# Patient Record
Sex: Female | Born: 1950 | Race: White | Hispanic: No | Smoking: Never smoker
Health system: Southern US, Community
[De-identification: ages and names within clinical notes are randomized; demographics above are authoritative.]

## PROBLEM LIST (undated history)

## (undated) DIAGNOSIS — G4730 Sleep apnea, unspecified: Secondary | ICD-10-CM

## (undated) DIAGNOSIS — Z974 Presence of external hearing-aid: Secondary | ICD-10-CM

## (undated) DIAGNOSIS — J302 Other seasonal allergic rhinitis: Secondary | ICD-10-CM

## (undated) DIAGNOSIS — D649 Anemia, unspecified: Secondary | ICD-10-CM

## (undated) DIAGNOSIS — I10 Essential (primary) hypertension: Secondary | ICD-10-CM

## (undated) DIAGNOSIS — E039 Hypothyroidism, unspecified: Secondary | ICD-10-CM

## (undated) DIAGNOSIS — R7303 Prediabetes: Secondary | ICD-10-CM

## (undated) DIAGNOSIS — M810 Age-related osteoporosis without current pathological fracture: Secondary | ICD-10-CM

## (undated) DIAGNOSIS — Z8489 Family history of other specified conditions: Secondary | ICD-10-CM

## (undated) DIAGNOSIS — Z8719 Personal history of other diseases of the digestive system: Secondary | ICD-10-CM

## (undated) DIAGNOSIS — M719 Bursopathy, unspecified: Secondary | ICD-10-CM

## (undated) DIAGNOSIS — K219 Gastro-esophageal reflux disease without esophagitis: Secondary | ICD-10-CM

## (undated) DIAGNOSIS — R0981 Nasal congestion: Secondary | ICD-10-CM

## (undated) DIAGNOSIS — Q348 Other specified congenital malformations of respiratory system: Secondary | ICD-10-CM

## (undated) DIAGNOSIS — Z972 Presence of dental prosthetic device (complete) (partial): Secondary | ICD-10-CM

## (undated) DIAGNOSIS — M1990 Unspecified osteoarthritis, unspecified site: Secondary | ICD-10-CM

## (undated) DIAGNOSIS — C801 Malignant (primary) neoplasm, unspecified: Secondary | ICD-10-CM

## (undated) HISTORY — PX: COLONOSCOPY: SHX174

## (undated) HISTORY — DX: Bursopathy, unspecified: M71.9

## (undated) HISTORY — DX: Other specified congenital malformations of respiratory system: Q34.8

## (undated) HISTORY — DX: Hypothyroidism, unspecified: E03.9

## (undated) HISTORY — DX: Other seasonal allergic rhinitis: J30.2

## (undated) HISTORY — PX: ESOPHAGOGASTRODUODENOSCOPY: SHX1529

## (undated) HISTORY — DX: Prediabetes: R73.03

---

## 1988-07-26 HISTORY — PX: KNEE ARTHROSCOPY: SUR90

## 1998-09-30 ENCOUNTER — Other Ambulatory Visit: Admission: RE | Admit: 1998-09-30 | Discharge: 1998-09-30 | Payer: Self-pay | Admitting: Obstetrics & Gynecology

## 1999-05-06 ENCOUNTER — Other Ambulatory Visit: Admission: RE | Admit: 1999-05-06 | Discharge: 1999-05-06 | Payer: Self-pay | Admitting: Radiology

## 1999-06-08 ENCOUNTER — Other Ambulatory Visit: Admission: RE | Admit: 1999-06-08 | Discharge: 1999-06-08 | Payer: Self-pay | Admitting: Obstetrics & Gynecology

## 1999-07-27 HISTORY — PX: KNEE ARTHROSCOPY W/ ACL RECONSTRUCTION: SHX1858

## 2000-07-13 ENCOUNTER — Other Ambulatory Visit: Admission: RE | Admit: 2000-07-13 | Discharge: 2000-07-13 | Payer: Self-pay | Admitting: Obstetrics & Gynecology

## 2000-07-15 ENCOUNTER — Ambulatory Visit (HOSPITAL_BASED_OUTPATIENT_CLINIC_OR_DEPARTMENT_OTHER): Admission: RE | Admit: 2000-07-15 | Discharge: 2000-07-15 | Payer: Self-pay | Admitting: Orthopedic Surgery

## 2000-07-26 HISTORY — PX: KNEE ARTHROSCOPY: SHX127

## 2000-09-08 ENCOUNTER — Ambulatory Visit (HOSPITAL_BASED_OUTPATIENT_CLINIC_OR_DEPARTMENT_OTHER): Admission: RE | Admit: 2000-09-08 | Discharge: 2000-09-09 | Payer: Self-pay | Admitting: Orthopedic Surgery

## 2001-08-02 ENCOUNTER — Other Ambulatory Visit: Admission: RE | Admit: 2001-08-02 | Discharge: 2001-08-02 | Payer: Self-pay | Admitting: Obstetrics & Gynecology

## 2002-04-27 ENCOUNTER — Encounter: Payer: Self-pay | Admitting: Family Medicine

## 2002-04-27 ENCOUNTER — Encounter: Admission: RE | Admit: 2002-04-27 | Discharge: 2002-04-27 | Payer: Self-pay | Admitting: Family Medicine

## 2002-07-26 DIAGNOSIS — C801 Malignant (primary) neoplasm, unspecified: Secondary | ICD-10-CM

## 2002-07-26 HISTORY — DX: Malignant (primary) neoplasm, unspecified: C80.1

## 2002-07-26 HISTORY — PX: SHOULDER SURGERY: SHX246

## 2002-07-26 HISTORY — PX: BONE RESECTION: SHX897

## 2002-08-07 ENCOUNTER — Other Ambulatory Visit: Admission: RE | Admit: 2002-08-07 | Discharge: 2002-08-07 | Payer: Self-pay | Admitting: Obstetrics & Gynecology

## 2003-08-12 ENCOUNTER — Other Ambulatory Visit: Admission: RE | Admit: 2003-08-12 | Discharge: 2003-08-12 | Payer: Self-pay | Admitting: Obstetrics & Gynecology

## 2004-09-01 ENCOUNTER — Other Ambulatory Visit: Admission: RE | Admit: 2004-09-01 | Discharge: 2004-09-01 | Payer: Self-pay | Admitting: Obstetrics & Gynecology

## 2006-07-26 HISTORY — PX: ANKLE ARTHROSCOPY: SUR85

## 2010-04-02 ENCOUNTER — Encounter: Admission: RE | Admit: 2010-04-02 | Discharge: 2010-04-02 | Payer: Self-pay | Admitting: Obstetrics & Gynecology

## 2010-12-11 NOTE — Op Note (Signed)
[place]  Patient:    Stacey Coleman, Stacey Coleman                       MRN: [id number] Proc. Date: 09/08/00 Adm. Date:  [id number] Attending:  Colbert Ewing                           Operative Report  PREOPERATIVE DIAGNOSIS:  Right knee anterior cruciate ligament tear with anterolateral rotary instability.  Avascular necrosis of medial femoral condyle, status post debridement and drilling.  POSTOPERATIVE DIAGNOSIS:  Right knee anterior cruciate ligament tear with anterolateral rotary instability.  Avascular necrosis of medial femoral condyle, status post debridement and drilling.  Probable cartilage ingrowth over area of drilling, medial femoral condyle.  OPERATION PERFORMED:  Right knee examination under anesthesia, arthroscopy, assessment of medial femoral condyle.  Arthroscopic endoscopic anterior cruciate ligament reconstruction with patellar tendon allograft, bone-tendon-bone with bioabsorbable screw fixation.  Notchplasty.  SURGEON:  Loreta Ave, M.D.  ASSISTANT:  Arlys John D. Petrarca, P.A.-C.  ANESTHESIA:  General.  ESTIMATED BLOOD LOSS:  Minimal.  TOURNIQUET TIME:  One hour and 10 minutes.  SPECIMENS:  None.  CULTURES:  None.  COMPLICATIONS:  None.  DRESSING:  Soft compressive with knee immobilizer.  DESCRIPTION OF PROCEDURE:  The patient was brought to the operating room and placed on the operating table in supine position.  After adequate anesthesia had been obtained, the right knee examined.  Full motion.  Positive Lachman, positive drawer, positive pivot-shift.  Collaterals and posterior cruciate ligament stable.  Good patellofemoral tracking without crepitus, tethering or instability.  Tourniquet applied, prepped and draped in the usual sterile fashion. Exsanguinated with elevation and Esmarch.  Tourniquet inflated to 300 mmHg.  Three portals created.  One superolateral, one each medial and lateral parapatellar.   Inflow catheter introduced, knee distended. Arthroscope introduced.  Knee inspected.  Area of microfracture in the medial femoral condyle had filled in nicely with fibrocartilage.  No new chondral fragmentation.  Medial meniscus, lateral meniscus without change after previous meniscectomy.  ACL completely torn.  The stump of the ACL healed over the PCL.  Good patellofemoral tracking with only grade 2 chondromalacia. After all matters were assessed, a graft was prepared for 10 mm tunnels with bone pegs at either end, central portion of the graft tube with Vicryl and either end tagged with #5 Ethibond.  With the arthroscope in place, the ACL debrided.  Notchplasty with shaver and high speed bur.  Tibial guide inserted through the medial portal on the ACL footprint on the tibia.  The other end through a small vertical incision medial to the tibial tubercle.  K-wire driven and placement felt to be good.  Overdrilled with a 10 mm reamer. Debris cleared with a shaver.  Femoral guide inserted across the tibial tunnel notch and on the back cortex of the femur.  K-wire driven and overdrilled with the reamer for appropriate depth for the graft and the pegs.  Both tunnels assessed and found to be in good position.  Debris cleared from all recesses. Two pin passer inserted across both tunnels and out through a stab wound anterolateral thigh.  Nitinol wire brought into the medial portal and out through the femoral tunnel.  Graft attached with a two-pin passer, pulled in across the knee, seating the pegs well in the tibia and in the femur.  Fixed in the femur with a 9 x 25 bioabsorbable  screw which was placed over a nitinol wire parallel to the ____________ femur.  Excellent capturing and fixation. Nitinol wires and sutures removed.  Knee placed in 90 degrees of flexion, posterior drawer applied.  Graft pulled tightly and fixed into the tibial tunnel over a nitinol wire with another 9 x 25 bioabsorbable  screw.  At completion, excellent stability.  Full motion.  No impingement of the graft through full motion.  Negative Lachman, negative drawer.  Wounds were all irrigated.  All nitinol wire and sutures had been removed.  Wounds closed with subcutaneous and subcuticular Vicryl, injected with Marcaine.  Portals also closed with nylon.  Margins of the wound and knee injected with Marcaine. Sterile compressive dressing applied.  Tourniquet deflated and removed.  Knee immobilizer applied.  Anesthesia reversed.  Brought to recovery room. Tolerated surgery well.  No complications. DD:  09/08/00 TD:  09/08/00 Job: [id number]

## 2010-12-11 NOTE — Op Note (Signed)
[place]  Patient:    Stacey Coleman, Stacey Coleman                       MRN: [id number] Proc. Date: 07/15/00 Adm. Date:  [id number] Disc. Date: [id number] Attending:  Colbert Ewing                           Operative Report  PREOPERATIVE DIAGNOSIS:  Right knee spontaneous osteonecrosis, medial femoral condyle.  Torn anterior cruciate ligament.  POSTOPERATIVE DIAGNOSIS:  Right knee spontaneous osteonecrosis, medial femoral condyle.  Torn anterior cruciate ligament.  OPERATIVE PROCEDURE:  Right knee examination under anesthesia to assess anterior cruciate ligament instability.  Arthroscopy with debridement of area of osteonecrosis, medial femoral condyle, and treatment with multiple drilling/microfracturing.  SURGEON:  Loreta Ave, M.D.  ASSISTANT:  Arlys John D. Petrarca, P.A.-C.  ANESTHESIA:  General.  ESTIMATED BLOOD LOSS:  Minimal.  Tourniquet not employed.  SPECIMENS:  None.  CULTURES:  None.  COMPLICATIONS:  None.  DRESSINGS:  Soft compressive.  DESCRIPTION OF PROCEDURE:  The patient was brought to the operating room and after adequate anesthesia had been obtained, the right knee was examined. Positive Lachmans, positive drawer, and positive pivot shift.  Collaterals stable.  PCL stable.  Good patellar femoral stability with slightly lateral tracking but no tethering.  Tourniquet and leg holder applied.  Leg prepped and draped in the usual sterile fashion.  Three portals created:  one supralateral, one each medial and lateral parapatellar.  ______ introduced. Knee distended.  Arthroscope was introduced.  Knee inspected.  Patellar femoral joint and lateral tibial plateau had some Grade 2 changes.  Lateral tracking patellar femoral short without tethering.  Lateral meniscus intact. Lateral femoral condyle normal.  Marked stretching attenuation of ACL with a functional complete tear with instability.  PCL intact.  Reactive  synovitis anteriorly, debrided.  Medial component revealed a 1.5-cm lesion of full-thickness osteonecrosis with marked instability of osteochondral fragments within the bed.  The bed completely removed, and the cartilage debrided back to a stable surface at the margins.  Some Grade 2, mild Grade 3 chondromalacia on the back of the medial femoral condyle, also debrided smoothly.  Once the bed had been completely cleared down, it was treated with multiple microfracturing with the microfracturing set to produce bleeding and filling in of the lesion with fibrocartilage.  All debrided clear.  The entire knee examined.  No other significant findings appreciated.  Instruments were removed.  Portals in the knee were injected with Marcaine.  Portals were closed with #0 nylon.  Sterile compressive dressing was applied.  Anesthesia was reversed and brought to recovery room.  Tolerated the surgery well.  No complications. DD:  07/15/00 TD:  07/17/00 Job: [id number]

## 2011-03-10 ENCOUNTER — Other Ambulatory Visit: Payer: Self-pay | Admitting: Obstetrics & Gynecology

## 2011-03-10 DIAGNOSIS — Z1231 Encounter for screening mammogram for malignant neoplasm of breast: Secondary | ICD-10-CM

## 2011-04-05 ENCOUNTER — Ambulatory Visit
Admission: RE | Admit: 2011-04-05 | Discharge: 2011-04-05 | Disposition: A | Discharge status: Home / Self Care | Payer: BC Managed Care – PPO | Source: Ambulatory Visit | Attending: Obstetrics & Gynecology | Admitting: Obstetrics & Gynecology

## 2011-04-05 DIAGNOSIS — Z1231 Encounter for screening mammogram for malignant neoplasm of breast: Secondary | ICD-10-CM

## 2011-07-27 DIAGNOSIS — D649 Anemia, unspecified: Secondary | ICD-10-CM

## 2011-07-27 HISTORY — DX: Anemia, unspecified: D64.9

## 2012-02-29 ENCOUNTER — Other Ambulatory Visit: Payer: Self-pay | Admitting: Obstetrics & Gynecology

## 2012-02-29 DIAGNOSIS — Z1231 Encounter for screening mammogram for malignant neoplasm of breast: Secondary | ICD-10-CM

## 2012-04-06 ENCOUNTER — Ambulatory Visit
Admission: RE | Admit: 2012-04-06 | Discharge: 2012-04-06 | Disposition: A | Discharge status: Home / Self Care | Payer: BC Managed Care – PPO | Source: Ambulatory Visit | Attending: Obstetrics & Gynecology | Admitting: Obstetrics & Gynecology

## 2012-04-06 ENCOUNTER — Ambulatory Visit: Payer: BC Managed Care – PPO

## 2012-04-06 DIAGNOSIS — Z1231 Encounter for screening mammogram for malignant neoplasm of breast: Secondary | ICD-10-CM

## 2013-02-26 ENCOUNTER — Other Ambulatory Visit: Payer: Self-pay

## 2013-03-01 ENCOUNTER — Other Ambulatory Visit: Payer: Self-pay | Admitting: *Deleted

## 2013-12-04 ENCOUNTER — Other Ambulatory Visit: Payer: Self-pay | Admitting: Orthopedic Surgery

## 2013-12-04 DIAGNOSIS — M25562 Pain in left knee: Secondary | ICD-10-CM

## 2013-12-05 ENCOUNTER — Other Ambulatory Visit: Payer: Self-pay | Admitting: Physician Assistant

## 2013-12-07 ENCOUNTER — Encounter (HOSPITAL_BASED_OUTPATIENT_CLINIC_OR_DEPARTMENT_OTHER): Payer: Self-pay | Admitting: *Deleted

## 2013-12-07 NOTE — Progress Notes (Signed)
Will come in for bmet-ekg-had had knee surgeries here in past-to bring crutches

## 2013-12-08 ENCOUNTER — Ambulatory Visit
Admission: RE | Admit: 2013-12-08 | Discharge: 2013-12-08 | Disposition: A | Discharge status: Home / Self Care | Payer: BC Managed Care – PPO | Source: Ambulatory Visit | Attending: Orthopedic Surgery | Admitting: Orthopedic Surgery

## 2013-12-08 DIAGNOSIS — M25562 Pain in left knee: Secondary | ICD-10-CM

## 2013-12-10 ENCOUNTER — Encounter (HOSPITAL_BASED_OUTPATIENT_CLINIC_OR_DEPARTMENT_OTHER)
Admission: RE | Admit: 2013-12-10 | Discharge: 2013-12-10 | Disposition: A | Discharge status: Home / Self Care | Payer: BC Managed Care – PPO | Source: Ambulatory Visit | Attending: Orthopedic Surgery | Admitting: Orthopedic Surgery

## 2013-12-10 ENCOUNTER — Other Ambulatory Visit: Payer: Self-pay

## 2013-12-10 LAB — BASIC METABOLIC PANEL
BUN: 11 mg/dL (ref 6–23)
CO2: 26 mEq/L (ref 19–32)
Calcium: 9.6 mg/dL (ref 8.4–10.5)
Chloride: 105 mEq/L (ref 96–112)
Creatinine, Ser: 0.76 mg/dL (ref 0.50–1.10)
GFR calc Af Amer: >90 mL/min (ref >90)
GFR calc non Af Amer: 87 mL/min — ABNORMAL LOW (ref >90)
Glucose, Bld: 114 mg/dL — ABNORMAL HIGH (ref 70–99)
Potassium: 3.9 mEq/L (ref 3.7–5.3)
Sodium: 141 mEq/L (ref 137–147)

## 2013-12-12 NOTE — H&P (Signed)
[facility] [address] [place], [place] [address] [phone] A Division of [facility]  Ninetta Lights, M.D.   Robert A. Noemi Chapel, M.D.   Faythe Casa, M.D.   Johnny Bridge, M.D.   Almedia Balls, M.D Ernesta Amble. Percell Miller, M.D.  Joseph Pierini, M.D.  Lanier Prude, M.D.    Verner Chol, M.D. Mary L. Fenton Malling, PA-C  Kirstin A. Shepperson, PA-C  Josh [place], PA-C [place], [place]   RE: Stacey Coleman, Stacey Coleman                                [id number]      DOB: [birth year] PROGRESS NOTE: 12-04-13 [name] comes in for a new acute injury to her left knee.  She had climbed up on her bed, was reaching to adjust a drape.  A forced flexion twisting injury to her knee.  She had acute pain on her medial side.  The knee locked up and she could not straighten this out.  With time and maneuvering her leg she was eventually able to get this unlocked so that she could go into extension.  She then subsequently had a mild twist and the knee locked up again.  That took a number of hours before she was able to maneuver that and then straighten her knee out again.  She currently can bear weight.  She can come to full extension, but she is very hesitant to do anything.  Significant pain on the medial side.  No previous issues with this knee.   Remaining history is reviewed, updated and included in the chart.  Of note, she has extreme end stage changes in her right knee.  She is going to come down to knee replacement there sooner rather than later.  As she is still tolerating things on that side we have been waiting for it to be intolerable before proceeding.     EXAMINATION: General exam is outlined and included in the chart.  Specifically, she has an antalgic gait, both sides at this point in time.  Her right knee I can still get to full extension and better than 100 degrees of flexion.  Marked grating and crepitus throughout.  Stable  ligaments.  On the left she is exquisitely tender medial joint line.  Positive McMurray's.  I did not stress this forcefully because of the two episodes of locking.  Ligaments are stable.  2+ effusion.  Neurovascularly intact distally.    X-RAYS: Four view standing x-ray shows the extreme tricompartmental changes on the right.  On the left there is really a paucity of degenerative changes.    DISPOSITION:  In light of this dramatic episode of locking x 2, I think we need to assess her for a displaced bucket handle tear medial meniscus.  Therapy and injection at this point in time is futile based on these episodes of locking that lasted for hours.  MRI complete.  If in fact she has a bucket handle tear it is going to come down to arthroscopic assessment and debridement.  We have discussed that procedure, recovery and rehab, risks, benefits and complications in detail.  We are going to make a final decision about proceeding after I see her scan.  More than 25 minutes spent face-to-face covering all of this with her.  Again, we will be talking on the phone after her scan.  Ninetta Lights, M.D.   Electronically verified by Ninetta Lights, M.D. DFM:jjh D 12-04-13 T 12-05-13

## 2013-12-13 ENCOUNTER — Encounter (HOSPITAL_BASED_OUTPATIENT_CLINIC_OR_DEPARTMENT_OTHER)
Admission: RE | Disposition: A | Discharge status: Home / Self Care | Payer: Self-pay | Source: Ambulatory Visit | Attending: Orthopedic Surgery

## 2013-12-13 ENCOUNTER — Ambulatory Visit (HOSPITAL_BASED_OUTPATIENT_CLINIC_OR_DEPARTMENT_OTHER)
Admission: RE | Admit: 2013-12-13 | Discharge: 2013-12-13 | Disposition: A | Discharge status: Home / Self Care | Payer: BC Managed Care – PPO | Source: Ambulatory Visit | Attending: Orthopedic Surgery | Admitting: Orthopedic Surgery

## 2013-12-13 ENCOUNTER — Encounter (HOSPITAL_BASED_OUTPATIENT_CLINIC_OR_DEPARTMENT_OTHER): Payer: BC Managed Care – PPO | Admitting: Anesthesiology

## 2013-12-13 ENCOUNTER — Encounter (HOSPITAL_BASED_OUTPATIENT_CLINIC_OR_DEPARTMENT_OTHER): Payer: Self-pay | Admitting: Anesthesiology

## 2013-12-13 ENCOUNTER — Ambulatory Visit (HOSPITAL_BASED_OUTPATIENT_CLINIC_OR_DEPARTMENT_OTHER): Payer: BC Managed Care – PPO | Admitting: Anesthesiology

## 2013-12-13 DIAGNOSIS — K219 Gastro-esophageal reflux disease without esophagitis: Secondary | ICD-10-CM | POA: Insufficient documentation

## 2013-12-13 DIAGNOSIS — IMO0002 Reserved for concepts with insufficient information to code with codable children: Secondary | ICD-10-CM | POA: Insufficient documentation

## 2013-12-13 DIAGNOSIS — E039 Hypothyroidism, unspecified: Secondary | ICD-10-CM | POA: Insufficient documentation

## 2013-12-13 DIAGNOSIS — I10 Essential (primary) hypertension: Secondary | ICD-10-CM | POA: Insufficient documentation

## 2013-12-13 DIAGNOSIS — X500XXA Overexertion from strenuous movement or load, initial encounter: Secondary | ICD-10-CM | POA: Insufficient documentation

## 2013-12-13 HISTORY — DX: Hypothyroidism, unspecified: E03.9

## 2013-12-13 HISTORY — DX: Essential (primary) hypertension: I10

## 2013-12-13 HISTORY — PX: KNEE ARTHROSCOPY WITH MEDIAL MENISECTOMY: SHX5651

## 2013-12-13 HISTORY — DX: Age-related osteoporosis without current pathological fracture: M81.0

## 2013-12-13 HISTORY — DX: Presence of dental prosthetic device (complete) (partial): Z97.2

## 2013-12-13 HISTORY — DX: Unspecified osteoarthritis, unspecified site: M19.90

## 2013-12-13 HISTORY — DX: Presence of external hearing-aid: Z97.4

## 2013-12-13 HISTORY — DX: Gastro-esophageal reflux disease without esophagitis: K21.9

## 2013-12-13 LAB — POCT HEMOGLOBIN-HEMACUE: Hemoglobin: 13.8 g/dL (ref 12.0–15.0)

## 2013-12-13 SURGERY — ARTHROSCOPY, KNEE, WITH MEDIAL MENISCECTOMY
Anesthesia: General | Site: Knee | Laterality: Left

## 2013-12-13 MED ORDER — HYDROMORPHONE HCL PF 1 MG/ML IJ SOLN
0.2500 mg | INTRAMUSCULAR | Status: DC | PRN
  Administered 2013-12-13 (×2): 0.5 mg via INTRAVENOUS

## 2013-12-13 MED ORDER — OXYCODONE-ACETAMINOPHEN 5-325 MG PO TABS: 1.0000 | ORAL_TABLET | ORAL | Status: DC | PRN

## 2013-12-13 MED ORDER — LACTATED RINGERS IV SOLN
INTRAVENOUS | Status: DC
  Administered 2013-12-13 (×2): via INTRAVENOUS

## 2013-12-13 MED ORDER — BUPIVACAINE HCL (PF) 0.5 % IJ SOLN
INTRAMUSCULAR | Status: AC
  Filled 2013-12-13: qty 30

## 2013-12-13 MED ORDER — HYDROMORPHONE HCL PF 1 MG/ML IJ SOLN
INTRAMUSCULAR | Status: AC
  Filled 2013-12-13: qty 1

## 2013-12-13 MED ORDER — MIDAZOLAM HCL 2 MG/2ML IJ SOLN: 1.0000 mg | INTRAMUSCULAR | Status: DC | PRN

## 2013-12-13 MED ORDER — OXYCODONE HCL 5 MG/5ML PO SOLN: 5.0000 mg | Freq: Once | ORAL | Status: AC | PRN

## 2013-12-13 MED ORDER — BUPIVACAINE HCL (PF) 0.25 % IJ SOLN
INTRAMUSCULAR | Status: AC
  Filled 2013-12-13: qty 30

## 2013-12-13 MED ORDER — CEFAZOLIN SODIUM-DEXTROSE 2-3 GM-% IV SOLR
2.0000 g | INTRAVENOUS | Status: AC
  Administered 2013-12-13: 2 g via INTRAVENOUS

## 2013-12-13 MED ORDER — DEXAMETHASONE SODIUM PHOSPHATE 4 MG/ML IJ SOLN
INTRAMUSCULAR | Status: DC | PRN
  Administered 2013-12-13: 10 mg via INTRAVENOUS

## 2013-12-13 MED ORDER — METHYLPREDNISOLONE ACETATE 80 MG/ML IJ SUSP
INTRAMUSCULAR | Status: AC
  Filled 2013-12-13: qty 1

## 2013-12-13 MED ORDER — FENTANYL CITRATE 0.05 MG/ML IJ SOLN: 50.0000 ug | INTRAMUSCULAR | Status: DC | PRN

## 2013-12-13 MED ORDER — PROPOFOL 10 MG/ML IV BOLUS
INTRAVENOUS | Status: DC | PRN
  Administered 2013-12-13: 50 mg via INTRAVENOUS
  Administered 2013-12-13: 150 mg via INTRAVENOUS

## 2013-12-13 MED ORDER — LACTATED RINGERS IV SOLN: INTRAVENOUS | Status: DC

## 2013-12-13 MED ORDER — BUPIVACAINE HCL (PF) 0.5 % IJ SOLN
INTRAMUSCULAR | Status: DC | PRN
  Administered 2013-12-13: 20 mL via INTRA_ARTICULAR

## 2013-12-13 MED ORDER — CEFAZOLIN SODIUM-DEXTROSE 2-3 GM-% IV SOLR
INTRAVENOUS | Status: AC
  Filled 2013-12-13: qty 50

## 2013-12-13 MED ORDER — MIDAZOLAM HCL 5 MG/5ML IJ SOLN
INTRAMUSCULAR | Status: DC | PRN
  Administered 2013-12-13: 2 mg via INTRAVENOUS

## 2013-12-13 MED ORDER — ONDANSETRON HCL 4 MG/2ML IJ SOLN
INTRAMUSCULAR | Status: DC | PRN
  Administered 2013-12-13: 4 mg via INTRAVENOUS

## 2013-12-13 MED ORDER — BUPIVACAINE HCL 0.25 % IJ SOLN
INTRAMUSCULAR | Status: DC | PRN
  Administered 2013-12-13: 10:00:00 via INTRA_ARTICULAR

## 2013-12-13 MED ORDER — FENTANYL CITRATE 0.05 MG/ML IJ SOLN
INTRAMUSCULAR | Status: AC
  Filled 2013-12-13: qty 4

## 2013-12-13 MED ORDER — CHLORHEXIDINE GLUCONATE 4 % EX LIQD: 60.0000 mL | Freq: Once | CUTANEOUS | Status: DC

## 2013-12-13 MED ORDER — OXYCODONE HCL 5 MG PO TABS
5.0000 mg | ORAL_TABLET | Freq: Once | ORAL | Status: AC | PRN
  Administered 2013-12-13: 5 mg via ORAL

## 2013-12-13 MED ORDER — SODIUM CHLORIDE 0.9 % IR SOLN
Status: DC | PRN
  Administered 2013-12-13: 3000 mL

## 2013-12-13 MED ORDER — OXYCODONE HCL 5 MG PO TABS
ORAL_TABLET | ORAL | Status: AC
  Filled 2013-12-13: qty 1

## 2013-12-13 MED ORDER — ONDANSETRON HCL 4 MG PO TABS: 4.0000 mg | ORAL_TABLET | Freq: Three times a day (TID) | ORAL | Status: DC | PRN

## 2013-12-13 MED ORDER — MIDAZOLAM HCL 2 MG/2ML IJ SOLN
INTRAMUSCULAR | Status: AC
  Filled 2013-12-13: qty 2

## 2013-12-13 MED ORDER — FENTANYL CITRATE 0.05 MG/ML IJ SOLN
INTRAMUSCULAR | Status: DC | PRN
  Administered 2013-12-13: 50 ug via INTRAVENOUS
  Administered 2013-12-13: 100 ug via INTRAVENOUS

## 2013-12-13 MED ORDER — LIDOCAINE HCL (CARDIAC) 20 MG/ML IV SOLN
INTRAVENOUS | Status: DC | PRN
  Administered 2013-12-13: 50 mg via INTRAVENOUS

## 2013-12-13 SURGICAL SUPPLY — 42 items
BANDAGE ELASTIC 6 VELCRO ST LF (GAUZE/BANDAGES/DRESSINGS) ×2 IMPLANT
BLADE CUDA 5.5 (BLADE) IMPLANT
BLADE CUDA GRT WHITE 3.5 (BLADE) IMPLANT
BLADE CUTTER GATOR 3.5 (BLADE) ×2 IMPLANT
BLADE CUTTER MENIS 5.5 (BLADE) IMPLANT
BLADE GREAT WHITE 4.2 (BLADE) ×2 IMPLANT
BUR OVAL 4.0 (BURR) IMPLANT
CANISTER SUCT 3000ML (MISCELLANEOUS) IMPLANT
CUTTER MENISCUS  4.2MM (BLADE)
CUTTER MENISCUS 4.2MM (BLADE) IMPLANT
DRAPE ARTHROSCOPY W/POUCH 90 (DRAPES) ×2 IMPLANT
DURAPREP 26ML APPLICATOR (WOUND CARE) ×2 IMPLANT
ELECT MENISCUS 165MM 90D (ELECTRODE) ×1 IMPLANT
ELECT REM PT RETURN 9FT ADLT (ELECTROSURGICAL) ×2
ELECTRODE REM PT RTRN 9FT ADLT (ELECTROSURGICAL) IMPLANT
GAUZE SPONGE 4X4 12PLY STRL (GAUZE/BANDAGES/DRESSINGS) ×4 IMPLANT
GAUZE XEROFORM 1X8 LF (GAUZE/BANDAGES/DRESSINGS) ×2 IMPLANT
GLOVE BIO SURGEON STRL SZ 6.5 (GLOVE) ×2 IMPLANT
GLOVE BIOGEL PI IND STRL 6.5 (GLOVE) IMPLANT
GLOVE BIOGEL PI IND STRL 7.0 (GLOVE) ×1 IMPLANT
GLOVE BIOGEL PI INDICATOR 6.5 (GLOVE) ×1
GLOVE BIOGEL PI INDICATOR 7.0 (GLOVE) ×2
GLOVE ECLIPSE 6.5 STRL STRAW (GLOVE) ×2 IMPLANT
GLOVE EXAM NITRILE LRG STRL (GLOVE) ×1 IMPLANT
GLOVE ORTHO TXT STRL SZ7.5 (GLOVE) ×2 IMPLANT
GOWN STRL REUS W/ TWL LRG LVL3 (GOWN DISPOSABLE) ×2 IMPLANT
GOWN STRL REUS W/ TWL XL LVL3 (GOWN DISPOSABLE) ×1 IMPLANT
GOWN STRL REUS W/TWL LRG LVL3 (GOWN DISPOSABLE) ×8
GOWN STRL REUS W/TWL XL LVL3 (GOWN DISPOSABLE)
HOLDER KNEE FOAM BLUE (MISCELLANEOUS) ×2 IMPLANT
IV NS IRRIG 3000ML ARTHROMATIC (IV SOLUTION) ×3 IMPLANT
KNEE WRAP E Z 3 GEL PACK (MISCELLANEOUS) ×2 IMPLANT
MANIFOLD NEPTUNE II (INSTRUMENTS) ×2 IMPLANT
PACK ARTHROSCOPY DSU (CUSTOM PROCEDURE TRAY) ×2 IMPLANT
PACK BASIN DAY SURGERY FS (CUSTOM PROCEDURE TRAY) ×2 IMPLANT
PENCIL BUTTON HOLSTER BLD 10FT (ELECTRODE) ×1 IMPLANT
SET ARTHROSCOPY TUBING (MISCELLANEOUS) ×2
SET ARTHROSCOPY TUBING LN (MISCELLANEOUS) ×1 IMPLANT
SUT ETHILON 3 0 PS 1 (SUTURE) ×2 IMPLANT
SUT VIC AB 3-0 FS2 27 (SUTURE) IMPLANT
TOWEL OR 17X24 6PK STRL BLUE (TOWEL DISPOSABLE) ×2 IMPLANT
WATER STERILE IRR 1000ML POUR (IV SOLUTION) ×2 IMPLANT

## 2013-12-13 NOTE — Anesthesia Procedure Notes (Signed)
Procedure Name: LMA Insertion Date/Time: 12/13/2013 9:49 AM Performed by: Lieutenant Diego Pre-anesthesia Checklist: Patient identified, Emergency Drugs available, Suction available and Patient being monitored Patient Re-evaluated:Patient Re-evaluated prior to inductionOxygen Delivery Method: Circle System Utilized Preoxygenation: Pre-oxygenation with 100% oxygen Intubation Type: IV induction Ventilation: Mask ventilation without difficulty LMA: LMA inserted LMA Size: 4.0 Number of attempts: 1 Airway Equipment and Method: bite block Placement Confirmation: positive ETCO2 and breath sounds checked- equal and bilateral Tube secured with: Tape Dental Injury: Teeth and Oropharynx as per pre-operative assessment

## 2013-12-13 NOTE — Interval H&P Note (Signed)
History and Physical Interval Note:  12/13/2013 7:32 AM  Stacey Coleman  has presented today for surgery, with the diagnosis of left knee: Tear meniscus medial knee  The various methods of treatment have been discussed with the patient and family. After consideration of risks, benefits and other options for treatment, the patient has consented to  Procedure(s): LEFT KNEE ARTHROSCOPY WITH MEDIAL MENISECTOMY (Left) as a surgical intervention .  The patient's history has been reviewed, patient examined, no change in status, stable for surgery.  I have reviewed the patient's chart and labs.  Questions were answered to the patient's satisfaction.     Ninetta Lights

## 2013-12-13 NOTE — Anesthesia Preprocedure Evaluation (Addendum)
Anesthesia Evaluation  Patient identified by MRN, date of birth, ID band Patient awake    Reviewed: Allergy & Precautions, H&P , NPO status , Patient's Chart, lab work & pertinent test results  Airway Mallampati: II TM Distance: >3 FB Neck ROM: Full    Dental no notable dental hx. (+) Teeth Intact, Dental Advisory Given   Pulmonary neg pulmonary ROS,  breath sounds clear to auscultation  Pulmonary exam normal       Cardiovascular hypertension, On Medications negative cardio ROS  Rhythm:Regular Rate:Normal     Neuro/Psych negative neurological ROS  negative psych ROS   GI/Hepatic negative GI ROS, Neg liver ROS, GERD-  Medicated and Controlled,  Endo/Other  negative endocrine ROSHypothyroidism   Renal/GU negative Renal ROS  negative genitourinary   Musculoskeletal   Abdominal   Peds  Hematology negative hematology ROS (+)   Anesthesia Other Findings   Reproductive/Obstetrics negative OB ROS                         Anesthesia Physical Anesthesia Plan  ASA: II  Anesthesia Plan: General   Post-op Pain Management:    Induction: Intravenous  Airway Management Planned: LMA  Additional Equipment:   Intra-op Plan:   Post-operative Plan: Extubation in OR  Informed Consent: I have reviewed the patients History and Physical, chart, labs and discussed the procedure including the risks, benefits and alternatives for the proposed anesthesia with the patient or authorized representative who has indicated his/her understanding and acceptance.   Dental advisory given  Plan Discussed with: CRNA  Anesthesia Plan Comments:         Anesthesia Quick Evaluation

## 2013-12-13 NOTE — Anesthesia Postprocedure Evaluation (Signed)
Anesthesia Post Note  Patient: Stacey Coleman  Procedure(s) Performed: Procedure(s) (LRB): LEFT KNEE ARTHROSCOPY WITH MEDIAL MENISECTOMY, PLICA EXCISION, CHONDROPLASTY (Left)  Anesthesia type: general  Patient location: PACU  Post pain: Pain level controlled  Post assessment: Patient's Cardiovascular Status Stable  Last Vitals:  Filed Vitals:   12/13/13 1115  BP: 126/68  Pulse: 75  Temp:   Resp: 9    Post vital signs: Reviewed and stable  Level of consciousness: sedated  Complications: No apparent anesthesia complications

## 2013-12-13 NOTE — Discharge Instructions (Signed)
Arthroscopic Procedure, Knee, Care After Refer to this sheet in the next few weeks. These discharge instructions provide you with general information on caring for yourself after you leave the hospital. Your health care provider may also give you specific instructions. Your treatment has been planned according to the most current medical practices available, but unavoidable complications sometimes occur. If you have any problems or questions after discharge, please call your health care provider. HOME CARE INSTRUCTIONS   Weight bearing as tolerated.  May remove bandages and replace with band-aids in 3 days.  May shower on Sunday but do not soak incisions.  May apply ice for up to 20 minutes at a time for pain and swelling.  Follow up appointment in one week.    It is normal to be sore for a couple days after surgery. See your health care provider if this seems to be getting worse rather than better.  Only take over-the-counter or prescription medicines for pain, discomfort, or fever as directed by your health care provider.  Take showers rather than baths, or as directed by your health care provider.  Change bandages (dressings) if necessary or as directed.  You may resume normal diet and activities as directed or allowed.  Avoid lifting and driving until you are directed otherwise.  Make an appointment to see your health care provider for stitches (suture) or staple removal as directed.  You may put ice on the area.  Put the ice in a plastic bag. Place a towel between your skin and the bag.  Leave the ice on for 15 20 minutes, three to four times per day for the first 2 days.  Elevate the knee above the level of your heart to reduce swelling, and avoid dangling the leg.  Do 10-15 ankle pumps (pointing your toes toward you and then away from you) two to three times daily.  If you are given compression stockings to wear after surgery, use them for as long as your surgeon tells you  (around 10-14 days).  Avoid smoking and exposure to second-hand smoke. SEEK MEDICAL CARE IF:   You have increased bleeding from your wounds.  You see redness or swelling or you have increasing pain in your wounds.  You have pus coming from your wound.  You have a fever or persistent symptoms for more than 2 3 days.  You notice a bad smell coming from the wound or dressing.  You have severe pain with any motion of your knee. SEEK IMMEDIATE MEDICAL CARE IF:   You develop a rash.  You have difficulty breathing.  You develop any reaction or side effects to medicines taken.  You develop pain in the calves or back of the knee.  You develop chest pain, shortness of breath, or difficulty breathing.  You develop numbness or tingling in the leg or foot. MAKE SURE YOU:   Understand these instructions.  Will watch your condition.  Will get help right away if you are not doing well or you get worse. Document Released: 01/29/2005 Document Revised: 03/14/2013 Document Reviewed: 12/07/2012 [facility] Patient Information 2014 [place], [place].     Post Anesthesia Home Care Instructions  Activity: Get plenty of rest for the remainder of the day. A responsible adult should stay with you for 24 hours following the procedure.  For the next 24 hours, DO NOT: -Drive a car -Paediatric nurse -Drink alcoholic beverages -Take any medication unless instructed by your physician -Make any legal decisions or sign important papers.  Meals:  Start with liquid foods such as gelatin or soup. Progress to regular foods as tolerated. Avoid greasy, spicy, heavy foods. If nausea and/or vomiting occur, drink only clear liquids until the nausea and/or vomiting subsides. Call your physician if vomiting continues.  Special Instructions/Symptoms: Your throat may feel dry or sore from the anesthesia or the breathing tube placed in your throat during surgery. If this causes discomfort, gargle with warm salt  water. The discomfort should disappear within 24 hours.   Call your surgeon if you experience:   1.  Fever over 101.0. 2.  Inability to urinate. 3.  Nausea and/or vomiting. 4.  Extreme swelling or bruising at the surgical site. 5.  Continued bleeding from the incision. 6.  Increased pain, redness or drainage from the incision. 7.  Problems related to your pain medication.

## 2013-12-13 NOTE — Transfer of Care (Signed)
Immediate Anesthesia Transfer of Care Note  Patient: Stacey Coleman  Procedure(s) Performed: Procedure(s): LEFT KNEE ARTHROSCOPY WITH MEDIAL MENISECTOMY, PLICA EXCISION, CHONDROPLASTY (Left)  Patient Location: PACU  Anesthesia Type:General  Level of Consciousness: sedated  Airway & Oxygen Therapy: Patient Spontanous Breathing and Patient connected to face mask oxygen  Post-op Assessment: Report given to PACU RN and Post -op Vital signs reviewed and stable  Post vital signs: Reviewed and stable  Complications: No apparent anesthesia complications

## 2013-12-14 ENCOUNTER — Encounter (HOSPITAL_BASED_OUTPATIENT_CLINIC_OR_DEPARTMENT_OTHER): Payer: Self-pay | Admitting: Orthopedic Surgery

## 2013-12-14 NOTE — Op Note (Signed)
NAMEVANITY, Stacey Coleman                 ACCOUNT NO.:  [id number]  MEDICAL RECORD NO.:  [id number]  LOCATION:                                 FACILITY:  PHYSICIAN:  Ninetta Lights, M.D. DATE OF BIRTH:  [birth year]  DATE OF PROCEDURE:  12/13/2013 DATE OF DISCHARGE:  12/13/2013                              OPERATIVE REPORT   PREOPERATIVE DIAGNOSIS:  Left knee medial meniscus tear.  POSTOPERATIVE DIAGNOSIS:  Left knee complex tearing posterior third medial meniscus.  Large torn medial fibrotic plica with a bucket-handle tear.  Superolateral plica.  Grade 3 and 4 chondral changes patellofemoral joint lateral trochlea with grade 2 and 3 changes medial femoral condyle.  PROCEDURE:  Left knee exam under anesthesia, arthroscopy.  Debridement of medial meniscus.  Resection of the torn medial plica and the superolateral plica.  Chondroplasty patellofemoral joint, medial femoral condyle.  SURGEON:  Ninetta Lights, M.D.  ASSISTANT:  Doran Stabler, PA  ANESTHESIA:  General.  BLOOD LOSS:  Minimal.  SPECIMENS:  None.  CULTURES:  None.  COMPLICATIONS:  None.  DRESSINGS:  Soft compressive.  PROCEDURE:  The patient was brought to the operating room, placed on the operating table in a supine position.  After adequate anesthesia had been obtained, leg holder applied.  Leg prepped and draped in usual sterile fashion.  Two portals, one each medial and lateral parapatellar. Arthroscope was introduced.  Knee distended and inspected.  In full extension, patellofemoral joint did not look bad.  Large fibrotic medial plica with a bucket-handle tear, this interposed over the patellofemoral joint accounting for locking.  This was completely resected.  Hemostasis with cautery.  With further flexion, there was a lot more change in the trochlea grade 4 over most of the lateral trochlea.  Chondroplasty of the margins.  Some lateral tracking.  A band of superolateral plica was then resected improving  the tethering that she had, which was mild. Thorough chondroplasty.  Lateral meniscus, lateral compartment, cruciate ligaments looked good.  Medially, there was intrameniscal tearing posterior third taken out to a stable rim, tapered in smoothly.  Some focal grade 2 to 3 changes on the condyle.  Relatively small area debrided.  Entire knee examined. No other findings were appreciated.  Instruments were removed.  Portals were closed with nylon.  Knee injected with Depo-Medrol and Marcaine. Anesthesia reversed.  Brought to the recovery room.  Tolerated the surgery well.  No complications.     Ninetta Lights, M.D.     DFM/MEDQ  D:  12/13/2013  T:  12/14/2013  Job:  [id number]

## 2014-08-09 ENCOUNTER — Other Ambulatory Visit: Payer: Self-pay | Admitting: Physician Assistant

## 2014-08-09 NOTE — H&P (Signed)
TOTAL KNEE ADMISSION H&P  Patient is being admitted for right total knee arthroplasty.  Subjective:  Chief Complaint:right knee pain.  HPI: Stacey Coleman, 64 y.o. female, has a history of pain and functional disability in the right knee due to arthritis and has failed non-surgical conservative treatments for greater than 12 weeks to includeNSAID's and/or analgesics and corticosteriod injections.  Onset of symptoms was gradual, starting >10 years ago with gradually worsening course since that time. The patient noted prior procedures on the knee to include  arthroscopy, menisectomy and ACL reconstruction on the right knee(s).  Patient currently rates pain in the right knee(s) at 5 out of 10 with activity. Patient has night pain and worsening of pain with activity and weight bearing.  Patient has evidence of subchondral sclerosis and joint space narrowing by imaging studies. There is no active infection.  There are no active problems to display for this patient.  Past Medical History  Diagnosis Date  . Hypertension   . Arthritis   . GERD (gastroesophageal reflux disease)   . Osteoporosis   . Wears partial dentures   . Wears hearing aid     both ears  . Hypothyroidism     Past Surgical History  Procedure Laterality Date  . Knee arthroscopy w/ acl reconstruction  2001    right  . Knee arthroscopy  1990    rt  . Knee arthroscopy  2002    rt  . Bone resection  2004    left humerous  . Ankle arthroscopy  2008    spurs-left  . Colonoscopy    . Knee arthroscopy with medial menisectomy Left 12/13/2013    Procedure: LEFT KNEE ARTHROSCOPY WITH MEDIAL MENISECTOMY, PLICA EXCISION, CHONDROPLASTY;  Surgeon: Ninetta Lights, MD;  Location: [facility];  Service: Orthopedics;  Laterality: Left;     (Not in a hospital admission) No Known Allergies  History  Substance Use Topics  . Smoking status: Never Smoker   . Smokeless tobacco: Not on file  . Alcohol Use: Yes     Comment:  occ    No family history on file.   Review of Systems  Constitutional: Negative.   HENT: Negative.   Eyes: Negative.   Respiratory: Negative.   Cardiovascular: Negative.   Gastrointestinal: Negative.   Genitourinary: Negative.   Musculoskeletal: Positive for joint pain.  Skin: Negative.   Neurological: Negative.   Endo/Heme/Allergies: Negative.   Psychiatric/Behavioral: Negative.     Objective:  Physical Exam  Constitutional: She appears well-developed and well-nourished.  HENT:  Head: Normocephalic and atraumatic.  Eyes: EOM are normal. Pupils are equal, round, and reactive to light.  Neck: Normal range of motion. Neck supple.  Cardiovascular: Normal rate, regular rhythm and normal heart sounds.  Exam reveals no friction rub.   No murmur heard. Respiratory: Effort normal and breath sounds normal. No respiratory distress. She has no wheezes. She has no rales.  GI: Soft. Bowel sounds are normal. She exhibits no distension.  Musculoskeletal:  Exam of her right knee reveals antalgic gait with a Trendelenburg component. Varus thrust. She has range of motion 0-100 degrees. Tenderness to palpation medial and lateral joint line. Moderate patellofemoral crepitus. Negative straight leg raise. She is neurovascularly intact distally.  Neurological: She is alert. She has normal reflexes.  Skin: Skin is warm and dry.  Psychiatric: She has a normal mood and affect. Her behavior is normal. Judgment and thought content normal.    Vital signs in last 24 hours: @  VSRANGES@  Labs:   Estimated body mass index is 30.67 kg/(m^2) as calculated from the following:   Height as of 12/13/13: 5\' 1"  (1.549 m).   Weight as of 12/13/13: 73.596 kg (162 lb 4 oz).   Imaging Review Plain radiographs demonstrate severe degenerative joint disease of the right knee(s). The overall alignment ismild varus. The bone quality appears to be fair for age and reported activity level.  Assessment/Plan:  End  stage arthritis, right knee   The patient history, physical examination, clinical judgment of the provider and imaging studies are consistent with end stage degenerative joint disease of the right knee(s) and total knee arthroplasty is deemed medically necessary. The treatment options including medical management, injection therapy arthroscopy and arthroplasty were discussed at length. The risks and benefits of total knee arthroplasty were presented and reviewed. The risks due to aseptic loosening, infection, stiffness, patella tracking problems, thromboembolic complications and other imponderables were discussed. The patient acknowledged the explanation, agreed to proceed with the plan and consent was signed. Patient is being admitted for inpatient treatment for surgery, pain control, PT, OT, prophylactic antibiotics, VTE prophylaxis, progressive ambulation and ADL's and discharge planning. The patient is planning to be discharged to SNF

## 2014-08-15 DIAGNOSIS — K219 Gastro-esophageal reflux disease without esophagitis: Secondary | ICD-10-CM | POA: Insufficient documentation

## 2014-08-15 DIAGNOSIS — R7303 Prediabetes: Secondary | ICD-10-CM | POA: Insufficient documentation

## 2014-08-15 DIAGNOSIS — J309 Allergic rhinitis, unspecified: Secondary | ICD-10-CM | POA: Insufficient documentation

## 2014-08-15 DIAGNOSIS — I10 Essential (primary) hypertension: Secondary | ICD-10-CM

## 2014-08-15 DIAGNOSIS — E039 Hypothyroidism, unspecified: Secondary | ICD-10-CM | POA: Insufficient documentation

## 2014-08-15 DIAGNOSIS — J302 Other seasonal allergic rhinitis: Secondary | ICD-10-CM | POA: Insufficient documentation

## 2014-08-15 DIAGNOSIS — E785 Hyperlipidemia, unspecified: Secondary | ICD-10-CM | POA: Insufficient documentation

## 2014-08-15 NOTE — Pre-Procedure Instructions (Signed)
TANARA TURVEY  08/15/2014   Your procedure is scheduled on:  Wednesday,  February 3rd  Report to [facility] Admitting at 630 AM.  Call this number if you have problems the morning of surgery: [phone]   Remember:   Do not eat food or drink liquids after midnight.   Take these medicines the morning of surgery with A SIP OF WATER: norvasc, synthroid, percocet if needed  Stop taking aspirin, OTC vitamins/herbal medications, NSAIDS (ibuprofen, advil, motrin) 7 days prior to surgery.    Do not wear jewelry, make-up or nail polish.  Do not wear lotions, powders, or perfume, deodorant.  Do not shave 48 hours prior to surgery. Men may shave face and neck.  Do not bring valuables to the hospital.  [facility] is not responsible   for any belongings or valuables.               Contacts, dentures or bridgework may not be worn into surgery.  Leave suitcase in the car. After surgery it may be brought to your room.  For patients admitted to the hospital, discharge time is determined by your  treatment team.           Please read over the following fact sheets that you were given: Pain Booklet, Coughing and Deep Breathing, MRSA Information and Surgical Site Infection Prevention  [place] - Preparing for Surgery  Before surgery, you can play an important role.  Because skin is not sterile, your skin needs to be as free of germs as possible.  You can reduce the number of germs on you skin by washing with CHG (chlorahexidine gluconate) soap before surgery.  CHG is an antiseptic cleaner which kills germs and bonds with the skin to continue killing germs even after washing.  Please DO NOT use if you have an allergy to CHG or antibacterial soaps.  If your skin becomes reddened/irritated stop using the CHG and inform your nurse when you arrive at Short Stay.  Do not shave (including legs and underarms) for at least 48 hours prior to the first CHG shower.  You may shave your  face.  Please follow these instructions carefully:   1.  Shower with CHG Soap the night before surgery and the morning of Surgery.  2.  If you choose to wash your hair, wash your hair first as usual with your normal shampoo.  3.  After you shampoo, rinse your hair and body thoroughly to remove the shampoo.  4.  Use CHG as you would any other liquid soap.  You can apply CHG directly to the skin and wash gently with scrungie or a clean washcloth.  5.  Apply the CHG Soap to your body ONLY FROM THE NECK DOWN.  Do not use on open wounds or open sores.  Avoid contact with your eyes, ears, mouth and genitals (private parts).  Wash genitals (private parts) with your normal soap.  6.  Wash thoroughly, paying special attention to the area where your surgery will be performed.  7.  Thoroughly rinse your body with warm water from the neck down.  8.  DO NOT shower/wash with your normal soap after using and rinsing off the CHG Soap.  9.  Pat yourself dry with a clean towel.            10.  Wear clean pajamas.            11.  Place clean sheets on your bed the  night of your first shower and do not sleep with pets.  Day of Surgery  Do not apply any lotions/deoderants the morning of surgery.  Please wear clean clothes to the hospital/surgery center.

## 2014-08-15 NOTE — Progress Notes (Signed)
Anesthesia Chart Review:  Patient is a 64 year old female scheduled for right TKA on 08/28/14 by Dr. Kathryne Hitch.  History includes non-smoker, HTN, GERD, hypothyroidism, laryngotracheal refluxt, pre-diabetes, hearing aid, arthritis, osteoporosis, partial dentures, left knee arthroscopy for resection of torn medial and superolateral plica 3/36/12. PCP is listed as Dr. Shirline Frees.  Meds include: amlodipine, benazepril-HCTZ, Cinnamon, levothyroxine, Nasonex, Fish Oil, Prilosec, Zofran, oxycodone, Evista, Red Yeast Rice.  EKG 12/10/13: NSR.  Patient's PAT visit is pending.  She contacted me regarding taking Isagenix protein shakes (discussed with Dr. Orene Desanctis and okay for her to take up until she in NPO).  She is holding the cleansing, energizing, and excellerator supplements.  She also takes green tea and garcinia cambogia capsules which she was instructed to hold for one week prior to surgery.  Further review following her PAT visit.  George Hugh [facility] Short Stay Center/Anesthesiology Phone [phone] 08/15/2014 4:16 PM

## 2014-08-16 ENCOUNTER — Inpatient Hospital Stay (HOSPITAL_COMMUNITY)
Admission: RE | Admit: 2014-08-16 | Discharge: 2014-08-16 | Disposition: A | Discharge status: Home / Self Care | Payer: BC Managed Care – PPO | Source: Ambulatory Visit

## 2014-08-19 ENCOUNTER — Encounter (HOSPITAL_COMMUNITY): Payer: Self-pay | Admitting: Pharmacy Technician

## 2014-08-20 ENCOUNTER — Encounter (HOSPITAL_COMMUNITY): Payer: Self-pay

## 2014-08-20 ENCOUNTER — Encounter (HOSPITAL_COMMUNITY)
Admission: RE | Admit: 2014-08-20 | Discharge: 2014-08-20 | Disposition: A | Discharge status: Home / Self Care | Payer: Medicare Other | Source: Ambulatory Visit | Attending: Orthopedic Surgery | Admitting: Orthopedic Surgery

## 2014-08-20 DIAGNOSIS — Z01812 Encounter for preprocedural laboratory examination: Secondary | ICD-10-CM | POA: Diagnosis present

## 2014-08-20 DIAGNOSIS — M1711 Unilateral primary osteoarthritis, right knee: Secondary | ICD-10-CM | POA: Insufficient documentation

## 2014-08-20 HISTORY — DX: Family history of other specified conditions: Z84.89

## 2014-08-20 HISTORY — DX: Malignant (primary) neoplasm, unspecified: C80.1

## 2014-08-20 HISTORY — DX: Sleep apnea, unspecified: G47.30

## 2014-08-20 HISTORY — DX: Nasal congestion: R09.81

## 2014-08-20 HISTORY — DX: Anemia, unspecified: D64.9

## 2014-08-20 LAB — URINALYSIS, ROUTINE W REFLEX MICROSCOPIC
Bilirubin Urine: NEGATIVE
Glucose, UA: NEGATIVE mg/dL
Hgb urine dipstick: NEGATIVE
Ketones, ur: NEGATIVE mg/dL
Nitrite: NEGATIVE
Protein, ur: NEGATIVE mg/dL
Specific Gravity, Urine: 1.008 (ref 1.005–1.030)
Urobilinogen, UA: 0.2 mg/dL (ref 0.0–1.0)
pH: 5.5 (ref 5.0–8.0)

## 2014-08-20 LAB — CBC WITH DIFFERENTIAL/PLATELET
Basophils Absolute: 0 10*3/uL (ref 0.0–0.1)
Basophils Relative: 0 % (ref 0–1)
Eosinophils Absolute: 0.2 10*3/uL (ref 0.0–0.7)
Eosinophils Relative: 2 % (ref 0–5)
HCT: 39.5 % (ref 36.0–46.0)
Hemoglobin: 13.3 g/dL (ref 12.0–15.0)
Lymphocytes Relative: 24 % (ref 12–46)
Lymphs Abs: 2 10*3/uL (ref 0.7–4.0)
MCH: 29.9 pg (ref 26.0–34.0)
MCHC: 33.7 g/dL (ref 30.0–36.0)
MCV: 88.8 fL (ref 78.0–100.0)
Monocytes Absolute: 0.8 10*3/uL (ref 0.1–1.0)
Monocytes Relative: 10 % (ref 3–12)
Neutro Abs: 5.1 10*3/uL (ref 1.7–7.7)
Neutrophils Relative %: 64 % (ref 43–77)
Platelets: 290 10*3/uL (ref 150–400)
RBC: 4.45 MIL/uL (ref 3.87–5.11)
RDW: 13.9 % (ref 11.5–15.5)
WBC: 8 10*3/uL (ref 4.0–10.5)

## 2014-08-20 LAB — COMPREHENSIVE METABOLIC PANEL
ALT: 16 U/L (ref 0–35)
AST: 21 U/L (ref 0–37)
Albumin: 3.7 g/dL (ref 3.5–5.2)
Alkaline Phosphatase: 69 U/L (ref 39–117)
Anion gap: 10 (ref 5–15)
BUN: 17 mg/dL (ref 6–23)
CO2: 24 mmol/L (ref 19–32)
Calcium: 9.7 mg/dL (ref 8.4–10.5)
Chloride: 105 mmol/L (ref 96–112)
Creatinine, Ser: 0.77 mg/dL (ref 0.50–1.10)
GFR calc Af Amer: >90 mL/min (ref >90)
GFR calc non Af Amer: 86 mL/min — ABNORMAL LOW (ref >90)
Glucose, Bld: 99 mg/dL (ref 70–99)
Potassium: 3.6 mmol/L (ref 3.5–5.1)
Sodium: 139 mmol/L (ref 135–145)
Total Bilirubin: 0.4 mg/dL (ref 0.3–1.2)
Total Protein: 7.3 g/dL (ref 6.0–8.3)

## 2014-08-20 LAB — URINE MICROSCOPIC-ADD ON

## 2014-08-20 LAB — PROTIME-INR
INR: 1.06 (ref 0.00–1.49)
Prothrombin Time: 13.9 seconds (ref 11.6–15.2)

## 2014-08-20 LAB — SURGICAL PCR SCREEN
MRSA, PCR: NEGATIVE
Staphylococcus aureus: NEGATIVE

## 2014-08-20 LAB — ABO/RH: ABO/RH(D): O POS

## 2014-08-20 LAB — TYPE AND SCREEN
ABO/RH(D): O POS
Antibody Screen: NEGATIVE

## 2014-08-20 LAB — APTT: aPTT: 30 seconds (ref 24–37)

## 2014-08-20 MED ORDER — CHLORHEXIDINE GLUCONATE 4 % EX LIQD: 60.0000 mL | Freq: Once | CUTANEOUS | Status: DC

## 2014-08-21 LAB — URINE CULTURE: Colony Count: >=100000

## 2014-08-27 MED ORDER — CEFAZOLIN SODIUM-DEXTROSE 2-3 GM-% IV SOLR
2.0000 g | INTRAVENOUS | Status: AC
  Administered 2014-08-28: 2 g via INTRAVENOUS
  Filled 2014-08-27: qty 50

## 2014-08-27 MED ORDER — LACTATED RINGERS IV SOLN: INTRAVENOUS | Status: DC

## 2014-08-27 NOTE — Anesthesia Preprocedure Evaluation (Addendum)
Anesthesia Evaluation  Patient identified by MRN, date of birth, ID band Patient awake    Reviewed: Allergy & Precautions, NPO status , Patient's Chart, lab work & pertinent test results, reviewed documented beta blocker date and time   Airway Mallampati: II  TM Distance: >3 FB Neck ROM: Full    Dental  (+) Teeth Intact, Partial Upper, Partial Lower   Pulmonary sleep apnea ,          Cardiovascular hypertension, Pt. on medications  EKG WNL 11/2013   Neuro/Psych    GI/Hepatic GERD-  Medicated,  Endo/Other  Hypothyroidism (replacdment)   Renal/GU      Musculoskeletal  (+) Arthritis -, Osteoarthritis,    Abdominal (+)  Abdomen: soft.    Peds  Hematology  (+) anemia , 13/39   Anesthesia Other Findings   Reproductive/Obstetrics                           Anesthesia Physical Anesthesia Plan  ASA: II  Anesthesia Plan: General   Post-op Pain Management:    Induction: Intravenous  Airway Management Planned: Oral ETT  Additional Equipment:   Intra-op Plan:   Post-operative Plan: Extubation in OR  Informed Consent: I have reviewed the patients History and Physical, chart, labs and discussed the procedure including the risks, benefits and alternatives for the proposed anesthesia with the patient or authorized representative who has indicated his/her understanding and acceptance.   Dental advisory given  Plan Discussed with: CRNA and Anesthesiologist  Anesthesia Plan Comments: (Multimodal pain RX)       Anesthesia Quick Evaluation

## 2014-08-28 ENCOUNTER — Encounter (HOSPITAL_COMMUNITY)
Admission: RE | Disposition: A | Discharge status: Skilled Nursing Facility | Payer: Self-pay | Source: Ambulatory Visit | Attending: Orthopedic Surgery

## 2014-08-28 ENCOUNTER — Inpatient Hospital Stay (HOSPITAL_COMMUNITY): Payer: Medicare Other | Admitting: Vascular Surgery

## 2014-08-28 ENCOUNTER — Encounter (HOSPITAL_COMMUNITY): Payer: Self-pay | Admitting: *Deleted

## 2014-08-28 ENCOUNTER — Inpatient Hospital Stay (HOSPITAL_COMMUNITY): Payer: Medicare Other

## 2014-08-28 ENCOUNTER — Inpatient Hospital Stay (HOSPITAL_COMMUNITY)
Admission: RE | Admit: 2014-08-28 | Discharge: 2014-08-31 | DRG: 470 | Disposition: A | Discharge status: Skilled Nursing Facility | Payer: Medicare Other | Source: Ambulatory Visit | Attending: Orthopedic Surgery | Admitting: Orthopedic Surgery

## 2014-08-28 DIAGNOSIS — Z7901 Long term (current) use of anticoagulants: Secondary | ICD-10-CM

## 2014-08-28 DIAGNOSIS — M25561 Pain in right knee: Secondary | ICD-10-CM | POA: Diagnosis present

## 2014-08-28 DIAGNOSIS — M8580 Other specified disorders of bone density and structure, unspecified site: Secondary | ICD-10-CM | POA: Diagnosis present

## 2014-08-28 DIAGNOSIS — G4730 Sleep apnea, unspecified: Secondary | ICD-10-CM | POA: Diagnosis present

## 2014-08-28 DIAGNOSIS — I10 Essential (primary) hypertension: Secondary | ICD-10-CM | POA: Diagnosis present

## 2014-08-28 DIAGNOSIS — K219 Gastro-esophageal reflux disease without esophagitis: Secondary | ICD-10-CM | POA: Diagnosis present

## 2014-08-28 DIAGNOSIS — M1711 Unilateral primary osteoarthritis, right knee: Principal | ICD-10-CM | POA: Diagnosis present

## 2014-08-28 DIAGNOSIS — D62 Acute posthemorrhagic anemia: Secondary | ICD-10-CM | POA: Diagnosis not present

## 2014-08-28 DIAGNOSIS — M179 Osteoarthritis of knee, unspecified: Secondary | ICD-10-CM | POA: Diagnosis present

## 2014-08-28 DIAGNOSIS — E039 Hypothyroidism, unspecified: Secondary | ICD-10-CM | POA: Diagnosis present

## 2014-08-28 DIAGNOSIS — M1710 Unilateral primary osteoarthritis, unspecified knee: Secondary | ICD-10-CM | POA: Diagnosis present

## 2014-08-28 DIAGNOSIS — Z974 Presence of external hearing-aid: Secondary | ICD-10-CM | POA: Diagnosis not present

## 2014-08-28 DIAGNOSIS — Z79899 Other long term (current) drug therapy: Secondary | ICD-10-CM | POA: Diagnosis not present

## 2014-08-28 DIAGNOSIS — M810 Age-related osteoporosis without current pathological fracture: Secondary | ICD-10-CM | POA: Diagnosis present

## 2014-08-28 DIAGNOSIS — Z96659 Presence of unspecified artificial knee joint: Secondary | ICD-10-CM

## 2014-08-28 HISTORY — PX: TOTAL KNEE ARTHROPLASTY: SHX125

## 2014-08-28 LAB — CREATININE, SERUM
Creatinine, Ser: 0.75 mg/dL (ref 0.50–1.10)
GFR calc Af Amer: >90 mL/min (ref >90)
GFR calc non Af Amer: 87 mL/min — ABNORMAL LOW (ref >90)

## 2014-08-28 LAB — CBC
HCT: 37.9 % (ref 36.0–46.0)
Hemoglobin: 12.4 g/dL (ref 12.0–15.0)
MCH: 29 pg (ref 26.0–34.0)
MCHC: 32.7 g/dL (ref 30.0–36.0)
MCV: 88.6 fL (ref 78.0–100.0)
Platelets: 295 10*3/uL (ref 150–400)
RBC: 4.28 MIL/uL (ref 3.87–5.11)
RDW: 13.8 % (ref 11.5–15.5)
WBC: 14.5 10*3/uL — ABNORMAL HIGH (ref 4.0–10.5)

## 2014-08-28 SURGERY — ARTHROPLASTY, KNEE, TOTAL
Anesthesia: General | Site: Knee | Laterality: Right

## 2014-08-28 MED ORDER — PHENYLEPHRINE 40 MCG/ML (10ML) SYRINGE FOR IV PUSH (FOR BLOOD PRESSURE SUPPORT)
PREFILLED_SYRINGE | INTRAVENOUS | Status: AC
  Filled 2014-08-28: qty 10

## 2014-08-28 MED ORDER — FENTANYL CITRATE 0.05 MG/ML IJ SOLN
INTRAMUSCULAR | Status: AC
  Filled 2014-08-28: qty 5

## 2014-08-28 MED ORDER — ONDANSETRON HCL 4 MG/2ML IJ SOLN
INTRAMUSCULAR | Status: DC | PRN
  Administered 2014-08-28: 4 mg via INTRAVENOUS

## 2014-08-28 MED ORDER — BUPIVACAINE HCL (PF) 0.25 % IJ SOLN
INTRAMUSCULAR | Status: AC
  Filled 2014-08-28: qty 30

## 2014-08-28 MED ORDER — FENTANYL CITRATE 0.05 MG/ML IJ SOLN
INTRAMUSCULAR | Status: AC
  Administered 2014-08-28: 50 ug via INTRAVENOUS
  Filled 2014-08-28: qty 2

## 2014-08-28 MED ORDER — BENAZEPRIL-HYDROCHLOROTHIAZIDE 20-12.5 MG PO TABS: 1.0000 | ORAL_TABLET | Freq: Every day | ORAL | Status: DC

## 2014-08-28 MED ORDER — GLYCOPYRROLATE 0.2 MG/ML IJ SOLN
INTRAMUSCULAR | Status: DC | PRN
  Administered 2014-08-28: 0.4 mg via INTRAVENOUS

## 2014-08-28 MED ORDER — ACETAMINOPHEN 325 MG PO TABS
650.0000 mg | ORAL_TABLET | Freq: Four times a day (QID) | ORAL | Status: DC | PRN
  Administered 2014-08-30: 650 mg via ORAL
  Filled 2014-08-28: qty 2

## 2014-08-28 MED ORDER — MIDAZOLAM HCL 2 MG/2ML IJ SOLN
INTRAMUSCULAR | Status: AC
  Filled 2014-08-28: qty 2

## 2014-08-28 MED ORDER — MIDAZOLAM HCL 5 MG/5ML IJ SOLN
INTRAMUSCULAR | Status: DC | PRN
  Administered 2014-08-28: 2 mg via INTRAVENOUS

## 2014-08-28 MED ORDER — EPHEDRINE SULFATE 50 MG/ML IJ SOLN
INTRAMUSCULAR | Status: AC
  Filled 2014-08-28: qty 1

## 2014-08-28 MED ORDER — PHENOL 1.4 % MT LIQD: 1.0000 | OROMUCOSAL | Status: DC | PRN

## 2014-08-28 MED ORDER — DEXAMETHASONE SODIUM PHOSPHATE 10 MG/ML IJ SOLN
10.0000 mg | Freq: Once | INTRAMUSCULAR | Status: DC
  Filled 2014-08-28: qty 1

## 2014-08-28 MED ORDER — AMLODIPINE BESYLATE 5 MG PO TABS
5.0000 mg | ORAL_TABLET | Freq: Every day | ORAL | Status: DC
  Administered 2014-08-28 – 2014-08-31 (×3): 5 mg via ORAL
  Filled 2014-08-28 (×4): qty 1

## 2014-08-28 MED ORDER — METOCLOPRAMIDE HCL 10 MG PO TABS: 5.0000 mg | ORAL_TABLET | Freq: Three times a day (TID) | ORAL | Status: DC | PRN

## 2014-08-28 MED ORDER — BUPIVACAINE LIPOSOME 1.3 % IJ SUSP
20.0000 mL | INTRAMUSCULAR | Status: AC
  Administered 2014-08-28: 20 mL
  Filled 2014-08-28: qty 20

## 2014-08-28 MED ORDER — BISACODYL 5 MG PO TBEC: 5.0000 mg | DELAYED_RELEASE_TABLET | Freq: Every day | ORAL | Status: DC | PRN

## 2014-08-28 MED ORDER — CEFAZOLIN SODIUM 1-5 GM-% IV SOLN
1.0000 g | Freq: Four times a day (QID) | INTRAVENOUS | Status: AC
  Administered 2014-08-28 (×2): 1 g via INTRAVENOUS
  Filled 2014-08-28 (×2): qty 50

## 2014-08-28 MED ORDER — SALINE SPRAY 0.65 % NA SOLN
1.0000 | NASAL | Status: DC | PRN
  Filled 2014-08-28: qty 44

## 2014-08-28 MED ORDER — ONDANSETRON HCL 4 MG/2ML IJ SOLN
INTRAMUSCULAR | Status: AC
  Filled 2014-08-28: qty 2

## 2014-08-28 MED ORDER — OXYCODONE-ACETAMINOPHEN 5-325 MG PO TABS: 1.0000 | ORAL_TABLET | ORAL | Status: DC | PRN

## 2014-08-28 MED ORDER — CELECOXIB 200 MG PO CAPS
200.0000 mg | ORAL_CAPSULE | Freq: Two times a day (BID) | ORAL | Status: DC
  Administered 2014-08-28 – 2014-08-31 (×7): 200 mg via ORAL
  Filled 2014-08-28 (×7): qty 1

## 2014-08-28 MED ORDER — METOCLOPRAMIDE HCL 5 MG/ML IJ SOLN: 5.0000 mg | Freq: Three times a day (TID) | INTRAMUSCULAR | Status: DC | PRN

## 2014-08-28 MED ORDER — SUCCINYLCHOLINE CHLORIDE 20 MG/ML IJ SOLN
INTRAMUSCULAR | Status: AC
  Filled 2014-08-28: qty 1

## 2014-08-28 MED ORDER — MEPERIDINE HCL 25 MG/ML IJ SOLN: 6.2500 mg | INTRAMUSCULAR | Status: DC | PRN

## 2014-08-28 MED ORDER — ACETAMINOPHEN 650 MG RE SUPP: 650.0000 mg | Freq: Four times a day (QID) | RECTAL | Status: DC | PRN

## 2014-08-28 MED ORDER — ROCURONIUM BROMIDE 100 MG/10ML IV SOLN
INTRAVENOUS | Status: DC | PRN
  Administered 2014-08-28: 40 mg via INTRAVENOUS

## 2014-08-28 MED ORDER — ENOXAPARIN SODIUM 30 MG/0.3ML ~~LOC~~ SOLN
30.0000 mg | Freq: Two times a day (BID) | SUBCUTANEOUS | Status: DC
  Administered 2014-08-29 – 2014-08-31 (×5): 30 mg via SUBCUTANEOUS
  Filled 2014-08-28 (×7): qty 0.3

## 2014-08-28 MED ORDER — METHOCARBAMOL 1000 MG/10ML IJ SOLN
500.0000 mg | Freq: Four times a day (QID) | INTRAVENOUS | Status: DC | PRN
  Filled 2014-08-28: qty 5

## 2014-08-28 MED ORDER — HYDROMORPHONE HCL 1 MG/ML IJ SOLN
0.5000 mg | INTRAMUSCULAR | Status: DC | PRN
  Administered 2014-08-29: 1 mg via INTRAVENOUS
  Filled 2014-08-28: qty 1

## 2014-08-28 MED ORDER — LACTATED RINGERS IV SOLN
INTRAVENOUS | Status: DC | PRN
  Administered 2014-08-28 (×2): via INTRAVENOUS

## 2014-08-28 MED ORDER — METHOCARBAMOL 500 MG PO TABS: 500.0000 mg | ORAL_TABLET | Freq: Four times a day (QID) | ORAL | Status: DC

## 2014-08-28 MED ORDER — MENTHOL 3 MG MT LOZG: 1.0000 | LOZENGE | OROMUCOSAL | Status: DC | PRN

## 2014-08-28 MED ORDER — HYDROCHLOROTHIAZIDE 12.5 MG PO CAPS
12.5000 mg | ORAL_CAPSULE | Freq: Every day | ORAL | Status: DC
  Administered 2014-08-28 – 2014-08-31 (×3): 12.5 mg via ORAL
  Filled 2014-08-28 (×5): qty 1

## 2014-08-28 MED ORDER — CO Q 10 10 MG PO CAPS: 10.0000 mg | ORAL_CAPSULE | Freq: Two times a day (BID) | ORAL | Status: DC

## 2014-08-28 MED ORDER — POTASSIUM CHLORIDE IN NACL 20-0.9 MEQ/L-% IV SOLN
INTRAVENOUS | Status: DC
  Administered 2014-08-28 – 2014-08-29 (×2): via INTRAVENOUS
  Filled 2014-08-28 (×4): qty 1000

## 2014-08-28 MED ORDER — LEVOTHYROXINE SODIUM 100 MCG PO TABS
100.0000 ug | ORAL_TABLET | Freq: Every day | ORAL | Status: DC
  Administered 2014-08-29 – 2014-08-31 (×3): 100 ug via ORAL
  Filled 2014-08-28 (×6): qty 1

## 2014-08-28 MED ORDER — ONDANSETRON HCL 4 MG PO TABS: 4.0000 mg | ORAL_TABLET | Freq: Three times a day (TID) | ORAL | Status: DC | PRN

## 2014-08-28 MED ORDER — FENTANYL CITRATE 0.05 MG/ML IJ SOLN
25.0000 ug | INTRAMUSCULAR | Status: DC | PRN
  Administered 2014-08-28 (×3): 50 ug via INTRAVENOUS

## 2014-08-28 MED ORDER — PROMETHAZINE HCL 25 MG/ML IJ SOLN: 6.2500 mg | INTRAMUSCULAR | Status: DC | PRN

## 2014-08-28 MED ORDER — VITAMIN D3 25 MCG (1000 UNIT) PO TABS
5000.0000 [IU] | ORAL_TABLET | Freq: Every day | ORAL | Status: DC
  Administered 2014-08-28 – 2014-08-31 (×4): 5000 [IU] via ORAL
  Filled 2014-08-28 (×4): qty 5

## 2014-08-28 MED ORDER — ENOXAPARIN SODIUM 30 MG/0.3ML ~~LOC~~ SOLN: 30.0000 mg | Freq: Two times a day (BID) | SUBCUTANEOUS | Status: DC

## 2014-08-28 MED ORDER — BUPIVACAINE HCL (PF) 0.25 % IJ SOLN
INTRAMUSCULAR | Status: DC | PRN
  Administered 2014-08-28: 10 mL

## 2014-08-28 MED ORDER — PANTOPRAZOLE SODIUM 40 MG PO TBEC
80.0000 mg | DELAYED_RELEASE_TABLET | Freq: Every day | ORAL | Status: DC
  Administered 2014-08-28 – 2014-08-31 (×4): 80 mg via ORAL
  Filled 2014-08-28 (×4): qty 2

## 2014-08-28 MED ORDER — LORATADINE 10 MG PO TABS
10.0000 mg | ORAL_TABLET | Freq: Every day | ORAL | Status: DC
  Administered 2014-08-28 – 2014-08-31 (×4): 10 mg via ORAL
  Filled 2014-08-28 (×4): qty 1

## 2014-08-28 MED ORDER — METHOCARBAMOL 1000 MG/10ML IJ SOLN
500.0000 mg | INTRAVENOUS | Status: AC
  Administered 2014-08-28: 500 mg via INTRAVENOUS
  Filled 2014-08-28: qty 5

## 2014-08-28 MED ORDER — METHOCARBAMOL 500 MG PO TABS
500.0000 mg | ORAL_TABLET | Freq: Four times a day (QID) | ORAL | Status: DC | PRN
  Administered 2014-08-29 (×2): 500 mg via ORAL
  Filled 2014-08-28 (×2): qty 1

## 2014-08-28 MED ORDER — ONDANSETRON HCL 4 MG PO TABS: 4.0000 mg | ORAL_TABLET | Freq: Four times a day (QID) | ORAL | Status: DC | PRN

## 2014-08-28 MED ORDER — PROPOFOL 10 MG/ML IV BOLUS
INTRAVENOUS | Status: AC
  Filled 2014-08-28: qty 20

## 2014-08-28 MED ORDER — SODIUM CHLORIDE 0.9 % IJ SOLN
INTRAMUSCULAR | Status: AC
  Filled 2014-08-28: qty 10

## 2014-08-28 MED ORDER — TRIAMCINOLONE ACETONIDE 55 MCG/ACT NA AERO
2.0000 | INHALATION_SPRAY | Freq: Every day | NASAL | Status: DC
  Administered 2014-08-28 – 2014-08-30 (×3): 2 via NASAL
  Filled 2014-08-28: qty 21.6

## 2014-08-28 MED ORDER — BENAZEPRIL HCL 20 MG PO TABS
20.0000 mg | ORAL_TABLET | Freq: Every day | ORAL | Status: DC
  Administered 2014-08-31: 20 mg via ORAL
  Filled 2014-08-28 (×4): qty 1

## 2014-08-28 MED ORDER — DOCUSATE SODIUM 100 MG PO CAPS
100.0000 mg | ORAL_CAPSULE | Freq: Two times a day (BID) | ORAL | Status: DC
Start: 2014-08-28 — End: 2014-08-31
  Administered 2014-08-28 – 2014-08-31 (×7): 100 mg via ORAL
  Filled 2014-08-28 (×8): qty 1

## 2014-08-28 MED ORDER — DIPHENHYDRAMINE HCL 12.5 MG/5ML PO ELIX: 12.5000 mg | ORAL_SOLUTION | ORAL | Status: DC | PRN

## 2014-08-28 MED ORDER — PROPOFOL 10 MG/ML IV BOLUS
INTRAVENOUS | Status: DC | PRN
  Administered 2014-08-28: 150 mg via INTRAVENOUS

## 2014-08-28 MED ORDER — ONDANSETRON HCL 4 MG/2ML IJ SOLN: 4.0000 mg | Freq: Four times a day (QID) | INTRAMUSCULAR | Status: DC | PRN

## 2014-08-28 MED ORDER — ZOLPIDEM TARTRATE 5 MG PO TABS
5.0000 mg | ORAL_TABLET | Freq: Every evening | ORAL | Status: DC | PRN
Start: 2014-08-28 — End: 2014-08-31

## 2014-08-28 MED ORDER — POLYVINYL ALCOHOL 1.4 % OP SOLN
1.0000 [drp] | Freq: Four times a day (QID) | OPHTHALMIC | Status: DC
  Administered 2014-08-28 – 2014-08-31 (×11): 1 [drp] via OPHTHALMIC
  Filled 2014-08-28: qty 15

## 2014-08-28 MED ORDER — FENTANYL CITRATE 0.05 MG/ML IJ SOLN
INTRAMUSCULAR | Status: AC
  Filled 2014-08-28: qty 2

## 2014-08-28 MED ORDER — LIDOCAINE HCL (CARDIAC) 20 MG/ML IV SOLN
INTRAVENOUS | Status: AC
  Filled 2014-08-28: qty 5

## 2014-08-28 MED ORDER — LIDOCAINE HCL (CARDIAC) 20 MG/ML IV SOLN
INTRAVENOUS | Status: DC | PRN
  Administered 2014-08-28: 100 mg via INTRAVENOUS

## 2014-08-28 MED ORDER — FENTANYL CITRATE 0.05 MG/ML IJ SOLN
INTRAMUSCULAR | Status: DC | PRN
  Administered 2014-08-28: 50 ug via INTRAVENOUS
  Administered 2014-08-28 (×2): 25 ug via INTRAVENOUS
  Administered 2014-08-28: 100 ug via INTRAVENOUS
  Administered 2014-08-28: 50 ug via INTRAVENOUS

## 2014-08-28 MED ORDER — NEOSTIGMINE METHYLSULFATE 10 MG/10ML IV SOLN
INTRAVENOUS | Status: DC | PRN
  Administered 2014-08-28: 3 mg via INTRAVENOUS

## 2014-08-28 MED ORDER — ROCURONIUM BROMIDE 50 MG/5ML IV SOLN
INTRAVENOUS | Status: AC
  Filled 2014-08-28: qty 1

## 2014-08-28 MED ORDER — OXYCODONE HCL 5 MG PO TABS
5.0000 mg | ORAL_TABLET | ORAL | Status: DC | PRN
  Administered 2014-08-28 (×4): 5 mg via ORAL
  Administered 2014-08-29 – 2014-08-30 (×6): 10 mg via ORAL
  Filled 2014-08-28: qty 1
  Filled 2014-08-28 (×2): qty 2
  Filled 2014-08-28: qty 1
  Filled 2014-08-28: qty 2
  Filled 2014-08-28: qty 1
  Filled 2014-08-28: qty 2
  Filled 2014-08-28: qty 1
  Filled 2014-08-28 (×2): qty 2

## 2014-08-28 MED ORDER — SODIUM CHLORIDE 0.9 % IR SOLN
Status: DC | PRN
  Administered 2014-08-28: 1

## 2014-08-28 SURGICAL SUPPLY — 70 items
APL SKNCLS STERI-STRIP NONHPOA (GAUZE/BANDAGES/DRESSINGS) ×1
BANDAGE ELASTIC 4 VELCRO ST LF (GAUZE/BANDAGES/DRESSINGS) ×2 IMPLANT
BANDAGE ELASTIC 6 VELCRO ST LF (GAUZE/BANDAGES/DRESSINGS) ×2 IMPLANT
BANDAGE ESMARK 6X9 LF (GAUZE/BANDAGES/DRESSINGS) ×1 IMPLANT
BENZOIN TINCTURE PRP APPL 2/3 (GAUZE/BANDAGES/DRESSINGS) ×2 IMPLANT
BLADE SAG 18X100X1.27 (BLADE) ×4 IMPLANT
BNDG ADH 5X4 AIR PERM ELC (GAUZE/BANDAGES/DRESSINGS) ×1
BNDG CMPR 9X6 STRL LF SNTH (GAUZE/BANDAGES/DRESSINGS) ×1
BNDG COHESIVE 4X5 WHT NS (GAUZE/BANDAGES/DRESSINGS) ×1 IMPLANT
BNDG ESMARK 6X9 LF (GAUZE/BANDAGES/DRESSINGS) ×2
BOWL SMART MIX CTS (DISPOSABLE) ×2 IMPLANT
CAPT KNEE TOTAL 3 ×1 IMPLANT
CEMENT BONE SIMPLEX SPEEDSET (Cement) ×4 IMPLANT
COVER SURGICAL LIGHT HANDLE (MISCELLANEOUS) ×2 IMPLANT
CUFF TOURNIQUET SINGLE 34IN LL (TOURNIQUET CUFF) ×2 IMPLANT
DRAPE EXTREMITY T 121X128X90 (DRAPE) ×2 IMPLANT
DRAPE IMP U-DRAPE 54X76 (DRAPES) ×2 IMPLANT
DRAPE PROXIMA HALF (DRAPES) ×2 IMPLANT
DRAPE U-SHAPE 47X51 STRL (DRAPES) ×2 IMPLANT
DRSG PAD ABDOMINAL 8X10 ST (GAUZE/BANDAGES/DRESSINGS) ×2 IMPLANT
DURAPREP 26ML APPLICATOR (WOUND CARE) ×4 IMPLANT
ELECT CAUTERY BLADE 6.4 (BLADE) ×2 IMPLANT
ELECT REM PT RETURN 9FT ADLT (ELECTROSURGICAL) ×2
ELECTRODE REM PT RTRN 9FT ADLT (ELECTROSURGICAL) ×1 IMPLANT
EVACUATOR 1/8 PVC DRAIN (DRAIN) ×2 IMPLANT
FACESHIELD WRAPAROUND (MASK) ×4 IMPLANT
FACESHIELD WRAPAROUND OR TEAM (MASK) ×2 IMPLANT
GAUZE SPONGE 4X4 12PLY STRL (GAUZE/BANDAGES/DRESSINGS) ×2 IMPLANT
GLOVE BIOGEL PI IND STRL 7.0 (GLOVE) ×2 IMPLANT
GLOVE BIOGEL PI INDICATOR 7.0 (GLOVE) ×2
GLOVE ECLIPSE 6.5 STRL STRAW (GLOVE) ×4 IMPLANT
GLOVE ORTHO TXT STRL SZ7.5 (GLOVE) ×2 IMPLANT
GOWN STRL REUS W/ TWL LRG LVL3 (GOWN DISPOSABLE) ×1 IMPLANT
GOWN STRL REUS W/ TWL XL LVL3 (GOWN DISPOSABLE) ×1 IMPLANT
GOWN STRL REUS W/TWL LRG LVL3 (GOWN DISPOSABLE) ×2
GOWN STRL REUS W/TWL XL LVL3 (GOWN DISPOSABLE) ×2
HANDPIECE INTERPULSE COAX TIP (DISPOSABLE) ×2
IMMOBILIZER KNEE 22 UNIV (SOFTGOODS) ×2 IMPLANT
IMMOBILIZER KNEE 24 THIGH 36 (MISCELLANEOUS) IMPLANT
IMMOBILIZER KNEE 24 UNIV (MISCELLANEOUS) ×2
KIT BASIN OR (CUSTOM PROCEDURE TRAY) ×2 IMPLANT
KIT ROOM TURNOVER OR (KITS) ×2 IMPLANT
MANIFOLD NEPTUNE II (INSTRUMENTS) ×2 IMPLANT
NDL 18GX1X1/2 (RX/OR ONLY) (NEEDLE) ×1 IMPLANT
NDL HYPO 25GX1X1/2 BEV (NEEDLE) ×1 IMPLANT
NEEDLE 18GX1X1/2 (RX/OR ONLY) (NEEDLE) ×2 IMPLANT
NEEDLE HYPO 25GX1X1/2 BEV (NEEDLE) ×2 IMPLANT
NS IRRIG 1000ML POUR BTL (IV SOLUTION) ×2 IMPLANT
PACK TOTAL JOINT (CUSTOM PROCEDURE TRAY) ×2 IMPLANT
PACK UNIVERSAL I (CUSTOM PROCEDURE TRAY) ×2 IMPLANT
PAD ARMBOARD 7.5X6 YLW CONV (MISCELLANEOUS) ×4 IMPLANT
PAD CAST 4YDX4 CTTN HI CHSV (CAST SUPPLIES) ×1 IMPLANT
PADDING CAST COTTON 4X4 STRL (CAST SUPPLIES) ×2
PADDING CAST COTTON 6X4 STRL (CAST SUPPLIES) ×2 IMPLANT
SET HNDPC FAN SPRY TIP SCT (DISPOSABLE) ×1 IMPLANT
SPONGE GAUZE 4X4 12PLY STER LF (GAUZE/BANDAGES/DRESSINGS) ×1 IMPLANT
STRIP CLOSURE SKIN 1/2X4 (GAUZE/BANDAGES/DRESSINGS) ×4 IMPLANT
SUCTION FRAZIER TIP 10 FR DISP (SUCTIONS) ×2 IMPLANT
SUT MNCRL AB 4-0 PS2 18 (SUTURE) ×2 IMPLANT
SUT VIC AB 0 CT1 27 (SUTURE)
SUT VIC AB 0 CT1 27XBRD ANBCTR (SUTURE) IMPLANT
SUT VIC AB 1 CT1 27 (SUTURE) ×4
SUT VIC AB 1 CT1 27XBRD ANBCTR (SUTURE) ×2 IMPLANT
SUT VIC AB 2-0 CT1 27 (SUTURE) ×4
SUT VIC AB 2-0 CT1 TAPERPNT 27 (SUTURE) ×2 IMPLANT
SYR 50ML LL SCALE MARK (SYRINGE) ×3 IMPLANT
SYR CONTROL 10ML LL (SYRINGE) ×2 IMPLANT
TOWEL OR 17X24 6PK STRL BLUE (TOWEL DISPOSABLE) ×2 IMPLANT
TOWEL OR 17X26 10 PK STRL BLUE (TOWEL DISPOSABLE) ×2 IMPLANT
WATER STERILE IRR 1000ML POUR (IV SOLUTION) ×4 IMPLANT

## 2014-08-28 NOTE — Evaluation (Signed)
Physical Therapy Evaluation Patient Details Name: Stacey Coleman MRN: [id number] DOB: 03-18-1951 Today's Date: 08/28/2014   History of Present Illness  Pt admitted 2/3 for elective R TKA.  Clinical Impression  Pt is s/p TKA resulting in the deficits listed below (see PT Problem List). Pt ambulation limited by onset of nausea but otherwise progressing well for POD #0. Pt will benefit from skilled PT to increase their independence and safety with mobility to allow discharge to the venue listed below.      Follow Up Recommendations SNF;Supervision/Assistance - 24 hour (pt plans on going to [facility])    Equipment Recommendations  Rolling walker with 5" wheels    Recommendations for Other Services       Precautions / Restrictions Precautions Precautions: Knee Precaution Comments: educated on zero knee Required Braces or Orthoses: Knee Immobilizer - Right Knee Immobilizer - Right: Discontinue once straight leg raise with < 10 degree lag Restrictions Weight Bearing Restrictions: Yes RLE Weight Bearing: Weight bearing as tolerated      Mobility  Bed Mobility Overal bed mobility: Needs Assistance Bed Mobility: Supine to Sit     Supine to sit: Min assist     General bed mobility comments: HOB elevated, assist for R LE  Transfers Overall transfer level: Needs assistance Equipment used: Rolling walker (2 wheeled) Transfers: Sit to/from [facility] Sit to Stand: Min assist Stand pivot transfers: Min assist       General transfer comment: v/c's for hand placement and sequencing  Ambulation/Gait Ambulation/Gait assistance: Min assist Ambulation Distance (Feet): 10 Feet Assistive device: Rolling walker (2 wheeled) Gait Pattern/deviations: Step-to pattern;Antalgic Gait velocity: slow   General Gait Details: tolerance limtied by onset of nausea. KI use but no noted L knee instability  Stairs            Wheelchair Mobility    Modified Rankin  (Stroke Patients Only)       Balance                                             Pertinent Vitals/Pain Pain Assessment: 0-10 Pain Score: 5  Pain Location: R knee Pain Intervention(s): Monitored during session    Home Living Family/patient expects to be discharged to:: Skilled nursing facility Living Arrangements: Alone                    Prior Function Level of Independence: Independent               Hand Dominance   Dominant Hand: Right    Extremity/Trunk Assessment   Upper Extremity Assessment: Overall WFL for tasks assessed           Lower Extremity Assessment: RLE deficits/detail RLE Deficits / Details: able to initiate quad set and active knee flexion    Cervical / Trunk Assessment: Normal  Communication   Communication: No difficulties  Cognition Arousal/Alertness: Awake/alert Behavior During Therapy: WFL for tasks assessed/performed Overall Cognitive Status: Within Functional Limits for tasks assessed                      General Comments General comments (skin integrity, edema, etc.): pt assist to [facility]. pt min guard for hygiene s/p tolieting    Exercises Total Joint Exercises Ankle Circles/Pumps: AROM;Both;10 reps;Supine Quad Sets: AROM;Right;10 reps;Supine Heel Slides: Right;AAROM;5 reps;Supine Goniometric ROM: 10-35  Assessment/Plan    PT Assessment Patient needs continued PT services  PT Diagnosis Difficulty walking;Acute pain   PT Problem List Decreased strength;Decreased activity tolerance;Decreased balance;Decreased range of motion  PT Treatment Interventions DME instruction;Gait training;Functional mobility training;Therapeutic activities;Therapeutic exercise   PT Goals (Current goals can be found in the Care Plan section) Acute Rehab PT Goals Patient Stated Goal: didn't state PT Goal Formulation: With patient Time For Goal Achievement: 09/04/14 Potential to Achieve Goals: Good     Frequency 7X/week   Barriers to discharge        Co-evaluation               End of Session Equipment Utilized During Treatment: Gait belt;Right knee immobilizer Activity Tolerance: Patient tolerated treatment well Patient left: in chair;with call bell/phone within reach;with family/visitor present Nurse Communication: Mobility status         Time: 1432-1510 PT Time Calculation (min) (ACUTE ONLY): 38 min   Charges:   PT Evaluation $Initial PT Evaluation Tier I: 1 Procedure PT Treatments $Gait Training: 8-22 mins $Therapeutic Activity: 8-22 mins   PT G CodesKingsley Callander 08/28/2014, 4:51 PM  Kittie Plater, PT, DPT Pager #: [phone] Office #: [phone]

## 2014-08-28 NOTE — Progress Notes (Signed)
Orthopedic Tech Progress Note Patient Details:  Stacey Coleman 11-23-1950 [id number]  CPM Right Knee CPM Right Knee: On Right Knee Flexion (Degrees): 90 Right Knee Extension (Degrees): 0 Additional Comments: trapeze baqr patient helper Viewed order from doctor's order list  [name] 08/28/2014, 11:32 AM

## 2014-08-28 NOTE — Discharge Summary (Addendum)
Patient ID: Stacey Coleman MRN: [id number] DOB/AGE: 1951/02/28 64 y.o.  Admit date: 08/28/2014 Discharge date: 08/31/2014  Admission Diagnoses:  Active Problems:   DJD (degenerative joint disease) of knee   Discharge Diagnoses:  Same  Past Medical History  Diagnosis Date  . Hypertension   . Arthritis   . GERD (gastroesophageal reflux disease)   . Osteoporosis   . Wears partial dentures   . Wears hearing aid     both ears  . Hypothyroidism   . Hypothyroid   . Seasonal allergies   . Laryngotracheal cleft     laryngotracheal reflux  . Prediabetes   . Family history of adverse reaction to anesthesia     mother nausea/vomiting  . Sleep apnea     11% do not use CPAP  . Nasal congestion     nasal dripping  . 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 Anemia 2013    Surgeries: Procedure(s): RIGHT TOTAL KNEE ARTHROPLASTY on 08/28/2014   Consultants:    Discharged Condition: Improved  Hospital Course: Stacey Coleman is an 64 y.o. female who was admitted 08/28/2014 for operative treatment of primary osteoarthritis right knee. Patient has severe unremitting pain that affects sleep, daily activities, and work/hobbies. After pre-op clearance the patient was taken to the operating room on 08/28/2014 and underwent  Procedure(s): RIGHT TOTAL KNEE ARTHROPLASTY.  Patient with a pre-op Hb of 13.3 developed ABLA on pod #1 with a Hb of 10.4 and 9.5 on pod#3 just prior to d/c. Patient is currently asymptomatic but we will continue to follow.  Patient was given perioperative antibiotics:      Anti-infectives    Start     Dose/Rate Route Frequency Ordered Stop   08/28/14 1400  ceFAZolin (ANCEF) IVPB 1 g/50 mL premix     1 g100 mL/hr over 30 Minutes Intravenous Every 6 hours 08/28/14 1150 08/28/14 2128   08/28/14 0600  ceFAZolin (ANCEF) IVPB 2 g/50 mL premix     2 g100 mL/hr over 30 Minutes Intravenous On call to O.R. 08/27/14 1352 08/28/14 0855       Patient was given sequential  compression devices, early ambulation, and chemoprophylaxis to prevent DVT.  Patient benefited maximally from hospital stay and there were no complications.    Recent vital signs:  Patient Vitals for the past 24 hrs:  BP Temp Temp src Pulse Resp SpO2  08/31/14 0546 114/60 mmHg 98.2 F (36.8 C) Oral 93 - 100 %  08/31/14 0400 - - - - 18 100 %  08/31/14 0000 - - - - 18 96 %  08/30/14 2138 (!) 104/52 mmHg 98.8 F (37.1 C) Oral 97 - 96 %  08/30/14 2000 - - - - 18 96 %  08/30/14 1820 (!) 105/47 mmHg 99.3 F (37.4 C) Oral 100 18 96 %     Recent laboratory studies:   Recent Labs  08/30/14 0511 08/31/14 0449  WBC 9.3 7.7  HGB 9.7* 9.5*  HCT 29.1* 29.0*  PLT 241 257  NA 136 139  K 4.1 3.5  CL 104 107  CO2 25 24  BUN 8 13  CREATININE 0.75 0.66  GLUCOSE 133* 110*  CALCIUM 8.6 8.2*     Discharge Medications:     Medication List    STOP taking these medications        Fish Oil 1000 MG Caps     Flax Seed Oil 1000 MG Caps     OVER THE COUNTER MEDICATION  Red Yeast Rice 600 MG Caps      TAKE these medications        amLODipine 5 MG tablet  Commonly known as:  NORVASC  Take 5 mg by mouth daily.     benazepril-hydrochlorthiazide 20-12.5 MG per tablet  Commonly known as:  LOTENSIN HCT  Take 1 tablet by mouth daily.     bisacodyl 5 MG EC tablet  Commonly known as:  DULCOLAX  Take 1 tablet (5 mg total) by mouth daily as needed for moderate constipation.     Cinnamon 500 MG Tabs  Take 1,000 mg by mouth 2 (two) times daily at 10 AM and 5 PM.     Co Q 10 10 MG Caps  Take 10 mg by mouth 2 (two) times daily.     enoxaparin 30 MG/0.3ML injection  Commonly known as:  LOVENOX  Inject 0.3 mLs (30 mg total) into the skin every 12 (twelve) hours.     fexofenadine 180 MG tablet  Commonly known as:  ALLEGRA  Take 180 mg by mouth daily.     levothyroxine 100 MCG tablet  Commonly known as:  SYNTHROID, LEVOTHROID  Take 100 mcg by mouth daily before breakfast.      methocarbamol 500 MG tablet  Commonly known as:  ROBAXIN  Take 1 tablet (500 mg total) by mouth 4 (four) times daily.     omeprazole 40 MG capsule  Commonly known as:  PRILOSEC  Take 40 mg by mouth 2 (two) times daily.     ondansetron 4 MG tablet  Commonly known as:  ZOFRAN  Take 1 tablet (4 mg total) by mouth every 8 (eight) hours as needed for nausea or vomiting.     oxyCODONE-acetaminophen 5-325 MG per tablet  Commonly known as:  ROXICET  Take 1-2 tablets by mouth every 4 (four) hours as needed.     Propylene Glycol 0.6 % Soln  Place 1 drop into both eyes 4 (four) times daily.     sodium chloride 0.65 % Soln nasal spray  Commonly known as:  OCEAN  Place 1 spray into both nostrils as needed for congestion.     triamcinolone 55 MCG/ACT Aero nasal inhaler  Commonly known as:  NASACORT  Place 2 sprays into both nostrils at bedtime.     Vitamin D3 5000 UNITS Caps  Take 5,000 Units by mouth daily.        Diagnostic Studies: Dg Knee Right Port  08/28/2014   CLINICAL DATA:  Osteoarthritis of right knee.  EXAM: PORTABLE RIGHT KNEE - 1-2 VIEW  COMPARISON:  MRI dated 10/03/2008  FINDINGS: The components of the total knee prosthesis appear in good position. Soft tissue drain in place. No fractures.  IMPRESSION: Satisfactory appearance of the right knee after total knee replacement.   Electronically Signed   By: Rozetta Nunnery M.D.   On: 08/28/2014 11:04    Disposition: 01-Home or Self Care  Discharge Instructions    CPM    Complete by:  As directed   Continuous passive motion machine (CPM):      Use the CPM from 0- to 60 for 6 hours per day.      You may increase by 10 per day.  You may break it up into 2 or 3 sessions per day.      Use CPM for 2-3 weeks or until you are told to stop.     Call MD / Call 911    Complete by:  As  directed   If you experience chest pain or shortness of breath, CALL 911 and be transported to the hospital emergency room.  If you develope a fever above  101 F, pus (white drainage) or increased drainage or redness at the wound, or calf pain, call your surgeon's office.     Change dressing    Complete by:  As directed   Change dressing on Saturday, then change the dressing daily with sterile 4 x 4 inch gauze dressing and apply TED hose.  You may clean the incision with alcohol prior to redressing.     Constipation Prevention    Complete by:  As directed   Drink plenty of fluids.  Prune juice may be helpful.  You may use a stool softener, such as Colace (over the counter) 100 mg twice a day.  Use MiraLax (over the counter) for constipation as needed.     Diet - low sodium heart healthy    Complete by:  As directed      Discharge instructions    Complete by:  As directed   Weight bearing as tolerated.  Administer Lovenox as directed for a total of 7 days after surgery to prevent blood clots.  Change bandage daily starting on Saturday. May shower on Monday, but do not soak incision.  May apply ice for up to 20 minutes at a time for pain and swelling.  Follow up appointment in two weeks.     Do not put a pillow under the knee. Place it under the heel.    Complete by:  As directed   Place gray foam under operative heel when in bed or in a chair to work on extension     Increase activity slowly as tolerated    Complete by:  As directed      TED hose    Complete by:  As directed   Use stockings (TED hose) for 2 weeks on both leg(s).  You may remove them at night for sleeping.           Follow-up Information    Follow up with [facility], MD. Schedule an appointment as soon as possible for a visit in 2 weeks.   Specialty:  Orthopedic Surgery   Contact information:   [address] [address] [phone]        Signed: Ermalene Postin. Mendel Ryder 08/31/2014, 7:34 AM

## 2014-08-28 NOTE — Interval H&P Note (Signed)
History and Physical Interval Note:  08/28/2014 8:25 AM  Stacey Coleman  has presented today for surgery, with the diagnosis of DJD RIGHT KNEE  The various methods of treatment have been discussed with the patient and family. After consideration of risks, benefits and other options for treatment, the patient has consented to  Procedure(s): RIGHT TOTAL KNEE ARTHROPLASTY (Right) as a surgical intervention .  The patient's history has been reviewed, patient examined, no change in status, stable for surgery.  I have reviewed the patient's chart and labs.  Questions were answered to the patient's satisfaction.     [place] Merida F

## 2014-08-28 NOTE — Discharge Instructions (Signed)
Total Knee Replacement, Care After Refer to this sheet in the next few weeks. These instructions provide you with information on caring for yourself after your procedure. Your health care provider also may give you specific instructions. Your treatment has been planned according to the most current medical practices, but problems sometimes occur. Call your health care provider if you have any problems or questions after your procedure. HOME CARE INSTRUCTIONS   Weight bearing as tolerated.  Administer Lovenox as directed for a total of 7 days after surgery to prevent blood clots.  Change bandage daily starting on Saturday. May shower on Monday, but do not soak incision.  May apply ice for up to 20 minutes at a time for pain and swelling.  Follow up appointment in two weeks.    See a physical therapist as directed by your health care provider.  Take medicines only as directed by your health care provider.  Avoid lifting or driving until you are instructed otherwise.  If you have been sent home with a continuous passive motion machine, use it as directed by your health care provider. SEEK MEDICAL CARE IF:  You have difficulty breathing.  You have drainage, redness, swelling, or pain at your incision site.  You have a bad smell coming from your incision site.  You have persistent bleeding from your incision site.  Your incision breaks open after sutures (stitches) or staples have been removed.  You have a fever. SEEK IMMEDIATE MEDICAL CARE IF:   You have a rash.  You have pain or swelling in your calf or thigh.  You have shortness of breath or chest pain.  Your range of motion in your knee is decreasing rather than increasing. MAKE SURE YOU:   Understand these instructions.  Will watch your condition.  Will get help right away if you are not doing well or get worse. Document Released: 01/29/2005 Document Revised: 11/26/2013 Document Reviewed: 08/31/2011 [facility] Patient  Information 2015 [place], [place]. This information is not intended to replace advice given to you by your health care provider. Make sure you discuss any questions you have with your health care provider.

## 2014-08-28 NOTE — Progress Notes (Signed)
Orthopedic Tech Progress Note Patient Details:  Stacey Coleman 1950-09-19 [id number] On cpm at 7:20 pm Patient ID: Stacey Coleman, female   DOB: [birth year], 64 y.o.   MRN: [id number]   Stacey Coleman 08/28/2014, 7:19 PM

## 2014-08-28 NOTE — Anesthesia Postprocedure Evaluation (Signed)
  Anesthesia Post-op Note  Patient: Stacey Coleman  Procedure(s) Performed: Procedure(s): RIGHT TOTAL KNEE ARTHROPLASTY (Right)  Patient Location: PACU  Anesthesia Type:General  Level of Consciousness: awake  Airway and Oxygen Therapy: Patient Spontanous Breathing and Patient connected to nasal cannula oxygen  Post-op Pain: mild  Post-op Assessment: Post-op Vital signs reviewed, Patient's Cardiovascular Status Stable, Respiratory Function Stable, Patent Airway and No signs of Nausea or vomiting  Post-op Vital Signs: Reviewed  Last Vitals:  Filed Vitals:   08/28/14 0646  BP: 125/60  Pulse: 95  Temp: 37.1 C  Resp: 18    Complications: No apparent anesthesia complications

## 2014-08-28 NOTE — Progress Notes (Signed)
Utilization review completed.  

## 2014-08-28 NOTE — Transfer of Care (Signed)
Immediate Anesthesia Transfer of Care Note  Patient: Stacey Coleman  Procedure(s) Performed: Procedure(s): RIGHT TOTAL KNEE ARTHROPLASTY (Right)  Patient Location: PACU  Anesthesia Type:General  Level of Consciousness: awake, alert , oriented and patient cooperative  Airway & Oxygen Therapy: Patient Spontanous Breathing and Patient connected to nasal cannula oxygen  Post-op Assessment: Report given to RN, Post -op Vital signs reviewed and stable, Post -op Vital signs reviewed and unstable, Anesthesiologist notified and Patient moving all extremities  Post vital signs: Reviewed and stable  Last Vitals:  Filed Vitals:   08/28/14 1033  BP:   Pulse:   Temp: 36.7 C  Resp:     Complications: No apparent anesthesia complications

## 2014-08-28 NOTE — Anesthesia Procedure Notes (Signed)
Procedure Name: Intubation Date/Time: 08/28/2014 8:44 AM Performed by: Carney Living Pre-anesthesia Checklist: Patient identified, Emergency Drugs available, Suction available, Patient being monitored and Timeout performed Patient Re-evaluated:Patient Re-evaluated prior to inductionOxygen Delivery Method: Circle system utilized Preoxygenation: Pre-oxygenation with 100% oxygen Intubation Type: IV induction Ventilation: Mask ventilation without difficulty and Oral airway inserted - appropriate to patient size Laryngoscope Size: Mac and 4 Grade View: Grade I Tube type: Oral Tube size: 7.0 mm Number of attempts: 1 Airway Equipment and Method: Stylet Placement Confirmation: ETT inserted through vocal cords under direct vision,  positive ETCO2 and breath sounds checked- equal and bilateral Secured at: 21 cm Tube secured with: Tape Dental Injury: Teeth and Oropharynx as per pre-operative assessment

## 2014-08-28 NOTE — H&P (View-Only) (Signed)
TOTAL KNEE ADMISSION H&P  Patient is being admitted for right total knee arthroplasty.  Subjective:  Chief Complaint:right knee pain.  HPI: Stacey Coleman, 64 y.o. female, has a history of pain and functional disability in the right knee due to arthritis and has failed non-surgical conservative treatments for greater than 12 weeks to includeNSAID's and/or analgesics and corticosteriod injections.  Onset of symptoms was gradual, starting >10 years ago with gradually worsening course since that time. The patient noted prior procedures on the knee to include  arthroscopy, menisectomy and ACL reconstruction on the right knee(s).  Patient currently rates pain in the right knee(s) at 5 out of 10 with activity. Patient has night pain and worsening of pain with activity and weight bearing.  Patient has evidence of subchondral sclerosis and joint space narrowing by imaging studies. There is no active infection.  There are no active problems to display for this patient.  Past Medical History  Diagnosis Date  . Hypertension   . Arthritis   . GERD (gastroesophageal reflux disease)   . Osteoporosis   . Wears partial dentures   . Wears hearing aid     both ears  . Hypothyroidism     Past Surgical History  Procedure Laterality Date  . Knee arthroscopy w/ acl reconstruction  2001    right  . Knee arthroscopy  1990    rt  . Knee arthroscopy  2002    rt  . Bone resection  2004    left humerous  . Ankle arthroscopy  2008    spurs-left  . Colonoscopy    . Knee arthroscopy with medial menisectomy Left 12/13/2013    Procedure: LEFT KNEE ARTHROSCOPY WITH MEDIAL MENISECTOMY, PLICA EXCISION, CHONDROPLASTY;  Surgeon: Ninetta Lights, MD;  Location: [facility];  Service: Orthopedics;  Laterality: Left;     (Not in a hospital admission) No Known Allergies  History  Substance Use Topics  . Smoking status: Never Smoker   . Smokeless tobacco: Not on file  . Alcohol Use: Yes     Comment:  occ    No family history on file.   Review of Systems  Constitutional: Negative.   HENT: Negative.   Eyes: Negative.   Respiratory: Negative.   Cardiovascular: Negative.   Gastrointestinal: Negative.   Genitourinary: Negative.   Musculoskeletal: Positive for joint pain.  Skin: Negative.   Neurological: Negative.   Endo/Heme/Allergies: Negative.   Psychiatric/Behavioral: Negative.     Objective:  Physical Exam  Constitutional: She appears well-developed and well-nourished.  HENT:  Head: Normocephalic and atraumatic.  Eyes: EOM are normal. Pupils are equal, round, and reactive to light.  Neck: Normal range of motion. Neck supple.  Cardiovascular: Normal rate, regular rhythm and normal heart sounds.  Exam reveals no friction rub.   No murmur heard. Respiratory: Effort normal and breath sounds normal. No respiratory distress. She has no wheezes. She has no rales.  GI: Soft. Bowel sounds are normal. She exhibits no distension.  Musculoskeletal:  Exam of her right knee reveals antalgic gait with a Trendelenburg component. Varus thrust. She has range of motion 0-100 degrees. Tenderness to palpation medial and lateral joint line. Moderate patellofemoral crepitus. Negative straight leg raise. She is neurovascularly intact distally.  Neurological: She is alert. She has normal reflexes.  Skin: Skin is warm and dry.  Psychiatric: She has a normal mood and affect. Her behavior is normal. Judgment and thought content normal.    Vital signs in last 24 hours: @  VSRANGES@  Labs:   Estimated body mass index is 30.67 kg/(m^2) as calculated from the following:   Height as of 12/13/13: 5\' 1"  (1.549 m).   Weight as of 12/13/13: 73.596 kg (162 lb 4 oz).   Imaging Review Plain radiographs demonstrate severe degenerative joint disease of the right knee(s). The overall alignment ismild varus. The bone quality appears to be fair for age and reported activity level.  Assessment/Plan:  End  stage arthritis, right knee   The patient history, physical examination, clinical judgment of the provider and imaging studies are consistent with end stage degenerative joint disease of the right knee(s) and total knee arthroplasty is deemed medically necessary. The treatment options including medical management, injection therapy arthroscopy and arthroplasty were discussed at length. The risks and benefits of total knee arthroplasty were presented and reviewed. The risks due to aseptic loosening, infection, stiffness, patella tracking problems, thromboembolic complications and other imponderables were discussed. The patient acknowledged the explanation, agreed to proceed with the plan and consent was signed. Patient is being admitted for inpatient treatment for surgery, pain control, PT, OT, prophylactic antibiotics, VTE prophylaxis, progressive ambulation and ADL's and discharge planning. The patient is planning to be discharged to SNF

## 2014-08-29 ENCOUNTER — Encounter (HOSPITAL_COMMUNITY): Payer: Self-pay | Admitting: Orthopedic Surgery

## 2014-08-29 LAB — CBC
HCT: 31.4 % — ABNORMAL LOW (ref 36.0–46.0)
Hemoglobin: 10.4 g/dL — ABNORMAL LOW (ref 12.0–15.0)
MCH: 29.2 pg (ref 26.0–34.0)
MCHC: 33.1 g/dL (ref 30.0–36.0)
MCV: 88.2 fL (ref 78.0–100.0)
Platelets: 270 10*3/uL (ref 150–400)
RBC: 3.56 MIL/uL — ABNORMAL LOW (ref 3.87–5.11)
RDW: 13.7 % (ref 11.5–15.5)
WBC: 10.2 10*3/uL (ref 4.0–10.5)

## 2014-08-29 LAB — BASIC METABOLIC PANEL
Anion gap: 3 — ABNORMAL LOW (ref 5–15)
BUN: 8 mg/dL (ref 6–23)
CO2: 28 mmol/L (ref 19–32)
Calcium: 8.2 mg/dL — ABNORMAL LOW (ref 8.4–10.5)
Chloride: 99 mmol/L (ref 96–112)
Creatinine, Ser: 0.76 mg/dL (ref 0.50–1.10)
GFR calc Af Amer: >90 mL/min (ref >90)
GFR calc non Af Amer: 87 mL/min — ABNORMAL LOW (ref >90)
Glucose, Bld: 127 mg/dL — ABNORMAL HIGH (ref 70–99)
Potassium: 4.2 mmol/L (ref 3.5–5.1)
Sodium: 130 mmol/L — ABNORMAL LOW (ref 135–145)

## 2014-08-29 NOTE — Progress Notes (Signed)
Physical Therapy Treatment Patient Details Name: Stacey Coleman MRN: [id number] DOB: 1950/12/10 Today's Date: 08/29/2014    History of Present Illness Pt admitted 2/3 for elective R TKA.    PT Comments    Pt very motivated to return to independence. Pt at min guard level for mobility. Able to progress exercises this session. Cont to follow per POC till D/C to [facility]. Pt will need SNF due to decr caregiver (A) at home.   Follow Up Recommendations  SNF;Supervision/Assistance - 24 hour     Equipment Recommendations  Rolling walker with 5" wheels    Recommendations for Other Services       Precautions / Restrictions Precautions Precautions: Knee Precaution Comments: reviewed no pillow under knee Required Braces or Orthoses: Knee Immobilizer - Right Knee Immobilizer - Right: Discontinue once straight leg raise with < 10 degree lag Restrictions Weight Bearing Restrictions: Yes RLE Weight Bearing: Weight bearing as tolerated    Mobility  Bed Mobility Overal bed mobility: Modified Independent             General bed mobility comments: use of handrails  Transfers Overall transfer level: Needs assistance Equipment used: Rolling walker (2 wheeled) Transfers: Sit to/from Stand Sit to Stand: Min guard         General transfer comment: cues for hand placement; min guard to steady  Ambulation/Gait Ambulation/Gait assistance: Min guard Ambulation Distance (Feet): 80 Feet Assistive device: Rolling walker (2 wheeled) Gait Pattern/deviations: Step-through pattern;Decreased stance time - right;Decreased step length - left;Antalgic Gait velocity: decr Gait velocity interpretation: Below normal speed for age/gender General Gait Details: cues for equal stride length and upright posture; min guard to steady primarilly with directional changes    Stairs            Wheelchair Mobility    Modified Rankin (Stroke Patients Only)       Balance Overall balance  assessment: Needs assistance Sitting-balance support: Feet supported;No upper extremity supported Sitting balance-[name]: Good     Standing balance support: During functional activity;Bilateral upper extremity supported Standing balance-[name]: Poor Standing balance comment: RW                    Cognition Arousal/Alertness: Awake/alert Behavior During Therapy: WFL for tasks assessed/performed Overall Cognitive Status: Within Functional Limits for tasks assessed                      Exercises Total Joint Exercises Ankle Circles/Pumps: AROM;Both;10 reps;Supine Quad Sets: AROM;Right;10 reps;Seated Hip ABduction/ADduction: AAROM;Right;10 reps Long Arc Quad: AROM;Right;10 reps;Seated Knee Flexion: AROM;Right;10 reps;Seated Goniometric ROM: 5 to 60 in sitting    General Comments General comments (skin integrity, edema, etc.): denied any dizziness or nausea this session      Pertinent Vitals/Pain Pain Assessment: 0-10 Pain Score: 4  Pain Location: Rt knee Pain Descriptors / Indicators: Aching Pain Intervention(s): Monitored during session;Premedicated before session;Repositioned    Home Living                      Prior Function            PT Goals (current goals can now be found in the care plan section) Acute Rehab PT Goals Patient Stated Goal: to regain independence  PT Goal Formulation: With patient Time For Goal Achievement: 09/04/14 Potential to Achieve Goals: Good Progress towards PT goals: Progressing toward goals    Frequency  7X/week    PT Plan Current plan remains appropriate  Co-evaluation             End of Session Equipment Utilized During Treatment: Gait belt;Right knee immobilizer Activity Tolerance: Patient tolerated treatment well Patient left: in chair;with call bell/phone within reach     Time: 0835-0900 PT Time Calculation (min) (ACUTE ONLY): 25 min  Charges:  $Gait Training: 8-22  mins $Therapeutic Exercise: 8-22 mins                    G CodesGustavus Bryant , [place]  08/29/2014, 9:07 AM

## 2014-08-29 NOTE — Progress Notes (Signed)
08/29/14 PT recomended SNF. Referral made to CSW. CSW working on placement. Will continue to follow until discharge.

## 2014-08-29 NOTE — Progress Notes (Signed)
Orthopedic Tech Progress Note Patient Details:  Stacey Coleman 10/22/1950 [id number] On cpm at 7:25 pm Patient ID: [name], female   DOB: [birth year], 64 y.o.   MRN: [id number]   Braulio Bosch 08/29/2014, 7:26 PM

## 2014-08-29 NOTE — Clinical Social Work Psychosocial (Signed)
Clinical Social Work Department BRIEF PSYCHOSOCIAL ASSESSMENT 08/29/2014  Patient:  Stacey Coleman, Stacey Coleman     Account Number:  [id number]     Admit date:  08/28/2014  Clinical Social Worker:  Durward Fortes, CLINICAL SOCIAL WORKER  Date/Time:  08/29/2014 11:16 AM  Referred by:  Physician  Date Referred:  08/29/2014 Referred for  SNF Placement   Other Referral:   none.   Interview type:  Patient Other interview type:   none.    PSYCHOSOCIAL DATA Living Status:  ALONE Admitted from facility:   Level of care:   Primary support name:  Deondra Labrador Primary support relationship to patient:  CHILD, ADULT Degree of support available:   Adequate support.    CURRENT CONCERNS Current Concerns  Post-Acute Placement   Other Concerns:   none.    SOCIAL WORK ASSESSMENT / PLAN CSW and BSW-intern consulted regarding SNF placement for pt once medically stable for discharge.    BSW-intern informed that pt would like to be discharged to [facility] once medically stable for discharge. BSW-intern met with pt at bedside to confirm pt's choice. Pt informed BSW-intern that pt had completed paperwork at [facility] prior to surgery. Pt expressed to have no stressors in regrads to pt's discharge disposition as pt's disposition was prearranged.    Pt expressed no further questions at this time.   Assessment/plan status:  Psychosocial Support/Ongoing Assessment of Needs Other assessment/ plan:   none.   Information/referral to community resources:   Pt to be discharged to [facility] in [place] on 08/31/2014.    PATIENT'S/FAMILY'S RESPONSE TO PLAN OF CARE: Pt and pt's family agreeable to CSW plan of care. Pt and pt's family expressed no further questions or concerns at this time.       Virgie Dad Greenlee Ancheta, BSW-Intern

## 2014-08-29 NOTE — Op Note (Signed)
NAMEALEXANDERIA, Stacey Coleman NO.:  [id number]  MEDICAL RECORD NO.:  [id number]  LOCATION:  5N21C                        FACILITY:  [facility]  PHYSICIAN:  Ninetta Lights, M.D. DATE OF BIRTH:  [birth year]  DATE OF PROCEDURE:  08/28/2014 DATE OF DISCHARGE:                              OPERATIVE REPORT   PREOPERATIVE DIAGNOSES:  Right knee primary localized end-stage degenerative arthritis.  Previous anterior cruciate ligament reconstruction.  POSTOPERATIVE DIAGNOSES:  Right knee primary localized end-stage degenerative arthritis.  Previous anterior cruciate ligament reconstruction.  Moderate osteopenia especially in her tibia.  PROCEDURE:  Right knee modified, minimally invasive total knee replacement Stryker Triathlon prosthesis.  Soft tissue balancing. Cemented, pegged cruciate retaining #3 femoral component.  Cemented #4 tibial component, 11-mm CS polyethylene insert.  Cemented and resurfacing 32-mm patellar component.  SURGEON:  Ninetta Lights, M.D.  ASSISTANT:  Doran Stabler, PA, present throughout the entire case and necessary for timely completion of procedure.  ANESTHESIA:  General.  BLOOD LOSS:  Minimal.  SPECIMENS:  None.  CULTURES:  None.  COMPLICATIONS:  None.  DRESSINGS:  Soft compressive knee immobilizer.  TOURNIQUET TIME:  1 hour.  PROCEDURE:  The patient was brought to the operating room and placed on the operating table in supine position.  After adequate anesthesia had been obtained, tourniquet applied.  Prepped and draped in usual sterile fashion.  Exsanguinated with elevation of Esmarch.  Tourniquet was inflated to 350 mmHg.  Straight incision above the patella down to tibial tubercle.  Skin and subcutaneous tissue were divided.  Medial arthrotomy, vastus splitting, preserving quad tendon.  Grade 4 changes throughout.  Intramedullary flexible rod, distal femur.  An 8-mm resection, 5 degrees of valgus.  Femur was sized, cut, and fitted  for a pegged cruciate retaining #3 component.  Extramedullary guide on the tibia.  A 3-degree posterior slope cut.  Sized the #4 component. Patella exposed.  Posterior 10-mm was removed.  Drilled, sized, and fitted for a 32-mm component.  Debris was cleared throughout.  Copious irrigation with pulse irrigating device.  Trials were put in place. With the 11-mm insert, very pleased with balancing, motion, stability, alignment and tracking.  Tibia was marked for rotation and hand reamed. All trials were removed.  Copious irrigation with a pulse irrigating device.  Cement prepared and placed on all components, firmly seated. Polyethylene attached to the tibia and knee reduced.  Patella held with a clamp.  Once the cement hardened, the soft tissues were injected with Exparel.  Irrigated again.  Hemovac was placed.  Arthrotomy closed with #1 Vicryl, skin with subcutaneous tissue, subcutaneous and subcuticular closure.  Sterile compressive dressing applied.  Tourniquet deflated and removed.  Knee immobilizer applied.  Anesthesia reversed.  Brought to the recovery room.  Tolerated the surgery well, no complications.     Ninetta Lights, M.D.     DFM/MEDQ  D:  08/28/2014  T:  08/29/2014  Job:  [id number]

## 2014-08-29 NOTE — Evaluation (Signed)
Occupational Therapy Evaluation Patient Details Name: Stacey Coleman MRN: [id number] DOB: Sep 26, 1950 Today's Date: 08/29/2014    History of Present Illness Pt admitted 2/3 for elective R TKA.   Clinical Impression   Pt admitted with the above diagnoses and presents with below problem list. Pt will benefit from continued acute OT to address the below listed deficits and maximize independence with basic ADLs prior to d/c to next venue. PTA pt was independent with ADLs. Pt currently at min guard level for LB ADLs. Recommend SNF at d/c due to decreased caregiver support (pt live alone) and inaccessible home environment (bedroom and shower on 2nd level).     Follow Up Recommendations  SNF    Equipment Recommendations  Other (comment) (defer to next venue)    Recommendations for Other Services       Precautions / Restrictions Precautions Precautions: Knee Precaution Comments: reviewed precautions  Required Braces or Orthoses: Knee Immobilizer - Right Knee Immobilizer - Right: Discontinue once straight leg raise with < 10 degree lag Restrictions Weight Bearing Restrictions: Yes RLE Weight Bearing: Weight bearing as tolerated      Mobility Bed Mobility              General bed mobility comments: in recliner  Transfers Overall transfer level: Needs assistance Equipment used: Rolling walker (2 wheeled) Transfers: Sit to/from Stand Sit to Stand: Min guard         General transfer comment: cues for technique    Balance Overall balance assessment: Needs assistance Sitting-balance support: Feet supported;No upper extremity supported Sitting balance-[name]: Good     Standing balance support: Bilateral upper extremity supported;During functional activity Standing balance-[name]: Poor Standing balance comment: needs rw for balance                            ADL Overall ADL's : Needs assistance/impaired Eating/Feeding: Set up;Sitting   Grooming:  Set up;Sitting;Standing   Upper Body Bathing: Set up;Sitting   Lower Body Bathing: Min guard;With adaptive equipment;Sit to/from stand   Upper Body Dressing : Set up;Sitting   Lower Body Dressing: Min guard;With adaptive equipment;Sit to/from stand   Toilet Transfer: Min guard;Ambulation;RW;BSC   Toileting- Water quality scientist and Hygiene: Min guard;Sit to/from stand   Tub/ Shower Transfer: Min guard;3 in 1;Ambulation;Rolling walker   Functional mobility during ADLs: Min guard;Rolling walker General ADL Comments: Pt ambulated household distance at min guard level. toilet/shower transfer completed at min guard level. Provided ADL education and discussed AE.      Vision                     Perception     Praxis      Pertinent Vitals/Pain Pain Assessment: 0-10 Pain Score: 3  Pain Location: Rt knee Pain Descriptors / Indicators: Aching Pain Intervention(s): Monitored during session     Hand Dominance Right   Extremity/Trunk Assessment Upper Extremity Assessment Upper Extremity Assessment: Overall WFL for tasks assessed   Lower Extremity Assessment Lower Extremity Assessment: Defer to PT evaluation       Communication Communication Communication: No difficulties   Cognition Arousal/Alertness: Awake/alert Behavior During Therapy: WFL for tasks assessed/performed Overall Cognitive Status: Within Functional Limits for tasks assessed                     General Comments       Exercises      Shoulder Instructions  Home Living Family/patient expects to be discharged to:: Skilled nursing facility Living Arrangements: Alone                                      Prior Functioning/Environment Level of Independence: Independent             OT Diagnosis: Acute pain   OT Problem List: Impaired balance (sitting and/or standing);Decreased knowledge of use of DME or AE;Decreased knowledge of precautions;Pain   OT  Treatment/Interventions: Self-care/ADL training;DME and/or AE instruction;Therapeutic activities;Patient/family education;Balance training    OT Goals(Current goals can be found in the care plan section) Acute Rehab OT Goals Patient Stated Goal: not stated OT Goal Formulation: With patient Time For Goal Achievement: 09/05/14 Potential to Achieve Goals: Good ADL Goals Pt Will Perform Grooming: with modified independence;standing Pt Will Perform Lower Body Bathing: with modified independence;with adaptive equipment;sit to/from stand Pt Will Perform Lower Body Dressing: with modified independence;with adaptive equipment;sit to/from stand  OT Frequency: Min 2X/week   Barriers to D/C: Inaccessible home environment;Decreased caregiver support  Pt lives alone. Bed and shower are on second level up a flight of stairs.       Co-evaluation              End of Session Equipment Utilized During Treatment: Gait belt;Rolling walker;Right knee immobilizer CPM Right Knee Additional Comments: zero knee on  Activity Tolerance: Patient tolerated treatment well Patient left: in chair;with call bell/phone within reach   Time: 0907-0930 OT Time Calculation (min): 23 min Charges:  OT General Charges $OT Visit: 1 Procedure OT Evaluation $Initial OT Evaluation Tier I: 1 Procedure OT Treatments $Self Care/Home Management : 8-22 mins G-Codes:    Stacey Coleman 2014/08/31, 9:42 AM

## 2014-08-29 NOTE — Progress Notes (Signed)
Physical Therapy Treatment Patient Details Name: Stacey Coleman MRN: [id number] DOB: 23-May-1951 Today's Date: 08/29/2014    History of Present Illness Pt admitted 2/3 for elective R TKA.    PT Comments    Pt mobilizing at supervision to min guard level. Pt very motivated to increase theraex for ROM and strengthening. Pt does not have (A) at home; plans to D/C to [facility] Saturday.   Follow Up Recommendations  SNF;Supervision/Assistance - 24 hour     Equipment Recommendations  Rolling walker with 5" wheels    Recommendations for Other Services       Precautions / Restrictions Precautions Precautions: Knee Precaution Comments: reviewed precautions  Knee Immobilizer - Right: Other (comment) (D/c KI; pt able to perform SLR x 5) Restrictions Weight Bearing Restrictions: Yes RLE Weight Bearing: Weight bearing as tolerated    Mobility  Bed Mobility               General bed mobility comments: up in chair  Transfers Overall transfer level: Needs assistance Equipment used: Rolling walker (2 wheeled) Transfers: Sit to/from Stand Sit to Stand: Min guard         General transfer comment: cues for hand placement; min guard to steady   Ambulation/Gait Ambulation/Gait assistance: Min guard Ambulation Distance (Feet): 115 Feet Assistive device: Rolling walker (2 wheeled) Gait Pattern/deviations: Step-through pattern;Decreased stance time - right;Decreased step length - left;Antalgic;Narrow base of support Gait velocity: decr Gait velocity interpretation: Below normal speed for age/gender General Gait Details: cues for upright posture and equal stride length; pt with narrow BOS at baseline per pt; no Lt LE buckling noted   Stairs            Wheelchair Mobility    Modified Rankin (Stroke Patients Only)       Balance Overall balance assessment: Needs assistance Sitting-balance support: Feet supported;No upper extremity supported Sitting balance-[name]:  Good     Standing balance support: During functional activity;Bilateral upper extremity supported Standing balance-[name]: Poor Standing balance comment: RW to balance                     Cognition Arousal/Alertness: Awake/alert Behavior During Therapy: WFL for tasks assessed/performed Overall Cognitive Status: Within Functional Limits for tasks assessed                      Exercises Total Joint Exercises Ankle Circles/Pumps: AROM;Both;10 reps;Supine Quad Sets: AROM;Right;10 reps;Seated Heel Slides: AAROM;Right;10 reps;Seated Hip ABduction/ADduction: AAROM;Right;10 reps Long Arc Quad: AROM;Right;10 reps;Seated    General Comments        Pertinent Vitals/Pain Pain Assessment: 0-10 Pain Score:  ("2.5") Pain Location: Rt knee Pain Descriptors / Indicators: Sore Pain Intervention(s): Monitored during session;Premedicated before session;Repositioned;Ice applied    Home Living                      Prior Function            PT Goals (current goals can now be found in the care plan section) Acute Rehab PT Goals Patient Stated Goal: to go to rehab then home  PT Goal Formulation: With patient Time For Goal Achievement: 09/04/14 Potential to Achieve Goals: Good Progress towards PT goals: Progressing toward goals    Frequency  7X/week    PT Plan Current plan remains appropriate    Co-evaluation             End of Session Equipment Utilized During Treatment: Gait  belt Activity Tolerance: Patient tolerated treatment well Patient left: in chair;with call bell/phone within reach     Time: 1430-1459 PT Time Calculation (min) (ACUTE ONLY): 29 min  Charges:  $Gait Training: 8-22 mins $Therapeutic Exercise: 8-22 mins                    G CodesGustavus Bryant, [place] 08/29/2014, 4:43 PM

## 2014-08-29 NOTE — Progress Notes (Signed)
Subjective: 1 Day Post-Op Procedure(s) (LRB): RIGHT TOTAL KNEE ARTHROPLASTY (Right) Patient reports pain as moderate.  Patient reports mild nausea after working with PT yesterday afternoon.  No vomiting.  Nausea appears to have resolved today.  No lightheadedness/dizziness, chest pain/sob.  Negative flatus/bm.  Tolerating diet.    Objective: Vital signs in last 24 hours: Temp:  [98 F (36.7 C)-100.3 F (37.9 C)] 100.3 F (37.9 C) (02/04 0555) Pulse Rate:  [62-89] 89 (02/04 0555) Resp:  [10-22] 16 (02/04 0555) BP: (108-151)/(65-89) 133/70 mmHg (02/04 0555) SpO2:  [96 %-100 %] 98 % (02/04 0555)  Intake/Output from previous day: 02/03 0701 - 02/04 0700 In: 2290 [P.O.:240; I.V.:2000; IV Piggyback:50] Out: 541 [Urine:251; Drains:290] Intake/Output this shift:     Recent Labs  08/28/14 1230  HGB 12.4    Recent Labs  08/28/14 1230  WBC 14.5*  RBC 4.28  HCT 37.9  PLT 295    Recent Labs  08/28/14 1230  CREATININE 0.75   No results for input(s): LABPT, INR in the last 72 hours.  Neurologically intact Neurovascular intact Sensation intact distally Intact pulses distally Dorsiflexion/Plantar flexion intact Compartment soft  hemovac drain pulled by me today Negative homans bilaterally  Assessment/Plan: 1 Day Post-Op Procedure(s) (LRB): RIGHT TOTAL KNEE ARTHROPLASTY (Right) Advance diet Up with therapy D/C IV fluids Discharge to SNF Saturday WBAT LLE ABLA-mild and stable Dry dressing change prn   Stacey Coleman 08/29/2014, 7:15 AM

## 2014-08-29 NOTE — Clinical Social Work Placement (Signed)
Clinical Social Work Department CLINICAL SOCIAL WORK PLACEMENT NOTE 08/29/2014  Patient:  JADEE, GOLEBIEWSKI  Account Number:  [id number] Admit date:  08/28/2014  Clinical Social Worker:  Durward Fortes, CLINICAL SOCIAL WORKER  Date/time:  08/29/2014 11:39 AM  Clinical Social Work is seeking post-discharge placement for this patient at the following level of care:   [facility]   (*CSW will update this form in Epic as items are completed)   08/29/2014  Patient/family provided with [place] Department of Clinical Social Work's list of facilities offering this level of care within the geographic area requested by the patient (or if unable, by the patient's family).  08/29/2014  Patient/family informed of their freedom to choose among providers that offer the needed level of care, that participate in Medicare, Medicaid or managed care program needed by the patient, have an available bed and are willing to accept the patient.  08/29/2014  Patient/family informed of [facility]' ownership interest in [facility], as well as of the fact that they are under no obligation to receive care at this facility.  PASARR submitted to EDS on 08/29/2014 PASARR number received on 08/29/2014  FL2 transmitted to all facilities in geographic area requested by pt/family on  08/29/2014 FL2 transmitted to all facilities within larger geographic area on   Patient informed that his/her managed care company has contracts with or will negotiate with  certain facilities, including the following:     Patient/family informed of bed offers received:  08/29/2014 Patient chooses bed at [facility] Physician recommends and patient chooses bed at    Patient to be transferred to [facility] on  08/31/2014 Patient to be transferred to facility by FAMILY Patient and family notified of transfer on 08/29/2014 Name of family member notified:    The following physician request were entered in  Epic:   Additional Comments:   Siomara Burkel S. Rhythm Wigfall, BSW-Intern

## 2014-08-30 LAB — CBC
HCT: 29.1 % — ABNORMAL LOW (ref 36.0–46.0)
Hemoglobin: 9.7 g/dL — ABNORMAL LOW (ref 12.0–15.0)
MCH: 29.4 pg (ref 26.0–34.0)
MCHC: 33.3 g/dL (ref 30.0–36.0)
MCV: 88.2 fL (ref 78.0–100.0)
Platelets: 241 10*3/uL (ref 150–400)
RBC: 3.3 MIL/uL — ABNORMAL LOW (ref 3.87–5.11)
RDW: 13.7 % (ref 11.5–15.5)
WBC: 9.3 10*3/uL (ref 4.0–10.5)

## 2014-08-30 LAB — BASIC METABOLIC PANEL
Anion gap: 7 (ref 5–15)
BUN: 8 mg/dL (ref 6–23)
CO2: 25 mmol/L (ref 19–32)
Calcium: 8.6 mg/dL (ref 8.4–10.5)
Chloride: 104 mmol/L (ref 96–112)
Creatinine, Ser: 0.75 mg/dL (ref 0.50–1.10)
GFR calc Af Amer: >90 mL/min (ref >90)
GFR calc non Af Amer: 87 mL/min — ABNORMAL LOW (ref >90)
Glucose, Bld: 133 mg/dL — ABNORMAL HIGH (ref 70–99)
Potassium: 4.1 mmol/L (ref 3.5–5.1)
Sodium: 136 mmol/L (ref 135–145)

## 2014-08-30 NOTE — Progress Notes (Signed)
Physical Therapy Treatment Patient Details Name: Stacey Coleman MRN: [id number] DOB: 18-Feb-1951 Today's Date: 08/30/2014    History of Present Illness Pt admitted 2/3 for elective R TKA.    PT Comments    Pt. Was very motivated to do therapeutic exercise and questioned me on the parameters. Pt.'s pain increased after ambulation to a 4/10. Showed more fluid gait with verbal cues. Pt. Appropriate for SNF stay  At [facility] for ongoing therapy needs to return to PLOF.  Follow Up Recommendations  SNF;Supervision/Assistance - 24 hour     Equipment Recommendations  Rolling walker with 5" wheels    Recommendations for Other Services       Precautions / Restrictions Precautions Precautions: Knee Precaution Comments: reviewed precautions  Required Braces or Orthoses: Knee Immobilizer - Right Knee Immobilizer - Right: Other (comment) Restrictions Weight Bearing Restrictions: Yes RLE Weight Bearing: Weight bearing as tolerated    Mobility  Bed Mobility               General bed mobility comments: up in chair  Transfers Overall transfer level: Modified independent Equipment used: Rolling walker (2 wheeled) Transfers: Sit to/from [facility] Sit to Stand: Modified independent (Device/Increase time) Stand pivot transfers: Min guard       General transfer comment: min guard for safety  Ambulation/Gait Ambulation/Gait assistance: Supervision Ambulation Distance (Feet): 120 Feet Assistive device: Rolling walker (2 wheeled) Gait Pattern/deviations: Step-through pattern;Decreased stance time - right;Decreased step length - left;Antalgic;Narrow base of support Gait velocity: decr Gait velocity interpretation: Below normal speed for age/gender General Gait Details: cues for upright posture and equal stride length; pt with narrow BOS at baseline per pt; no Lt LE buckling noted   Stairs            Wheelchair Mobility    Modified Rankin (Stroke Patients  Only)       Balance Overall balance assessment: Needs assistance Sitting-balance support: Feet supported;No upper extremity supported Sitting balance-[name]: Good     Standing balance support: During functional activity;Bilateral upper extremity supported Standing balance-[name]: Poor Standing balance comment: RW to balance, narrow BOS eyes closed sway, with moderate perturbation LOB x 1. Wide BOS handle moderate challenges                    Cognition Arousal/Alertness: Awake/alert Behavior During Therapy: WFL for tasks assessed/performed Overall Cognitive Status: Within Functional Limits for tasks assessed                      Exercises Total Joint Exercises Ankle Circles/Pumps: 20 reps;Seated;AROM Quad Sets: AROM;20 reps;Seated Gluteal Sets: AROM;Seated;10 reps;Both Heel Slides: AAROM;Seated;Right;20 reps Hip ABduction/ADduction: AROM;Seated;Right;20 reps Long Arc Quad: Seated;Right;20 reps;AROM    General Comments        Pertinent Vitals/Pain Pain Score: 2  Pain Location: Rt knee Pain Descriptors / Indicators: Sore Pain Intervention(s): Monitored during session;Repositioned    Home Living                      Prior Function            PT Goals (current goals can now be found in the care plan section) Progress towards PT goals: Progressing toward goals    Frequency  7X/week    PT Plan Current plan remains appropriate    Co-evaluation             End of Session Equipment Utilized During Treatment: Gait belt Activity Tolerance:  Patient tolerated treatment well Patient left: in chair;with call bell/phone within reach     Time: 0815-0850 PT Time Calculation (min) (ACUTE ONLY): 35 min  Charges:                       G Codes:      [place], [place], SPTA 08/30/2014, 9:15 AM

## 2014-08-30 NOTE — Progress Notes (Signed)
Subjective: 2 Days Post-Op Procedure(s) (LRB): RIGHT TOTAL KNEE ARTHROPLASTY (Right) Patient reports pain as mild.  No nausea/vomiting, lightheadedness/dizziness, chest pain/sob.  Positive flatus but no bm.  Tolerating diet.   Objective: Vital signs in last 24 hours: Temp:  [98.5 F (36.9 C)-99.1 F (37.3 C)] 98.5 F (36.9 C) (02/05 0532) Pulse Rate:  [92-96] 95 (02/05 0532) Resp:  [16-18] 16 (02/05 0532) BP: (97-119)/(54-60) 97/54 mmHg (02/05 0532) SpO2:  [94 %-98 %] 94 % (02/05 0532)  Intake/Output from previous day: 02/04 0701 - 02/05 0700 In: 2116.7 [P.O.:850; I.V.:1266.7] Out: -  Intake/Output this shift:     Recent Labs  08/28/14 1230 08/29/14 0705 08/30/14 0511  HGB 12.4 10.4* 9.7*    Recent Labs  08/29/14 0705 08/30/14 0511  WBC 10.2 9.3  RBC 3.56* 3.30*  HCT 31.4* 29.1*  PLT 270 241    Recent Labs  08/29/14 0705 08/30/14 0511  NA 130* 136  K 4.2 4.1  CL 99 104  CO2 28 25  BUN 8 8  CREATININE 0.76 0.75  GLUCOSE 127* 133*  CALCIUM 8.2* 8.6   No results for input(s): LABPT, INR in the last 72 hours.  Neurologically intact Neurovascular intact Sensation intact distally Intact pulses distally Dorsiflexion/Plantar flexion intact Incision: dressing C/D/I No cellulitis present Compartment soft  Negative homans bilaterally  Assessment/Plan: 2 Days Post-Op Procedure(s) (LRB): RIGHT TOTAL KNEE ARTHROPLASTY (Right) Advance diet Up with therapy Plan for discharge tomorrow to SNF WBAT RLE ABLA-mild but stable Dressing changed by me today Dry dressing change prn  Jacqualine Mau 08/30/2014, 7:47 AM

## 2014-08-30 NOTE — Clinical Social Work Note (Signed)
Patient's discharge summary faxed to [facility] in anticipation of possible weekend discharge (08/31/2014). Weekend CSW to continue to follow and assist with weekend discharge if patient medically stable.  Lubertha Sayres, [place] ([phone]) Licensed Clinical Social Worker Orthopedics [phone]) and Surgical [phone])

## 2014-08-31 LAB — CBC
HCT: 29 % — ABNORMAL LOW (ref 36.0–46.0)
Hemoglobin: 9.5 g/dL — ABNORMAL LOW (ref 12.0–15.0)
MCH: 29.1 pg (ref 26.0–34.0)
MCHC: 32.8 g/dL (ref 30.0–36.0)
MCV: 88.7 fL (ref 78.0–100.0)
Platelets: 257 10*3/uL (ref 150–400)
RBC: 3.27 MIL/uL — ABNORMAL LOW (ref 3.87–5.11)
RDW: 13.9 % (ref 11.5–15.5)
WBC: 7.7 10*3/uL (ref 4.0–10.5)

## 2014-08-31 LAB — BASIC METABOLIC PANEL
Anion gap: 8 (ref 5–15)
BUN: 13 mg/dL (ref 6–23)
CO2: 24 mmol/L (ref 19–32)
Calcium: 8.2 mg/dL — ABNORMAL LOW (ref 8.4–10.5)
Chloride: 107 mmol/L (ref 96–112)
Creatinine, Ser: 0.66 mg/dL (ref 0.50–1.10)
GFR calc Af Amer: >90 mL/min (ref >90)
GFR calc non Af Amer: >90 mL/min (ref >90)
Glucose, Bld: 110 mg/dL — ABNORMAL HIGH (ref 70–99)
Potassium: 3.5 mmol/L (ref 3.5–5.1)
Sodium: 139 mmol/L (ref 135–145)

## 2014-08-31 NOTE — Progress Notes (Signed)
Physical Therapy Treatment Patient Details Name: Stacey Coleman MRN: [id number] DOB: [birth year] Today's Date: 08/31/2014    History of Present Illness Pt admitted 2/3 for elective R TKA.    PT Comments    Pt continues to make great progress and to d/c to SNF today.   Follow Up Recommendations  SNF;Supervision/Assistance - 24 hour     Equipment Recommendations  Rolling walker with 5" wheels    Recommendations for Other Services       Precautions / Restrictions Precautions Precautions: Knee Precaution Booklet Issued: Yes (comment) Precaution Comments: Exercise Handout given Required Braces or Orthoses:  (No KI due to pt able to complete SLR x 10 with no lag) Knee Immobilizer - Right: Discontinue once straight leg raise with < 10 degree lag Restrictions Weight Bearing Restrictions: Yes RLE Weight Bearing: Weight bearing as tolerated    Mobility  Bed Mobility                  Transfers Overall transfer level: Modified independent Equipment used: Rolling walker (2 wheeled) Transfers: Sit to/from Stand Sit to Stand: Modified independent (Device/Increase time)            Ambulation/Gait Ambulation/Gait assistance: Modified independent (Device/Increase time) Ambulation Distance (Feet): 120 Feet Assistive device: Rolling walker (2 wheeled) Gait Pattern/deviations: Step-through pattern Gait velocity: decrease due to pain       Stairs            Wheelchair Mobility    Modified Rankin (Stroke Patients Only)       Balance                                    Cognition Arousal/Alertness: Awake/alert Behavior During Therapy: WFL for tasks assessed/performed Overall Cognitive Status: Within Functional Limits for tasks assessed                      Exercises Total Joint Exercises Quad Sets: AROM;20 reps;Seated Heel Slides: AAROM;Seated;Right;20 reps Hip ABduction/ADduction: AROM;Seated;Right;20 reps Straight Leg Raises:  AAROM;Strengthening;10 reps Long Arc Quad: Seated;Right;20 reps;AROM    General Comments        Pertinent Vitals/Pain Pain Assessment: 0-10 Pain Score: 2  Pain Location: right knee Pain Descriptors / Indicators: Aching Pain Intervention(s): Patient requesting pain meds-RN notified    Home Living                      Prior Function            PT Goals (current goals can now be found in the care plan section) Acute Rehab PT Goals Patient Stated Goal: to go to rehab then home  PT Goal Formulation: With patient Time For Goal Achievement: 09/04/14 Potential to Achieve Goals: Good Progress towards PT goals: Progressing toward goals    Frequency  7X/week    PT Plan Current plan remains appropriate    Co-evaluation             End of Session Equipment Utilized During Treatment: Gait belt Activity Tolerance: Patient tolerated treatment well Patient left: in chair;with call bell/phone within reach     Time: 9379-0240 PT Time Calculation (min) (ACUTE ONLY): 27 min  Charges:  $Gait Training: 8-22 mins $Therapeutic Exercise: 8-22 mins                    G Codes:      Mart Colpitts  08/31/2014, 9:38 AM   Antoine Poche, [place] DPT [phone]

## 2014-08-31 NOTE — Clinical Social Work Note (Signed)
CSW made aware patient ready for d/c to [facility]. CSW contacted facility and who confirmed bed availability. CSW met with patient and family who was present at bedside. Family and patient agreeable to d/c. CSW made patient's RN Joy aware of number for report and d/c packet was placed in patient's shadow chart. Patient's family to transport patient to facility. No further needs. CSW signing off.  [place], [place] Weekend Clinical Social Worker [phone]

## 2014-08-31 NOTE — Progress Notes (Signed)
Subjective: 3 Days Post-Op Procedure(s) (LRB): RIGHT TOTAL KNEE ARTHROPLASTY (Right) Patient reports pain as mild.  No nausea/vomiting, lightheadedness/dizziness, chest pain/sob.  Positive flatus but no bm as of yet.  Tolerating diet.  Eager to be d/c to SNF today.  Objective: Vital signs in last 24 hours: Temp:  [98.2 F (36.8 C)-99.3 F (37.4 C)] 98.2 F (36.8 C) (02/06 0546) Pulse Rate:  [93-100] 93 (02/06 0546) Resp:  [18] 18 (02/06 0400) BP: (104-114)/(47-60) 114/60 mmHg (02/06 0546) SpO2:  [96 %-100 %] 100 % (02/06 0546)  Intake/Output from previous day: 02/05 0701 - 02/06 0700 In: 720 [P.O.:720] Out: -  Intake/Output this shift:     Recent Labs  08/28/14 1230 08/29/14 0705 08/30/14 0511 08/31/14 0449  HGB 12.4 10.4* 9.7* 9.5*    Recent Labs  08/30/14 0511 08/31/14 0449  WBC 9.3 7.7  RBC 3.30* 3.27*  HCT 29.1* 29.0*  PLT 241 257    Recent Labs  08/30/14 0511 08/31/14 0449  NA 136 139  K 4.1 3.5  CL 104 107  CO2 25 24  BUN 8 13  CREATININE 0.75 0.66  GLUCOSE 133* 110*  CALCIUM 8.6 8.2*   No results for input(s): LABPT, INR in the last 72 hours.  Neurologically intact Neurovascular intact Sensation intact distally Intact pulses distally Dorsiflexion/Plantar flexion intact Compartment soft  Negative homans bilaterally  Assessment/Plan: 3 Days Post-Op Procedure(s) (LRB): RIGHT TOTAL KNEE ARTHROPLASTY (Right) Advance diet Up with therapy Discharge to SNF today WBAT RLE ABLA-mild and stable Dry dressing change prn  Venida Jarvis, M. Mendel Ryder 08/31/2014, 7:52 AM

## 2014-08-31 NOTE — Progress Notes (Signed)
Patient ready for discharge. Family (son and daughter-in-law) will provide transportation to [facility]. Report given to Gae Bon, nurse at [facility].

## 2014-08-31 NOTE — Plan of Care (Signed)
Problem: Consults Goal: Diagnosis- Total Joint Replacement Outcome: Completed/Met Date Met:  08/31/14 Primary Total Knee right

## 2014-09-01 NOTE — Clinical Social Work Note (Signed)
Patient to be transferred to [facility] on 08/31/2014-Stacey Coleman, [place] Patient to be transferred to facility by FAMILY Patient and family notified of transfer on 08/29/2014 Name of family member notified:Patient's family present at bedside.   The following physician request were entered in Epic:   Additional Comments:  [place], [place] Weekend Clinical Social Worker [phone]

## 2014-09-02 ENCOUNTER — Non-Acute Institutional Stay (SKILLED_NURSING_FACILITY): Payer: Medicare Other | Admitting: Adult Health

## 2014-09-02 DIAGNOSIS — K219 Gastro-esophageal reflux disease without esophagitis: Secondary | ICD-10-CM

## 2014-09-02 DIAGNOSIS — J309 Allergic rhinitis, unspecified: Secondary | ICD-10-CM

## 2014-09-02 DIAGNOSIS — I10 Essential (primary) hypertension: Secondary | ICD-10-CM

## 2014-09-02 DIAGNOSIS — D62 Acute posthemorrhagic anemia: Secondary | ICD-10-CM

## 2014-09-02 DIAGNOSIS — E039 Hypothyroidism, unspecified: Secondary | ICD-10-CM

## 2014-09-02 DIAGNOSIS — M1711 Unilateral primary osteoarthritis, right knee: Secondary | ICD-10-CM

## 2014-09-03 ENCOUNTER — Encounter: Payer: Self-pay | Admitting: Adult Health

## 2014-09-03 NOTE — Progress Notes (Signed)
Patient ID: Stacey Coleman, female   DOB: [birth year], 64 y.o.   MRN: [id number]    09/02/14  Facility:  Nursing Home Location:  [place] Room Number: 419-Q LEVEL OF CARE:  SNF (31)   Chief Complaint  Patient presents with  . Hospitalization Follow-up    DJD S/P right total knee arthroplasty, hypertension, allergic rhinitis, hypothyroidism, GERD and anemia    HISTORY OF PRESENT ILLNESS:  This is a 64 year old female who has been admitted to [facility] on 08/31/14 from [facility]. She has past medical history of osteoporosis, hypothyroidism, chondral sarcoma of left humerus and prediabetes. 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 She has been admitted for a short-term rehabilitation.  PAST MEDICAL HISTORY:  Past Medical History  Diagnosis Date  . Hypertension   . Arthritis   . GERD (gastroesophageal reflux disease)   . Osteoporosis   . Wears partial dentures   . Wears hearing aid     both ears  . Hypothyroidism   . Hypothyroid   . Seasonal allergies   . Laryngotracheal cleft     laryngotracheal reflux  . Prediabetes   . Family history of adverse reaction to anesthesia     mother nausea/vomiting  . Sleep apnea     11% do not use CPAP  . Nasal congestion     nasal dripping  . 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 Anemia 2013    CURRENT MEDICATIONS: Reviewed per MAR/see medication list  No Known Allergies   REVIEW OF SYSTEMS:  GENERAL: no change in appetite, no fatigue, no weight changes, no fever, chills or weakness RESPIRATORY: no cough, SOB, DOE, wheezing, hemoptysis CARDIAC: no chest pain, edema or palpitations GI: no abdominal pain, diarrhea, constipation, heart burn, nausea or vomiting  PHYSICAL EXAMINATION  GENERAL: no acute distress, normal body habitus EYES: conjunctivae normal, sclerae normal, normal eye lids NECK: supple, trachea midline, no neck masses, no thyroid tenderness,  no thyromegaly LYMPHATICS: no LAN in the neck, no supraclavicular LAN RESPIRATORY: breathing is even & unlabored, BS CTAB CARDIAC: RRR, no murmur,no extra heart sounds, no edema GI: abdomen soft, normal BS, no masses, no tenderness, no hepatomegaly, no splenomegaly EXTREMITIES: Able to move 4 extremities PSYCHIATRIC: the patient is alert & oriented to person, affect & behavior appropriate  LABS/RADIOLOGY: Labs reviewed: Basic Metabolic Panel:  Recent Labs  08/29/14 0705 08/30/14 0511 08/31/14 0449  NA 130* 136 139  K 4.2 4.1 3.5  CL 99 104 107  CO2 28 25 24   GLUCOSE 127* 133* 110*  BUN 8 8 13   CREATININE 0.76 0.75 0.66  CALCIUM 8.2* 8.6 8.2*   Liver Function Tests:  Recent Labs  08/20/14 1610  AST 21  ALT 16  ALKPHOS 69  BILITOT 0.4  PROT 7.3  ALBUMIN 3.7   CBC:  Recent Labs  08/20/14 1610  08/29/14 0705 08/30/14 0511 08/31/14 0449  WBC 8.0  < > 10.2 9.3 7.7  NEUTROABS 5.1  --   --   --   --   HGB 13.3  < > 10.4* 9.7* 9.5*  HCT 39.5  < > 31.4* 29.1* 29.0*  MCV 88.8  < > 88.2 88.2 88.7  PLT 290  < > 270 241 257  < > = values in this interval not displayed.  Dg Knee Right Port  08/28/2014   CLINICAL DATA:  Osteoarthritis of right knee.  EXAM: PORTABLE RIGHT  KNEE - 1-2 VIEW  COMPARISON:  MRI dated 10/03/2008  FINDINGS: The components of the total knee prosthesis appear in good position. Soft tissue drain in place. No fractures.  IMPRESSION: Satisfactory appearance of the right knee after total knee replacement.   Electronically Signed   By: Rozetta Nunnery M.D.   On: 08/28/2014 11:04    ASSESSMENT/PLAN:  DJD S/P right total knee arthroplasty - for rehabilitation; follow-up with Dr. Percell Miller in 2 weeks; continue Lovenox 30 mg subcutaneous every 12 hours 7 days and bilateral TED hose on in a.m. and off at at bedtime for DVT prophylaxis; Percocet 5/325 mg 1-2 tabs by mouth every 4 hours when necessary and Tylenol 650 mg by mouth every 4 hours when necessary for  pain Hypertension - well controlled; continue amlodipine 5 mg by mouth daily and lisinopril/HCTZ 20/12.5 mg by mouth daily Allergic rhinitis - stable; continue Allegra 180 mg by mouth daily and Nasacort 55 g/ACT 2 sprays in both nostrils daily at bedtime Hypothyroidism - continue Synthroid 100 g by mouth daily GERD - stable; continue Prilosec 40 mg by mouth twice a day Anemia, acute blood loss - hemoglobin 9.5; stable    Goals of care:  Short-term rehabilitation    Labs/test ordered:  none   Spent 50 minutes in patient care.    [facility], NP [employer] [phone]

## 2014-09-04 ENCOUNTER — Non-Acute Institutional Stay (SKILLED_NURSING_FACILITY): Payer: Medicare Other | Admitting: Adult Health

## 2014-09-04 DIAGNOSIS — K219 Gastro-esophageal reflux disease without esophagitis: Secondary | ICD-10-CM

## 2014-09-04 DIAGNOSIS — E039 Hypothyroidism, unspecified: Secondary | ICD-10-CM

## 2014-09-04 DIAGNOSIS — M1711 Unilateral primary osteoarthritis, right knee: Secondary | ICD-10-CM

## 2014-09-04 DIAGNOSIS — I10 Essential (primary) hypertension: Secondary | ICD-10-CM

## 2014-09-04 DIAGNOSIS — J309 Allergic rhinitis, unspecified: Secondary | ICD-10-CM

## 2014-09-04 DIAGNOSIS — D62 Acute posthemorrhagic anemia: Secondary | ICD-10-CM

## 2014-09-11 ENCOUNTER — Encounter: Payer: Self-pay | Admitting: Adult Health

## 2014-09-11 NOTE — Progress Notes (Signed)
This encounter was created in error - please disregard.

## 2014-09-12 ENCOUNTER — Encounter: Payer: Self-pay | Admitting: Adult Health

## 2014-09-12 NOTE — Progress Notes (Signed)
Patient ID: Stacey Coleman, female   DOB: 01-Oct-1950, 65 y.o.   MRN: [id number]   09/04/14  Facility:  Nursing Home Location:  [place] Room Number: 092-Z LEVEL OF CARE:  SNF (31)   Chief Complaint  Patient presents with  . Discharge Note    DJD S/P right total knee arthroplasty, hypertension, allergic rhinitis, hypothyroidism, GERD and anemia    HISTORY OF PRESENT ILLNESS:  This is a 64 year old female who is for discharge home and will have outpatient rehabilitation. DME:  Rolling walker. She has been admitted to [facility] on 08/31/14 from [facility]. She has past medical history of osteoporosis, hypothyroidism, chondral sarcoma of left humerus and prediabetes. 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 Patient was admitted to this facility for short-term rehabilitation after the patient's recent hospitalization.  Patient has completed SNF rehabilitation and therapy has cleared the patient for discharge.  PAST MEDICAL HISTORY:  Past Medical History  Diagnosis Date  . Hypertension   . Arthritis   . GERD (gastroesophageal reflux disease)   . Osteoporosis   . Wears partial dentures   . Wears hearing aid     both ears  . Hypothyroidism   . Hypothyroid   . Seasonal allergies   . Laryngotracheal cleft     laryngotracheal reflux  . Prediabetes   . Family history of adverse reaction to anesthesia     mother nausea/vomiting  . Sleep apnea     11% do not use CPAP  . Nasal congestion     nasal dripping  . 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 Anemia 2013    CURRENT MEDICATIONS: Reviewed per MAR/see medication list  No Known Allergies   REVIEW OF SYSTEMS:  GENERAL: no change in appetite, no fatigue, no weight changes, no fever, chills or weakness RESPIRATORY: no cough, SOB, DOE, wheezing, hemoptysis CARDIAC: no chest pain, edema or palpitations GI: no abdominal pain, diarrhea, constipation, heart  burn, nausea or vomiting  PHYSICAL EXAMINATION  GENERAL: no acute distress, normal body habitus NECK: supple, trachea midline, no neck masses, no thyroid tenderness, no thyromegaly LYMPHATICS: no LAN in the neck, no supraclavicular LAN RESPIRATORY: breathing is even & unlabored, BS CTAB CARDIAC: RRR, no murmur,no extra heart sounds, no edema GI: abdomen soft, normal BS, no masses, no tenderness, no hepatomegaly, no splenomegaly EXTREMITIES: Able to move 4 extremities PSYCHIATRIC: the patient is alert & oriented to person, affect & behavior appropriate  LABS/RADIOLOGY: Labs reviewed: Basic Metabolic Panel:  Recent Labs  08/29/14 0705 08/30/14 0511 08/31/14 0449  NA 130* 136 139  K 4.2 4.1 3.5  CL 99 104 107  CO2 28 25 24   GLUCOSE 127* 133* 110*  BUN 8 8 13   CREATININE 0.76 0.75 0.66  CALCIUM 8.2* 8.6 8.2*   Liver Function Tests:  Recent Labs  08/20/14 1610  AST 21  ALT 16  ALKPHOS 69  BILITOT 0.4  PROT 7.3  ALBUMIN 3.7   CBC:  Recent Labs  08/20/14 1610  08/29/14 0705 08/30/14 0511 08/31/14 0449  WBC 8.0  < > 10.2 9.3 7.7  NEUTROABS 5.1  --   --   --   --   HGB 13.3  < > 10.4* 9.7* 9.5*  HCT 39.5  < > 31.4* 29.1* 29.0*  MCV 88.8  < > 88.2 88.2 88.7  PLT 290  < > 270 241 257  < > = values  in this interval not displayed.  Dg Knee Right Port  08/28/2014   CLINICAL DATA:  Osteoarthritis of right knee.  EXAM: PORTABLE RIGHT KNEE - 1-2 VIEW  COMPARISON:  MRI dated 10/03/2008  FINDINGS: The components of the total knee prosthesis appear in good position. Soft tissue drain in place. No fractures.  IMPRESSION: Satisfactory appearance of the right knee after total knee replacement.   Electronically Signed   By: Rozetta Nunnery M.D.   On: 08/28/2014 11:04    ASSESSMENT/PLAN:  DJD S/P right total knee arthroplasty - for outpatient rehabilitation; follow-up with Dr. Percell Miller, ortho surgeon ; continue bilateral TED hose on in a.m. and off at at bedtime for DVT  prophylaxis; Percocet 5/325 mg 1-2 tabs by mouth every 4 hours when necessary and Tylenol 650 mg by mouth every 4 hours when necessary for pain Hypertension - well controlled; continue amlodipine 5 mg by mouth daily and lisinopril/HCTZ 20/12.5 mg by mouth daily Allergic rhinitis - stable; continue Allegra 180 mg by mouth daily and Nasacort 55 g/ACT 2 sprays in both nostrils daily at bedtime Hypothyroidism - continue Synthroid 100 g by mouth daily GERD - stable; continue Prilosec 40 mg by mouth twice a day Anemia, acute blood loss - hemoglobin 9.5; stable    I have filled out patient's discharge paperwork and written prescriptions.  Patient will have outpatient rehabilitation.  DME provided:  Rolling walker  Total discharge time: Greater than 30 minutes  Discharge time involved coordination of the discharge process with Education officer, museum, nursing staff and therapy department. Medical justification for DME verified.  [facility], NP [employer] [phone]

## 2015-01-20 ENCOUNTER — Other Ambulatory Visit: Payer: Self-pay

## 2017-01-19 ENCOUNTER — Ambulatory Visit: Payer: Self-pay

## 2017-01-19 ENCOUNTER — Ambulatory Visit (INDEPENDENT_AMBULATORY_CARE_PROVIDER_SITE_OTHER): Payer: Medicare Other | Admitting: Student

## 2017-01-19 ENCOUNTER — Encounter: Payer: Self-pay | Admitting: Student

## 2017-01-19 VITALS — BP 130/80 | Ht 60.0 in | Wt 147.0 lb

## 2017-01-19 DIAGNOSIS — M25551 Pain in right hip: Secondary | ICD-10-CM

## 2017-01-19 DIAGNOSIS — M7071 Other bursitis of hip, right hip: Secondary | ICD-10-CM

## 2017-01-19 MED ORDER — METHYLPREDNISOLONE ACETATE 40 MG/ML IJ SUSP
40.0000 mg | Freq: Once | INTRAMUSCULAR | Status: AC
Start: 2017-01-19 — End: 2017-01-19
  Administered 2017-01-19: 40 mg via INTRA_ARTICULAR

## 2017-01-19 NOTE — Progress Notes (Signed)
  Stacey Coleman - 66 y.o. female MRN [id number]  Date of birth: April 08, 1951  SUBJECTIVE:  Including CC & ROS.  CC: right ischial tuberosity bursitis  Presents with right gluteal pain that has been ongoing for months. She states that 3 months ago she had fallen while at the gym and hit her hip against an elliptical or bike machine. She reports that since that time her right gluteal area has been aggravated. She denies any pain down her posterior leg. Denies any groin pain or lateral hip pain. She denies any weakness. She has been seen for this and given exercises and going to physical therapy. She takes ibuprofen for pain and it does help somewhat. She has been trying to massage it without relief. She does report that it is a lot better but she is still having significant pain and she is going on a trip to [place] next week.   ROS: No unexpected weight loss, fever, chills, swelling, instability, muscle pain, numbness/tingling, redness, otherwise see HPI   PMHx - Updated and reviewed.  Contributory factors include: HTN, allergies, GERD, hypothyroid, HLD, preDM PSHx - Updated and reviewed.  Contributory factors include:  Negative FHx - Updated and reviewed.  Contributory factors include:  Negative Social Hx - Updated and reviewed. Contributory factors include: Negative Medications - reviewed   DATA REVIEWED: none  PHYSICAL EXAM:  VS: BP:130/80  HR: bpm  TEMP: ( )  RESP:   HT:5' (152.4 cm)   WT:147 lb (66.7 kg)  BMI:28.8 PHYSICAL EXAM: Gen: NAD, alert, cooperative with exam, well-appearing HEENT: clear conjunctiva,  CV:  no edema, capillary refill brisk, normal rate Resp: non-labored Skin: no rashes, normal turgor  Neuro: no gross deficits.  Psych:  alert and oriented  Hip: Tenderness to palpation at right ischial tuberosity ROM IR: 45 Deg, ER: 45 Deg, Flexion: 120 Deg, Extension: 100 Deg, Abduction: 45 Deg, Adduction: 45 Deg Strength IR: 5/5, ER: 5/5, Flexion: 5/5, Extension: 5/5,  Abduction: 5/5, Adduction: 5/5 Pelvic alignment unremarkable to inspection and palpation. Standing hip rotation and gait without trendelenburg sign / unsteadiness. Greater trochanter without tenderness to palpation. No tenderness over piriformis. No pain with FABER or FADIR. No SI joint tenderness and normal minimal SI movement.  Limited ultrasound of right pelvis:  Ischial tuberosity seen with inflammation around the bursa. Hamstring tendons intact  Summary: Findings consistent with ischial tuberosity bursitis  Ultrasound performed and interpreted by Melton Krebs, DO   ASSESSMENT & PLAN:   Ischial bursitis, right Injection performed to this area. Ultrasound confirmed bursal swelling. Patient tolerated procedure well. She'll continue Askling's exercises. Recommended ice for inflammation and she can continue over-the-counter medications for pain.  Procedure:  Injection of right ischial bursa Consent obtained and verified. Time-out conducted. Noted no overlying erythema, induration, or other signs of local infection. Skin prepped in a sterile fashion. Topical analgesic spray: Ethyl chloride. Completed without difficulty. Meds: 40 mg Depo-Medrol and 3 mL 1% lidocaine Pain immediately improved suggesting accurate placement of the medication. Advised to call if fevers/chills, erythema, induration, drainage, or persistent bleeding.

## 2017-01-19 NOTE — Assessment & Plan Note (Signed)
Injection performed to this area. Ultrasound confirmed bursal swelling. Patient tolerated procedure well. She'll continue Askling's exercises. Recommended ice for inflammation and she can continue over-the-counter medications for pain.

## 2017-09-13 ENCOUNTER — Telehealth: Payer: Self-pay

## 2017-09-13 NOTE — Telephone Encounter (Signed)
ERROR

## 2017-09-13 NOTE — Telephone Encounter (Signed)
SENT REFERRAL TO SCHEDULING 

## 2017-09-21 ENCOUNTER — Other Ambulatory Visit: Payer: Self-pay | Admitting: *Deleted

## 2017-09-22 ENCOUNTER — Encounter: Payer: Self-pay | Admitting: Cardiology

## 2017-09-22 ENCOUNTER — Ambulatory Visit: Payer: Medicare Other | Admitting: Cardiology

## 2017-09-22 VITALS — BP 120/80 | HR 90 | Ht 60.0 in | Wt 156.0 lb

## 2017-09-22 DIAGNOSIS — R079 Chest pain, unspecified: Secondary | ICD-10-CM

## 2017-09-22 DIAGNOSIS — Z8249 Family history of ischemic heart disease and other diseases of the circulatory system: Secondary | ICD-10-CM

## 2017-09-22 DIAGNOSIS — E785 Hyperlipidemia, unspecified: Secondary | ICD-10-CM

## 2017-09-22 DIAGNOSIS — I10 Essential (primary) hypertension: Secondary | ICD-10-CM

## 2017-09-22 MED ORDER — METOPROLOL TARTRATE 50 MG PO TABS: 50.0000 mg | ORAL_TABLET | Freq: Once | ORAL | 0 refills | Status: DC

## 2017-09-22 NOTE — Patient Instructions (Signed)
Medication Instructions:   Your physician recommends that you continue on your current medications as directed. Please refer to the Current Medication list given to you today.    Testing/Procedures:    CORONARY CT Please arrive at the [facility] main entrance of [facility] at xx:xx AM (30-45 minutes prior to test start time)  [facility] [address] [place], [place] [address] [phone]  Proceed to the [facility] Radiology Department (First Floor).  Please follow these instructions carefully (unless otherwise directed):    On the Night Before the Test: . Drink plenty of water. . Do not consume any caffeinated/decaffeinated beverages or chocolate 12 hours prior to your test. . Do not take any antihistamines 12 hours prior to your test.   On the Day of the Test: . Drink plenty of water. Do not drink any water within one hour of the test. . Do not eat any food 4 hours prior to the test. . You may take your regular medications prior to the test. . IF NOT ON A BETA BLOCKER - Take 50 mg of lopressor (metoprolol) one hour before the test.   After the Test: . Drink plenty of water. . After receiving IV contrast, you may experience a mild flushed feeling. This is normal. . On occasion, you may experience a mild rash up to 24 hours after the test. This is not dangerous. If this occurs, you can take Benadryl 25 mg and increase your fluid intake. . If you experience trouble breathing, this can be serious. If it is severe call 911 IMMEDIATELY. If it is mild, please call our office.    Follow-Up:  DR NELSON'S NEXT AVAILABLE APPOINTMENT       If you need a refill on your cardiac medications before your next appointment, please call your pharmacy.

## 2017-09-22 NOTE — Progress Notes (Signed)
Cardiology Office Note:    Date:  09/24/2017   ID:  Stacey Coleman, DOB June 19, 1951, MRN [id number]  PCP:  Shirline Frees, MD  Cardiologist:  Ena Dawley  Referring MD: Shirline Frees, MD   Chief Complaint  Patient presents with  . Chest Pain    History of Present Illness:    Stacey Coleman is a 66 y.o. female with a hx of hypertension, hypothyrodism, hyperlipidemia and FH of early CAD - in her father and GF. She has experienced an episode of  chest pain at rest that lasted about 30 seconds and radiated to her left arm and jaw, not associated with SOB, but she felt tired afterwards. 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 She has been treated for hypertension but not for hyperlipidemia, she has been using OTC flax seeds and fish oil. She denies any dizziness or syncope, no claudications, LE edema, no orthopnea.  TSH 1.09 HDL 60 TG 71 LDL 128  Past Medical History:  Diagnosis Date  . Anemia 2013  . Arthritis   . Cancer [facility]) 2004   chondrosarcoma left humerus  . Family history of adverse reaction to anesthesia    mother nausea/vomiting  . GERD (gastroesophageal reflux disease)   . Hypertension   . Hypothyroid   . Hypothyroidism   . Laryngotracheal cleft    laryngotracheal reflux  . Nasal congestion    nasal dripping  . Osteoporosis   . Prediabetes   . Seasonal allergies   . Sleep apnea    11% do not use CPAP  . Wears hearing aid    both ears  . Wears partial dentures     Past Surgical History:  Procedure Laterality Date  . ANKLE ARTHROSCOPY  2008   spurs-left  . BONE RESECTION  2004   left humerous  . COLONOSCOPY    . KNEE ARTHROSCOPY  1990   rt  . KNEE ARTHROSCOPY  2002   rt  . KNEE ARTHROSCOPY W/ ACL RECONSTRUCTION  2001   right  . KNEE ARTHROSCOPY WITH MEDIAL MENISECTOMY Left 12/13/2013   Procedure: LEFT KNEE ARTHROSCOPY WITH MEDIAL MENISECTOMY, PLICA EXCISION, CHONDROPLASTY;  Surgeon: Ninetta Lights, MD;  Location: [place];  Service: Orthopedics;  Laterality: Left;  . SHOULDER SURGERY Left 2004   Removed chondro sarcoma  . TOTAL KNEE ARTHROPLASTY Right 08/28/2014   Procedure: RIGHT TOTAL KNEE ARTHROPLASTY;  Surgeon: Ninetta Lights, MD;  Location: [place];  Service: Orthopedics;  Laterality: Right;    Current Medications: Current Meds  Medication Sig  . amLODipine (NORVASC) 5 MG tablet Take 5 mg by mouth daily.  . benazepril-hydrochlorthiazide (LOTENSIN HCT) 20-12.5 MG per tablet Take 1 tablet by mouth daily.  . calcium carbonate (TUMS) 500 MG chewable tablet Chew by mouth.  . Cholecalciferol (VITAMIN D3) 2000 units capsule Take by mouth daily.  . Cinnamon 500 MG TABS Take 1,000 mg by mouth 2 (two) times daily at 10 AM and 5 PM.  . Coenzyme Q10 (CO Q 10) 10 MG CAPS Take 10 mg by mouth 2 (two) times daily.  . DOCOSAHEXAENOIC ACID PO Take 1 g by mouth.  . fexofenadine (ALLEGRA) 180 MG tablet Take 180 mg by mouth daily.  . Flaxseed, Linseed, (FLAX SEEDS PO) Take by mouth daily.  . Lactobacillus (PROBIOTIC ACIDOPHILUS PO) Take by mouth daily.  Marland Kitchen levothyroxine (SYNTHROID, LEVOTHROID) 125 MCG tablet Take 125 mcg by mouth daily before breakfast.  .  meloxicam (MOBIC) 15 MG tablet Take by mouth.  . mometasone (NASONEX) 50 MCG/ACT nasal spray Nasonex 50 mcg/actuation Spray  Spray 2 sprays every day by intranasal route.  Marland Kitchen omeprazole (PRILOSEC) 40 MG capsule Take 40 mg by mouth 2 (two) times daily.   . raloxifene (EVISTA) 60 MG tablet Take by mouth.  . triamcinolone (NASACORT) 55 MCG/ACT AERO nasal inhaler Place 2 sprays into both nostrils at bedtime.     Allergies:   Patient has no known allergies.   Social History   Socioeconomic History  . Marital status: Widowed    Spouse name: None  . Number of children: None  . Years of education: None  . Highest education level: None  Social Needs  . Financial resource strain: None  . Food insecurity - worry: None  .  Food insecurity - inability: None  . Transportation needs - medical: None  . Transportation needs - non-medical: None  Occupational History  . Occupation: retired Pharmacist, hospital  Tobacco Use  . Smoking status: Never Smoker  . Smokeless tobacco: Never Used  Substance and Sexual Activity  . Alcohol use: Yes    Alcohol/week: 0.0 oz    Comment: occ  . Drug use: No  . Sexual activity: None  Other Topics Concern  . 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     Family History: The patient's family history includes Breast cancer in her mother; Colon cancer in her paternal grandfather; Diabetes in her brother and unknown relative; Heart attack in her father and maternal grandfather; Hypertension in her father.  ROS:   Please see the history of present illness.    All other systems reviewed and are negative.  EKGs/Labs/Other Studies Reviewed:    The following studies were reviewed today:  EKG:  EKG from her PCP from 09/12/2017 shows NSR, normal ECG  Recent Labs: No results found for requested labs within last 8760 hours.  Recent Lipid Panel No results found for: CHOL, TRIG, HDL, CHOLHDL, VLDL, LDLCALC, LDLDIRECT  Physical Exam:    VS:  BP 120/80 (BP Location: Right Arm, Patient Position: Sitting, Cuff Size: Normal)   Pulse 90   Ht 5' (1.524 m)   Wt 156 lb (70.8 kg)   SpO2 98%   BMI 30.47 kg/m     Wt Readings from Last 3 Encounters:  09/22/17 156 lb (70.8 kg)  01/19/17 147 lb (66.7 kg)  09/04/14 157 lb 3.2 oz (71.3 kg)     GEN: well nourished, well developed in no acute distress HEENT: Normal NECK: No JVD; No carotid bruits LYMPHATICS: No lymphadenopathy CARDIAC: RRR, no murmurs, rubs, gallops RESPIRATORY:  Clear to auscultation without rales, wheezing or rhonchi  ABDOMEN: Soft, non-tender, non-distended MUSCULOSKELETAL:  No edema; No deformity  SKIN: Warm and dry NEUROLOGIC:  Alert and oriented x 3 PSYCHIATRIC:  Normal affect   ASSESSMENT:      1. Essential hypertension   2. Hyperlipidemia, unspecified hyperlipidemia type   3. Chest pain, unspecified type    PLAN:    In order of problems listed above:  1. Chest pain - sounds atypical, however risk factors and significant FH of early CAD and untreated HLP. We will obtain calcium score and coronary CTA to further evaluate. 2. Hypertension - well controlled 3. HLP - LDL 128 - treatment depending on CT results.   Medication Adjustments/Labs and Tests Ordered: Current medicines are reviewed at length with the patient today.  Concerns regarding medicines are outlined above.  Orders  Placed This Encounter  Procedures  . CT CORONARY MORPH W/CTA COR W/SCORE W/CA W/CM &/OR WO/CM  . CT CORONARY FRACTIONAL FLOW RESERVE DATA PREP  . CT CORONARY FRACTIONAL FLOW RESERVE FLUID ANALYSIS   Meds ordered this encounter  Medications  . metoprolol tartrate (LOPRESSOR) 50 MG tablet    Sig: Take 1 tablet (50 mg total) by mouth once for 1 dose. Take 1 hour prior to Coronary CT.    Dispense:  1 tablet    Refill:  0    Signed, Ena Dawley, MD  09/24/2017 6:29 PM    [place]

## 2017-10-18 ENCOUNTER — Telehealth: Payer: Self-pay | Admitting: *Deleted

## 2017-10-18 DIAGNOSIS — E785 Hyperlipidemia, unspecified: Secondary | ICD-10-CM

## 2017-10-18 DIAGNOSIS — R7303 Prediabetes: Secondary | ICD-10-CM

## 2017-10-18 DIAGNOSIS — I10 Essential (primary) hypertension: Secondary | ICD-10-CM

## 2017-10-18 NOTE — Telephone Encounter (Signed)
BMET placed on this pt for 10/31/17, per coronary CT protocol.  Coronary CT scheduled for 11/02/17.

## 2017-10-31 ENCOUNTER — Other Ambulatory Visit: Payer: Medicare Other | Admitting: *Deleted

## 2017-10-31 ENCOUNTER — Telehealth: Payer: Self-pay

## 2017-10-31 DIAGNOSIS — E559 Vitamin D deficiency, unspecified: Secondary | ICD-10-CM

## 2017-10-31 DIAGNOSIS — R7303 Prediabetes: Secondary | ICD-10-CM

## 2017-10-31 DIAGNOSIS — E785 Hyperlipidemia, unspecified: Secondary | ICD-10-CM

## 2017-10-31 DIAGNOSIS — I10 Essential (primary) hypertension: Secondary | ICD-10-CM

## 2017-10-31 NOTE — Telephone Encounter (Signed)
Patient in today for BMET and brought Rx for serum B12 from [facility]. Per Dr. Meda Coffee, [place] to order.

## 2017-11-01 LAB — BASIC METABOLIC PANEL
BUN/Creatinine Ratio: 19 (ref 12–28)
BUN: 15 mg/dL (ref 8–27)
CO2: 24 mmol/L (ref 20–29)
Calcium: 10 mg/dL (ref 8.7–10.3)
Chloride: 100 mmol/L (ref 96–106)
Creatinine, Ser: 0.77 mg/dL (ref 0.57–1.00)
GFR calc Af Amer: 92 mL/min/{1.73_m2} (ref >59)
GFR calc non Af Amer: 80 mL/min/{1.73_m2} (ref >59)
Glucose: 105 mg/dL — ABNORMAL HIGH (ref 65–99)
Potassium: 4 mmol/L (ref 3.5–5.2)
Sodium: 138 mmol/L (ref 134–144)

## 2017-11-01 LAB — VITAMIN B12: Vitamin B-12: 782 pg/mL (ref 232–1245)

## 2017-11-02 ENCOUNTER — Ambulatory Visit (HOSPITAL_COMMUNITY): Admission: RE | Admit: 2017-11-02 | Payer: Medicare Other | Source: Ambulatory Visit

## 2017-11-02 ENCOUNTER — Ambulatory Visit (HOSPITAL_COMMUNITY): Payer: Medicare Other

## 2017-11-04 ENCOUNTER — Telehealth: Payer: Self-pay | Admitting: Cardiology

## 2017-11-04 NOTE — Telephone Encounter (Signed)
Informed the pt that the RN whom received her RX to check an outside Providers lab request, was noted to be written as an  RX for her to have her B-12 Level checked, and that would be why there was not a Vit D level checked.  Please refer below for this notation.  Theodoro Parma, RN       10/31/17 4:06 PM  Note    Patient in today for BMET and brought Rx for serum B12 from [facility]. Per Dr. Meda Coffee, [place] to order.      Apologies provided to the pt. Pt verbalized understanding.

## 2017-11-04 NOTE — Telephone Encounter (Signed)
New message:    Pt states she received all of her lab results in her my chart but has not received results from the Vitamin D that was requested by another Dr.

## 2017-11-07 ENCOUNTER — Ambulatory Visit (HOSPITAL_COMMUNITY)
Admission: RE | Admit: 2017-11-07 | Discharge: 2017-11-07 | Disposition: A | Discharge status: Home / Self Care | Payer: Medicare Other | Source: Ambulatory Visit | Attending: Cardiology | Admitting: Cardiology

## 2017-11-07 DIAGNOSIS — R079 Chest pain, unspecified: Secondary | ICD-10-CM | POA: Diagnosis present

## 2017-11-07 DIAGNOSIS — I10 Essential (primary) hypertension: Secondary | ICD-10-CM | POA: Diagnosis present

## 2017-11-07 DIAGNOSIS — R0789 Other chest pain: Secondary | ICD-10-CM | POA: Diagnosis not present

## 2017-11-07 DIAGNOSIS — E785 Hyperlipidemia, unspecified: Secondary | ICD-10-CM | POA: Insufficient documentation

## 2017-11-07 DIAGNOSIS — I2510 Atherosclerotic heart disease of native coronary artery without angina pectoris: Secondary | ICD-10-CM | POA: Diagnosis not present

## 2017-11-07 MED ORDER — NITROGLYCERIN 0.4 MG SL SUBL
SUBLINGUAL_TABLET | SUBLINGUAL | Status: AC
  Filled 2017-11-07: qty 2

## 2017-11-07 MED ORDER — NITROGLYCERIN 0.4 MG SL SUBL
0.8000 mg | SUBLINGUAL_TABLET | Freq: Once | SUBLINGUAL | Status: AC
  Administered 2017-11-07: 0.8 mg via SUBLINGUAL

## 2017-11-07 MED ORDER — METOPROLOL TARTRATE 5 MG/5ML IV SOLN
INTRAVENOUS | Status: AC
  Filled 2017-11-07: qty 10

## 2017-11-07 MED ORDER — METOPROLOL TARTRATE 5 MG/5ML IV SOLN
5.0000 mg | INTRAVENOUS | Status: DC | PRN
  Administered 2017-11-07 (×2): 5 mg via INTRAVENOUS

## 2017-11-07 MED ORDER — IOPAMIDOL (ISOVUE-370) INJECTION 76%
INTRAVENOUS | Status: AC
  Filled 2017-11-07: qty 100

## 2017-11-07 MED ORDER — IOPAMIDOL (ISOVUE-370) INJECTION 76%
100.0000 mL | Freq: Once | INTRAVENOUS | Status: AC | PRN
  Administered 2017-11-07: 100 mL via INTRAVENOUS

## 2017-11-14 ENCOUNTER — Telehealth: Payer: Self-pay | Admitting: Cardiology

## 2017-11-14 NOTE — Telephone Encounter (Signed)
Patient called in requesting clarification on her diagnosis of aortic atherosclerosis that was noted her her CT scan. I informed patient that per coronary CT report she has a calcium score of 0 but there was minimal plaque in the RCA and LAD but otherwise normal coronary arteries. She verbalized understanding and thankful for the call.

## 2017-11-14 NOTE — Telephone Encounter (Signed)
New message:     Pt is calling due to some my chart notes she saw and would like to discuss it with someone

## 2019-09-14 ENCOUNTER — Ambulatory Visit: Payer: Medicare Other

## 2019-11-13 DIAGNOSIS — H04123 Dry eye syndrome of bilateral lacrimal glands: Secondary | ICD-10-CM | POA: Diagnosis not present

## 2019-11-26 DIAGNOSIS — Z01419 Encounter for gynecological examination (general) (routine) without abnormal findings: Secondary | ICD-10-CM | POA: Diagnosis not present

## 2019-11-26 DIAGNOSIS — Z6828 Body mass index (BMI) 28.0-28.9, adult: Secondary | ICD-10-CM | POA: Diagnosis not present

## 2019-11-26 DIAGNOSIS — Z124 Encounter for screening for malignant neoplasm of cervix: Secondary | ICD-10-CM | POA: Diagnosis not present

## 2019-12-05 DIAGNOSIS — M62542 Muscle wasting and atrophy, not elsewhere classified, left hand: Secondary | ICD-10-CM | POA: Diagnosis not present

## 2019-12-05 DIAGNOSIS — M779 Enthesopathy, unspecified: Secondary | ICD-10-CM | POA: Diagnosis not present

## 2019-12-05 DIAGNOSIS — M180 Bilateral primary osteoarthritis of first carpometacarpal joints: Secondary | ICD-10-CM | POA: Diagnosis not present

## 2019-12-25 DIAGNOSIS — M47816 Spondylosis without myelopathy or radiculopathy, lumbar region: Secondary | ICD-10-CM | POA: Diagnosis not present

## 2019-12-25 DIAGNOSIS — M160 Bilateral primary osteoarthritis of hip: Secondary | ICD-10-CM | POA: Diagnosis not present

## 2019-12-25 DIAGNOSIS — M545 Low back pain: Secondary | ICD-10-CM | POA: Diagnosis not present

## 2020-02-13 DIAGNOSIS — M62542 Muscle wasting and atrophy, not elsewhere classified, left hand: Secondary | ICD-10-CM | POA: Diagnosis not present

## 2020-02-13 DIAGNOSIS — M25532 Pain in left wrist: Secondary | ICD-10-CM | POA: Diagnosis not present

## 2020-03-14 DIAGNOSIS — E7800 Pure hypercholesterolemia, unspecified: Secondary | ICD-10-CM | POA: Diagnosis not present

## 2020-03-14 DIAGNOSIS — M545 Low back pain: Secondary | ICD-10-CM | POA: Diagnosis not present

## 2020-03-14 DIAGNOSIS — R7303 Prediabetes: Secondary | ICD-10-CM | POA: Diagnosis not present

## 2020-03-14 DIAGNOSIS — G4700 Insomnia, unspecified: Secondary | ICD-10-CM | POA: Diagnosis not present

## 2020-03-14 DIAGNOSIS — I10 Essential (primary) hypertension: Secondary | ICD-10-CM | POA: Diagnosis not present

## 2020-03-14 DIAGNOSIS — Z7189 Other specified counseling: Secondary | ICD-10-CM | POA: Diagnosis not present

## 2020-03-14 DIAGNOSIS — J387 Other diseases of larynx: Secondary | ICD-10-CM | POA: Diagnosis not present

## 2020-03-14 DIAGNOSIS — M85851 Other specified disorders of bone density and structure, right thigh: Secondary | ICD-10-CM | POA: Diagnosis not present

## 2020-03-14 DIAGNOSIS — E039 Hypothyroidism, unspecified: Secondary | ICD-10-CM | POA: Diagnosis not present

## 2020-04-25 DIAGNOSIS — M62542 Muscle wasting and atrophy, not elsewhere classified, left hand: Secondary | ICD-10-CM | POA: Diagnosis not present

## 2020-04-25 DIAGNOSIS — M5030 Other cervical disc degeneration, unspecified cervical region: Secondary | ICD-10-CM | POA: Insufficient documentation

## 2020-06-05 DIAGNOSIS — H524 Presbyopia: Secondary | ICD-10-CM | POA: Diagnosis not present

## 2020-06-05 DIAGNOSIS — H04123 Dry eye syndrome of bilateral lacrimal glands: Secondary | ICD-10-CM | POA: Diagnosis not present

## 2020-06-05 DIAGNOSIS — H35371 Puckering of macula, right eye: Secondary | ICD-10-CM | POA: Diagnosis not present

## 2020-07-01 DIAGNOSIS — R5383 Other fatigue: Secondary | ICD-10-CM | POA: Diagnosis not present

## 2020-07-01 DIAGNOSIS — M2550 Pain in unspecified joint: Secondary | ICD-10-CM | POA: Diagnosis not present

## 2020-07-01 DIAGNOSIS — M25561 Pain in right knee: Secondary | ICD-10-CM | POA: Diagnosis not present

## 2020-07-01 DIAGNOSIS — E559 Vitamin D deficiency, unspecified: Secondary | ICD-10-CM | POA: Diagnosis not present

## 2020-07-01 DIAGNOSIS — M19079 Primary osteoarthritis, unspecified ankle and foot: Secondary | ICD-10-CM | POA: Diagnosis not present

## 2020-07-01 DIAGNOSIS — M19072 Primary osteoarthritis, left ankle and foot: Secondary | ICD-10-CM | POA: Diagnosis not present

## 2020-07-03 DIAGNOSIS — M19072 Primary osteoarthritis, left ankle and foot: Secondary | ICD-10-CM | POA: Diagnosis not present

## 2020-07-09 DIAGNOSIS — M722 Plantar fascial fibromatosis: Secondary | ICD-10-CM | POA: Diagnosis not present

## 2020-07-09 DIAGNOSIS — M19072 Primary osteoarthritis, left ankle and foot: Secondary | ICD-10-CM | POA: Diagnosis not present

## 2020-07-21 DIAGNOSIS — Z111 Encounter for screening for respiratory tuberculosis: Secondary | ICD-10-CM | POA: Diagnosis not present

## 2020-08-18 DIAGNOSIS — H35373 Puckering of macula, bilateral: Secondary | ICD-10-CM | POA: Diagnosis not present

## 2020-08-18 DIAGNOSIS — H43812 Vitreous degeneration, left eye: Secondary | ICD-10-CM | POA: Diagnosis not present

## 2020-08-18 DIAGNOSIS — H35362 Drusen (degenerative) of macula, left eye: Secondary | ICD-10-CM | POA: Diagnosis not present

## 2020-08-18 DIAGNOSIS — H442E3 Degenerative myopia with other maculopathy, bilateral eye: Secondary | ICD-10-CM | POA: Diagnosis not present

## 2020-09-15 DIAGNOSIS — Z1231 Encounter for screening mammogram for malignant neoplasm of breast: Secondary | ICD-10-CM | POA: Diagnosis not present

## 2020-09-29 DIAGNOSIS — E7800 Pure hypercholesterolemia, unspecified: Secondary | ICD-10-CM | POA: Diagnosis not present

## 2020-09-29 DIAGNOSIS — R1013 Epigastric pain: Secondary | ICD-10-CM | POA: Diagnosis not present

## 2020-09-29 DIAGNOSIS — E039 Hypothyroidism, unspecified: Secondary | ICD-10-CM | POA: Diagnosis not present

## 2020-09-29 DIAGNOSIS — I10 Essential (primary) hypertension: Secondary | ICD-10-CM | POA: Diagnosis not present

## 2020-09-29 DIAGNOSIS — Z0000 Encounter for general adult medical examination without abnormal findings: Secondary | ICD-10-CM | POA: Diagnosis not present

## 2020-09-29 DIAGNOSIS — Z1389 Encounter for screening for other disorder: Secondary | ICD-10-CM | POA: Diagnosis not present

## 2020-09-29 DIAGNOSIS — J387 Other diseases of larynx: Secondary | ICD-10-CM | POA: Diagnosis not present

## 2020-09-29 DIAGNOSIS — G4700 Insomnia, unspecified: Secondary | ICD-10-CM | POA: Diagnosis not present

## 2020-09-29 DIAGNOSIS — R7309 Other abnormal glucose: Secondary | ICD-10-CM | POA: Diagnosis not present

## 2020-10-23 ENCOUNTER — Emergency Department (HOSPITAL_COMMUNITY): Payer: Medicare PPO

## 2020-10-23 ENCOUNTER — Emergency Department (HOSPITAL_COMMUNITY)
Admission: EM | Admit: 2020-10-23 | Discharge: 2020-10-24 | Disposition: A | Discharge status: Home / Self Care | Payer: Medicare PPO | Attending: Emergency Medicine | Admitting: Emergency Medicine

## 2020-10-23 ENCOUNTER — Other Ambulatory Visit: Payer: Self-pay

## 2020-10-23 ENCOUNTER — Encounter (HOSPITAL_COMMUNITY): Payer: Self-pay | Admitting: Emergency Medicine

## 2020-10-23 DIAGNOSIS — S0993XA Unspecified injury of face, initial encounter: Secondary | ICD-10-CM | POA: Diagnosis not present

## 2020-10-23 DIAGNOSIS — M79641 Pain in right hand: Secondary | ICD-10-CM | POA: Diagnosis not present

## 2020-10-23 DIAGNOSIS — W19XXXA Unspecified fall, initial encounter: Secondary | ICD-10-CM | POA: Insufficient documentation

## 2020-10-23 DIAGNOSIS — Y93K1 Activity, walking an animal: Secondary | ICD-10-CM | POA: Insufficient documentation

## 2020-10-23 DIAGNOSIS — M25511 Pain in right shoulder: Secondary | ICD-10-CM | POA: Diagnosis not present

## 2020-10-23 DIAGNOSIS — W1830XA Fall on same level, unspecified, initial encounter: Secondary | ICD-10-CM | POA: Diagnosis not present

## 2020-10-23 DIAGNOSIS — Z79899 Other long term (current) drug therapy: Secondary | ICD-10-CM | POA: Diagnosis not present

## 2020-10-23 DIAGNOSIS — I10 Essential (primary) hypertension: Secondary | ICD-10-CM | POA: Insufficient documentation

## 2020-10-23 DIAGNOSIS — S62616A Displaced fracture of proximal phalanx of right little finger, initial encounter for closed fracture: Secondary | ICD-10-CM | POA: Diagnosis not present

## 2020-10-23 DIAGNOSIS — Z96651 Presence of right artificial knee joint: Secondary | ICD-10-CM | POA: Insufficient documentation

## 2020-10-23 DIAGNOSIS — Z85830 Personal history of malignant neoplasm of bone: Secondary | ICD-10-CM | POA: Insufficient documentation

## 2020-10-23 DIAGNOSIS — S0990XA Unspecified injury of head, initial encounter: Secondary | ICD-10-CM | POA: Insufficient documentation

## 2020-10-23 DIAGNOSIS — M25542 Pain in joints of left hand: Secondary | ICD-10-CM | POA: Diagnosis not present

## 2020-10-23 DIAGNOSIS — S6991XA Unspecified injury of right wrist, hand and finger(s), initial encounter: Secondary | ICD-10-CM | POA: Diagnosis present

## 2020-10-23 DIAGNOSIS — M19042 Primary osteoarthritis, left hand: Secondary | ICD-10-CM | POA: Diagnosis not present

## 2020-10-23 DIAGNOSIS — S1091XA Abrasion of unspecified part of neck, initial encounter: Secondary | ICD-10-CM | POA: Diagnosis not present

## 2020-10-23 DIAGNOSIS — M19041 Primary osteoarthritis, right hand: Secondary | ICD-10-CM | POA: Diagnosis not present

## 2020-10-23 DIAGNOSIS — S60511A Abrasion of right hand, initial encounter: Secondary | ICD-10-CM | POA: Diagnosis not present

## 2020-10-23 DIAGNOSIS — M79642 Pain in left hand: Secondary | ICD-10-CM | POA: Diagnosis not present

## 2020-10-23 DIAGNOSIS — S0081XA Abrasion of other part of head, initial encounter: Secondary | ICD-10-CM | POA: Insufficient documentation

## 2020-10-23 DIAGNOSIS — S0031XA Abrasion of nose, initial encounter: Secondary | ICD-10-CM | POA: Diagnosis not present

## 2020-10-23 DIAGNOSIS — E039 Hypothyroidism, unspecified: Secondary | ICD-10-CM | POA: Insufficient documentation

## 2020-10-23 DIAGNOSIS — M7989 Other specified soft tissue disorders: Secondary | ICD-10-CM | POA: Diagnosis not present

## 2020-10-23 DIAGNOSIS — S62619A Displaced fracture of proximal phalanx of unspecified finger, initial encounter for closed fracture: Secondary | ICD-10-CM | POA: Diagnosis not present

## 2020-10-23 NOTE — ED Triage Notes (Signed)
Pt sent from [facility] in [place], pt had a mechanical fall while walking her dog. Swelling and deformity to right hand, swelling and bruising to bridge of her nose. Per pt, was told her had a displaced fx to right 5th finger, and sent for further evaluation.

## 2020-10-24 ENCOUNTER — Telehealth: Payer: Self-pay | Admitting: *Deleted

## 2020-10-24 ENCOUNTER — Emergency Department (HOSPITAL_COMMUNITY): Payer: Medicare PPO

## 2020-10-24 DIAGNOSIS — M7989 Other specified soft tissue disorders: Secondary | ICD-10-CM | POA: Diagnosis not present

## 2020-10-24 DIAGNOSIS — S62616A Displaced fracture of proximal phalanx of right little finger, initial encounter for closed fracture: Secondary | ICD-10-CM | POA: Diagnosis not present

## 2020-10-24 MED ORDER — OXYCODONE-ACETAMINOPHEN 5-325 MG PO TABS
1.0000 | ORAL_TABLET | Freq: Once | ORAL | Status: AC
  Administered 2020-10-24: 1 via ORAL
  Filled 2020-10-24: qty 1

## 2020-10-24 MED ORDER — TETANUS-DIPHTH-ACELL PERTUSSIS 5-2.5-18.5 LF-MCG/0.5 IM SUSY: 0.5000 mL | PREFILLED_SYRINGE | Freq: Once | INTRAMUSCULAR | Status: DC

## 2020-10-24 MED ORDER — LIDOCAINE HCL (PF) 1 % IJ SOLN
INTRAMUSCULAR | Status: AC
  Administered 2020-10-24: 5 mL
  Filled 2020-10-24: qty 5

## 2020-10-24 MED ORDER — OXYCODONE-ACETAMINOPHEN 5-325 MG PO TABS: 2.0000 | ORAL_TABLET | ORAL | 0 refills | Status: DC | PRN

## 2020-10-24 MED ORDER — LIDOCAINE HCL (PF) 1 % IJ SOLN: 5.0000 mL | Freq: Once | INTRAMUSCULAR | Status: AC

## 2020-10-24 NOTE — ED Notes (Addendum)
Pt provided warm blankets

## 2020-10-24 NOTE — Progress Notes (Signed)
Orthopedic Tech Progress Note Patient Details:  BREEANA SAWTELLE 1950/08/14 [id number]  Ortho Devices Type of Ortho Device: Ulna gutter splint Ortho Device/Splint Location: Right Hand Ortho Device/Splint Interventions: Application   Post Interventions Patient Tolerated: Well Instructions Provided: Care of device   Jayant Kriz E Alexavier Tsutsui 10/24/2020, 1:35 AM

## 2020-10-24 NOTE — ED Notes (Signed)
Ortho called 

## 2020-10-24 NOTE — ED Notes (Signed)
Pt back from x-ray.

## 2020-10-24 NOTE — Telephone Encounter (Signed)
Pharmacy called for ICD code related to pt begin prescribed narcotic.  RNCM reviewed chart to find S62.616A and relayed it to pharmacist.

## 2020-10-24 NOTE — ED Notes (Signed)
Dr Dayna Barker at bedside placing neuro block.

## 2020-10-24 NOTE — ED Notes (Signed)
md at bedside

## 2020-10-25 NOTE — ED Provider Notes (Signed)
[facility] EMERGENCY DEPARTMENT Provider Note   CSN: [id number] Arrival date & time: 10/23/20  2041     History Chief Complaint  Patient presents with  . Fall    Stacey Coleman is a 70 y.o. female.  Patient was walking her dog when it took off and pulled her forward.  She fell under her hands sustaining a couple abrasions to her hands but also for x-raying her right small finger.  She went to urgent care and x-rays done and they said they could not fix it there so sent her to the ER for further evaluation.  Did not hit her head.  No other injuries.   Fall This is a new problem. The current episode started 6 to 12 hours ago. The problem occurs constantly.       Past Medical History:  Diagnosis Date  . Anemia 2013  . Arthritis   . 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 Family history of adverse reaction to anesthesia    mother nausea/vomiting  . GERD (gastroesophageal reflux disease)   . Hypertension   . Hypothyroid   . Hypothyroidism   . Laryngotracheal cleft    laryngotracheal reflux  . Nasal congestion    nasal dripping  . Osteoporosis   . Prediabetes   . Seasonal allergies   . Sleep apnea    11% do not use CPAP  . Wears hearing aid    both ears  . Wears partial dentures     Patient Active Problem List   Diagnosis Date Noted  . Ischial bursitis, right 01/19/2017  . DJD (degenerative joint disease) of knee 08/28/2014  . Essential hypertension 08/15/2014  . Hyperlipidemia 08/15/2014  . Hypothyroidism 08/15/2014  . GERD (gastroesophageal reflux disease) 08/15/2014  . Allergic rhinitis 08/15/2014  . Hypothyroid   . Seasonal allergies   . Prediabetes     Past Surgical History:  Procedure Laterality Date  . ANKLE ARTHROSCOPY  2008   spurs-left  . BONE RESECTION  2004   left humerous  . COLONOSCOPY    . KNEE ARTHROSCOPY  1990   rt  . KNEE ARTHROSCOPY  2002   rt  . KNEE ARTHROSCOPY W/ ACL RECONSTRUCTION  2001   right   . KNEE ARTHROSCOPY WITH MEDIAL MENISECTOMY Left 12/13/2013   Procedure: LEFT KNEE ARTHROSCOPY WITH MEDIAL MENISECTOMY, PLICA EXCISION, CHONDROPLASTY;  Surgeon: Ninetta Lights, MD;  Location: [place];  Service: Orthopedics;  Laterality: Left;  . SHOULDER SURGERY Left 2004   Removed chondro sarcoma  . TOTAL KNEE ARTHROPLASTY Right 08/28/2014   Procedure: RIGHT TOTAL KNEE ARTHROPLASTY;  Surgeon: Ninetta Lights, MD;  Location: [place];  Service: Orthopedics;  Laterality: Right;     OB History   No obstetric history on file.     Family History  Problem Relation Age of Onset  . Breast cancer Mother   . Heart attack Father   . Hypertension Father   . Colon cancer Paternal Grandfather   . Heart attack Maternal Grandfather   . Diabetes Brother   . Diabetes Other     Social History   Tobacco Use  . Smoking status: Never Smoker  . Smokeless tobacco: Never Used  Substance Use Topics  . Alcohol use: Yes    Alcohol/week: 0.0 standard drinks    Comment: occ  . Drug use: No    Home Medications Prior to Admission medications   Medication Sig Start Date End Date Taking?  Authorizing Provider  oxyCODONE-acetaminophen (PERCOCET) 5-325 MG tablet Take 2 tablets by mouth every 4 (four) hours as needed. 10/24/20  Yes Niala Stcharles, Corene Cornea, MD  amLODipine (NORVASC) 5 MG tablet Take 5 mg by mouth daily.    [provider]  benazepril-hydrochlorthiazide (LOTENSIN HCT) 20-12.5 MG per tablet Take 1 tablet by mouth daily.    [provider]  calcium carbonate (TUMS) 500 MG chewable tablet Chew by mouth.    [provider]  Cholecalciferol (VITAMIN D3) 2000 units capsule Take by mouth daily.    [provider]  Cinnamon 500 MG TABS Take 1,000 mg by mouth 2 (two) times daily at 10 AM and 5 PM.    [provider]  Coenzyme Q10 (CO Q 10) 10 MG CAPS Take 10 mg by mouth 2 (two) times daily.    [provider]  DOCOSAHEXAENOIC ACID PO Take 1 g by  mouth.    [provider]  fexofenadine (ALLEGRA) 180 MG tablet Take 180 mg by mouth daily.    [provider]  Flaxseed, Linseed, (FLAX SEEDS PO) Take by mouth daily.    [provider]  Lactobacillus (PROBIOTIC ACIDOPHILUS PO) Take by mouth daily.    [provider]  levothyroxine (SYNTHROID, LEVOTHROID) 125 MCG tablet Take 125 mcg by mouth daily before breakfast.    [provider]  meloxicam (MOBIC) 15 MG tablet Take by mouth.    [provider]  metoprolol tartrate (LOPRESSOR) 50 MG tablet Take 1 tablet (50 mg total) by mouth once for 1 dose. Take 1 hour prior to Coronary CT. 09/22/17 09/22/17  Dorothy Spark, MD  mometasone (NASONEX) 50 MCG/ACT nasal spray Nasonex 50 mcg/actuation Spray  Spray 2 sprays every day by intranasal route.    [provider]  omeprazole (PRILOSEC) 40 MG capsule Take 40 mg by mouth 2 (two) times daily.     [provider]  raloxifene (EVISTA) 60 MG tablet Take by mouth.    [provider]  triamcinolone (NASACORT) 55 MCG/ACT AERO nasal inhaler Place 2 sprays into both nostrils at bedtime.    [provider]    Allergies    Patient has no known allergies.  Review of Systems   Review of Systems  All other systems reviewed and are negative.   Physical Exam Updated Vital Signs BP 119/75 (BP Location: Right Arm)   Pulse 82   Temp 98.5 F (36.9 C) (Oral)   Resp 19   SpO2 97%   Physical Exam Vitals and nursing note reviewed.  Constitutional:      Appearance: She is well-developed.  HENT:     Head: Normocephalic.     Comments: Abrasions to forehead, nose and cheek    Nose: Nose normal.     Mouth/Throat:     Mouth: Mucous membranes are moist.     Pharynx: Oropharynx is clear.  Eyes:     Pupils: Pupils are equal, round, and reactive to light.  Cardiovascular:     Rate and Rhythm: Normal rate and regular rhythm.  Pulmonary:     Effort: No respiratory  distress.     Breath sounds: No stridor.  Abdominal:     General: There is no distension.  Musculoskeletal:        General: Swelling, tenderness and deformity (right small finger) present.     Cervical back: Normal range of motion.     Comments: Abrasions to volar surface of small finger right hand  Skin:  General: Skin is warm and dry.  Neurological:     Mental Status: She is alert.     ED Results / Procedures / Treatments   Labs (all labs ordered are listed, but only abnormal results are displayed) Labs Reviewed - No data to display  EKG None  Radiology CT Head Wo Contrast  Result Date: 10/23/2020 CLINICAL DATA:  Facial trauma. EXAM: CT HEAD WITHOUT CONTRAST TECHNIQUE: Contiguous axial images were obtained from the base of the skull through the vertex without intravenous contrast. COMPARISON:  None. FINDINGS: Brain: No evidence of acute infarction, hemorrhage, hydrocephalus, extra-axial collection or mass lesion/mass effect. Vascular: No hyperdense vessel or unexpected calcification. Skull: Normal. Negative for fracture or focal lesion. Sinuses/Orbits: No acute finding. Other: None. IMPRESSION: No acute intracranial abnormality. Electronically Signed   By: Fidela Salisbury M.D.   On: 10/23/2020 21:28   DG Hand Complete Right  Result Date: 10/24/2020 CLINICAL DATA:  Fifth proximal phalangeal fracture, status post splinting EXAM: RIGHT HAND - COMPLETE 3+ VIEW COMPARISON:  Films from earlier in the same day. FINDINGS: Splinting material is now seen. Previously seen fifth proximal phalangeal fracture now shows decreased angulation . Degenerative changes of the first [facility] joint are noted. IMPRESSION: Status post splinting and mild reduction of the fifth proximal phalangeal fracture. Electronically Signed   By: Inez Catalina M.D.   On: 10/24/2020 02:03   DG Hand Complete Right  Result Date: 10/24/2020 CLINICAL DATA:  Fall, injury EXAM: RIGHT HAND - COMPLETE 3+ VIEW COMPARISON:  None.  FINDINGS: Three view radiograph of the right hand demonstrates an acute, transverse extra-articular fracture of the base of the right fifth proximal phalanx with 1 shaft with volar displacement and near 90 degrees dorsal angulation of the distal fracture fragment. Extensive surrounding soft tissue swelling. This is best seen on closed hand lateral view. No other fracture or dislocation identified. Advanced degenerative changes are seen at the first carpometacarpal joint at the base of the thumb. Remaining joint spaces appear preserved. IMPRESSION: Extra-articular fracture of the base of the right fifth proximal phalanx with marked dorsal angulation and volar displacement of the distal fracture fragment. Electronically Signed   By: Fidela Salisbury MD   On: 10/24/2020 00:54    Procedures .Ortho Injury Treatment  Date/Time: 10/25/2020 1:46 AM Performed by: Merrily Pew, MD Authorized by: Merrily Pew, MD   Consent:    Consent obtained:  Verbal and written   Consent given by:  Patient   Risks discussed:  Fracture, irreducible dislocation, recurrent dislocation, nerve damage, restricted joint movement, vascular damage and stiffness   Alternatives discussed:  No treatment, alternative treatment and immobilizationInjury location: finger Location details: right little finger Injury type: fracture-dislocation Fracture type: proximal phalanx MCP joint involved: yes Pre-procedure neurovascular assessment: neurovascularly intact Pre-procedure distal perfusion: normal Pre-procedure neurological function: normal Pre-procedure range of motion: reduced Anesthesia: digital block  Anesthesia: Local anesthesia used: yes Local Anesthetic: lidocaine 1% without epinephrine Anesthetic total: 3 mL  Patient sedated: NoManipulation performed: yes Skin traction used: no Skeletal traction used: no Reduction successful: no (improved but not totally successful) X-ray confirmed reduction: yes Immobilization:  splint Splint type: ulnar gutter Splint Applied by: ED Provider and Ortho Tech Supplies used: Ortho-Glass Post-procedure neurovascular assessment: post-procedure neurovascularly intact Post-procedure distal perfusion: normal Post-procedure neurological function: normal Post-procedure range of motion: improved      Medications Ordered in ED Medications  oxyCODONE-acetaminophen (PERCOCET/ROXICET) 5-325 MG per tablet 1 tablet (1 tablet Oral Given 10/24/20 0025)  lidocaine (PF) (XYLOCAINE) 1 %  injection 5 mL (5 mLs Other Given 10/24/20 0106)    ED Course  I have reviewed the triage vital signs and the nursing notes.  Pertinent labs & imaging results that were available during my care of the patient were reviewed by me and considered in my medical decision making (see chart for details).    MDM Rules/Calculators/A&P                          Fracture improved alignment but not stable enough to stay in place even with splint. Will dc to follow up with hand surgery. Pain meds provided.   Final Clinical Impression(s) / ED Diagnoses Final diagnoses:  Fall, initial encounter  Closed displaced fracture of proximal phalanx of right little finger, initial encounter    Rx / DC Orders ED Discharge Orders         Ordered    oxyCODONE-acetaminophen (PERCOCET) 5-325 MG tablet  Every 4 hours PRN        10/24/20 0208    Ambulatory referral to Hand Surgery        10/24/20 0208           Shantell Belongia, Corene Cornea, MD 10/25/20 0150

## 2020-10-27 ENCOUNTER — Other Ambulatory Visit: Payer: Self-pay | Admitting: Orthopedic Surgery

## 2020-10-27 ENCOUNTER — Other Ambulatory Visit (HOSPITAL_COMMUNITY)
Admission: RE | Admit: 2020-10-27 | Discharge: 2020-10-27 | Disposition: A | Discharge status: Home / Self Care | Payer: Medicare PPO | Source: Ambulatory Visit | Attending: Orthopedic Surgery | Admitting: Orthopedic Surgery

## 2020-10-27 DIAGNOSIS — Z20822 Contact with and (suspected) exposure to covid-19: Secondary | ICD-10-CM | POA: Diagnosis not present

## 2020-10-27 DIAGNOSIS — S62616A Displaced fracture of proximal phalanx of right little finger, initial encounter for closed fracture: Secondary | ICD-10-CM | POA: Insufficient documentation

## 2020-10-27 DIAGNOSIS — Z01812 Encounter for preprocedural laboratory examination: Secondary | ICD-10-CM | POA: Insufficient documentation

## 2020-10-27 LAB — SARS CORONAVIRUS 2 (TAT 6-24 HRS): SARS Coronavirus 2: NEGATIVE

## 2020-10-28 ENCOUNTER — Encounter (HOSPITAL_BASED_OUTPATIENT_CLINIC_OR_DEPARTMENT_OTHER): Payer: Self-pay | Admitting: Orthopedic Surgery

## 2020-10-28 ENCOUNTER — Other Ambulatory Visit: Payer: Self-pay

## 2020-10-30 ENCOUNTER — Other Ambulatory Visit: Payer: Self-pay

## 2020-10-30 ENCOUNTER — Ambulatory Visit (HOSPITAL_BASED_OUTPATIENT_CLINIC_OR_DEPARTMENT_OTHER): Payer: Medicare PPO | Admitting: Anesthesiology

## 2020-10-30 ENCOUNTER — Encounter (HOSPITAL_BASED_OUTPATIENT_CLINIC_OR_DEPARTMENT_OTHER)
Admission: RE | Disposition: A | Discharge status: Home / Self Care | Payer: Self-pay | Source: Home / Self Care | Attending: Orthopedic Surgery

## 2020-10-30 ENCOUNTER — Ambulatory Visit (HOSPITAL_BASED_OUTPATIENT_CLINIC_OR_DEPARTMENT_OTHER)
Admission: RE | Admit: 2020-10-30 | Discharge: 2020-10-30 | Disposition: A | Discharge status: Home / Self Care | Payer: Medicare PPO | Attending: Orthopedic Surgery | Admitting: Orthopedic Surgery

## 2020-10-30 ENCOUNTER — Encounter (HOSPITAL_BASED_OUTPATIENT_CLINIC_OR_DEPARTMENT_OTHER): Payer: Self-pay | Admitting: Orthopedic Surgery

## 2020-10-30 DIAGNOSIS — I10 Essential (primary) hypertension: Secondary | ICD-10-CM | POA: Insufficient documentation

## 2020-10-30 DIAGNOSIS — Y939 Activity, unspecified: Secondary | ICD-10-CM | POA: Insufficient documentation

## 2020-10-30 DIAGNOSIS — Z803 Family history of malignant neoplasm of breast: Secondary | ICD-10-CM | POA: Diagnosis not present

## 2020-10-30 DIAGNOSIS — S62616A Displaced fracture of proximal phalanx of right little finger, initial encounter for closed fracture: Secondary | ICD-10-CM | POA: Insufficient documentation

## 2020-10-30 DIAGNOSIS — W19XXXA Unspecified fall, initial encounter: Secondary | ICD-10-CM | POA: Insufficient documentation

## 2020-10-30 DIAGNOSIS — K219 Gastro-esophageal reflux disease without esophagitis: Secondary | ICD-10-CM | POA: Insufficient documentation

## 2020-10-30 DIAGNOSIS — Z8249 Family history of ischemic heart disease and other diseases of the circulatory system: Secondary | ICD-10-CM | POA: Insufficient documentation

## 2020-10-30 DIAGNOSIS — R7303 Prediabetes: Secondary | ICD-10-CM | POA: Insufficient documentation

## 2020-10-30 DIAGNOSIS — Z833 Family history of diabetes mellitus: Secondary | ICD-10-CM | POA: Diagnosis not present

## 2020-10-30 DIAGNOSIS — Z79899 Other long term (current) drug therapy: Secondary | ICD-10-CM | POA: Diagnosis not present

## 2020-10-30 DIAGNOSIS — E039 Hypothyroidism, unspecified: Secondary | ICD-10-CM | POA: Insufficient documentation

## 2020-10-30 DIAGNOSIS — E785 Hyperlipidemia, unspecified: Secondary | ICD-10-CM | POA: Diagnosis not present

## 2020-10-30 HISTORY — PX: CLOSED REDUCTION FINGER WITH PERCUTANEOUS PINNING: SHX5612

## 2020-10-30 LAB — BASIC METABOLIC PANEL
Anion gap: 9 (ref 5–15)
BUN: 8 mg/dL (ref 8–23)
CO2: 22 mmol/L (ref 22–32)
Calcium: 9.1 mg/dL (ref 8.9–10.3)
Chloride: 106 mmol/L (ref 98–111)
Creatinine, Ser: 0.68 mg/dL (ref 0.44–1.00)
GFR, Estimated: >60 mL/min (ref >60)
Glucose, Bld: 103 mg/dL — ABNORMAL HIGH (ref 70–99)
Potassium: 4 mmol/L (ref 3.5–5.1)
Sodium: 137 mmol/L (ref 135–145)

## 2020-10-30 SURGERY — CLOSED REDUCTION, FINGER, WITH PERCUTANEOUS PINNING
Anesthesia: General | Site: Finger | Laterality: Right

## 2020-10-30 MED ORDER — PROMETHAZINE HCL 25 MG/ML IJ SOLN: 6.2500 mg | INTRAMUSCULAR | Status: DC | PRN

## 2020-10-30 MED ORDER — HYDROCODONE-ACETAMINOPHEN 5-325 MG PO TABS: ORAL_TABLET | ORAL | 0 refills | Status: DC

## 2020-10-30 MED ORDER — FENTANYL CITRATE (PF) 100 MCG/2ML IJ SOLN
INTRAMUSCULAR | Status: DC | PRN
  Administered 2020-10-30 (×2): 25 ug via INTRAVENOUS

## 2020-10-30 MED ORDER — OXYCODONE HCL 5 MG PO TABS
ORAL_TABLET | ORAL | Status: AC
  Filled 2020-10-30: qty 1

## 2020-10-30 MED ORDER — OXYCODONE HCL 5 MG PO TABS
5.0000 mg | ORAL_TABLET | Freq: Once | ORAL | Status: AC | PRN
  Administered 2020-10-30: 5 mg via ORAL

## 2020-10-30 MED ORDER — LACTATED RINGERS IV SOLN: INTRAVENOUS | Status: DC

## 2020-10-30 MED ORDER — DEXAMETHASONE SODIUM PHOSPHATE 10 MG/ML IJ SOLN
INTRAMUSCULAR | Status: DC | PRN
  Administered 2020-10-30: 4 mg via INTRAVENOUS

## 2020-10-30 MED ORDER — LIDOCAINE HCL (CARDIAC) PF 100 MG/5ML IV SOSY
PREFILLED_SYRINGE | INTRAVENOUS | Status: DC | PRN
  Administered 2020-10-30: 60 mg via INTRAVENOUS

## 2020-10-30 MED ORDER — ONDANSETRON HCL 4 MG/2ML IJ SOLN
INTRAMUSCULAR | Status: DC | PRN
  Administered 2020-10-30: 4 mg via INTRAVENOUS

## 2020-10-30 MED ORDER — ONDANSETRON HCL 4 MG/2ML IJ SOLN
INTRAMUSCULAR | Status: AC
  Filled 2020-10-30: qty 2

## 2020-10-30 MED ORDER — LIDOCAINE 2% (20 MG/ML) 5 ML SYRINGE
INTRAMUSCULAR | Status: AC
  Filled 2020-10-30: qty 5

## 2020-10-30 MED ORDER — HYDROMORPHONE HCL 1 MG/ML IJ SOLN: 0.2500 mg | INTRAMUSCULAR | Status: DC | PRN

## 2020-10-30 MED ORDER — CEFAZOLIN SODIUM-DEXTROSE 2-4 GM/100ML-% IV SOLN
INTRAVENOUS | Status: AC
  Filled 2020-10-30: qty 100

## 2020-10-30 MED ORDER — PROPOFOL 10 MG/ML IV BOLUS
INTRAVENOUS | Status: DC | PRN
  Administered 2020-10-30: 200 mg via INTRAVENOUS
  Administered 2020-10-30: 50 mg via INTRAVENOUS

## 2020-10-30 MED ORDER — AMISULPRIDE (ANTIEMETIC) 5 MG/2ML IV SOLN: 10.0000 mg | Freq: Once | INTRAVENOUS | Status: DC | PRN

## 2020-10-30 MED ORDER — CEFAZOLIN SODIUM-DEXTROSE 2-4 GM/100ML-% IV SOLN
2.0000 g | INTRAVENOUS | Status: AC
  Administered 2020-10-30: 2 g via INTRAVENOUS

## 2020-10-30 MED ORDER — PROPOFOL 10 MG/ML IV BOLUS
INTRAVENOUS | Status: AC
  Filled 2020-10-30: qty 20

## 2020-10-30 MED ORDER — FENTANYL CITRATE (PF) 100 MCG/2ML IJ SOLN
INTRAMUSCULAR | Status: AC
  Filled 2020-10-30: qty 2

## 2020-10-30 MED ORDER — BUPIVACAINE HCL (PF) 0.25 % IJ SOLN
INTRAMUSCULAR | Status: DC | PRN
  Administered 2020-10-30: 7 mL

## 2020-10-30 MED ORDER — OXYCODONE HCL 5 MG/5ML PO SOLN: 5.0000 mg | Freq: Once | ORAL | Status: AC | PRN

## 2020-10-30 SURGICAL SUPPLY — 43 items
APL PRP STRL LF DISP 70% ISPRP (MISCELLANEOUS) ×1
BLADE SURG 15 STRL LF DISP TIS (BLADE) ×2 IMPLANT
BLADE SURG 15 STRL SS (BLADE) ×2
BNDG CMPR 9X4 STRL LF SNTH (GAUZE/BANDAGES/DRESSINGS) ×1
BNDG ELASTIC 2X5.8 VLCR STR LF (GAUZE/BANDAGES/DRESSINGS) IMPLANT
BNDG ELASTIC 3X5.8 VLCR STR LF (GAUZE/BANDAGES/DRESSINGS) ×2 IMPLANT
BNDG ESMARK 4X9 LF (GAUZE/BANDAGES/DRESSINGS) ×2 IMPLANT
BNDG GAUZE ELAST 4 BULKY (GAUZE/BANDAGES/DRESSINGS) ×2 IMPLANT
CHLORAPREP W/TINT 26 (MISCELLANEOUS) ×2 IMPLANT
CORD BIPOLAR FORCEPS 12FT (ELECTRODE) IMPLANT
COVER BACK TABLE 60X90IN (DRAPES) ×2 IMPLANT
COVER MAYO STAND STRL (DRAPES) ×2 IMPLANT
COVER WAND RF STERILE (DRAPES) IMPLANT
CUFF TOURN SGL QUICK 18X4 (TOURNIQUET CUFF) ×2 IMPLANT
DRAPE EXTREMITY T 121X128X90 (DISPOSABLE) ×2 IMPLANT
DRAPE OEC MINIVIEW 54X84 (DRAPES) ×2 IMPLANT
DRAPE SURG 17X23 STRL (DRAPES) ×2 IMPLANT
GAUZE SPONGE 4X4 12PLY STRL (GAUZE/BANDAGES/DRESSINGS) ×2 IMPLANT
GAUZE XEROFORM 1X8 LF (GAUZE/BANDAGES/DRESSINGS) ×2 IMPLANT
GLOVE SRG 8 PF TXTR STRL LF DI (GLOVE) ×1 IMPLANT
GLOVE SURG ENC MOIS LTX SZ7.5 (GLOVE) ×2 IMPLANT
GLOVE SURG ORTHO LTX SZ8 (GLOVE) IMPLANT
GLOVE SURG UNDER POLY LF SZ8 (GLOVE) ×2
GLOVE SURG UNDER POLY LF SZ8.5 (GLOVE) IMPLANT
GOWN STRL REUS W/ TWL LRG LVL3 (GOWN DISPOSABLE) ×1 IMPLANT
GOWN STRL REUS W/TWL LRG LVL3 (GOWN DISPOSABLE) ×2
K-WIRE .035X4 (WIRE) ×2 IMPLANT
NDL HYPO 25X1 1.5 SAFETY (NEEDLE) IMPLANT
NEEDLE HYPO 25X1 1.5 SAFETY (NEEDLE) ×2 IMPLANT
NS IRRIG 1000ML POUR BTL (IV SOLUTION) ×2 IMPLANT
PACK BASIN DAY SURGERY FS (CUSTOM PROCEDURE TRAY) ×2 IMPLANT
PAD CAST 3X4 CTTN HI CHSV (CAST SUPPLIES) IMPLANT
PAD CAST 4YDX4 CTTN HI CHSV (CAST SUPPLIES) IMPLANT
PADDING CAST COTTON 3X4 STRL (CAST SUPPLIES)
PADDING CAST COTTON 4X4 STRL (CAST SUPPLIES)
SLEEVE SCD COMPRESS KNEE MED (STOCKING) ×1 IMPLANT
STOCKINETTE 4X48 STRL (DRAPES) ×2 IMPLANT
SUT ETHILON 3 0 PS 1 (SUTURE) IMPLANT
SUT ETHILON 4 0 PS 2 18 (SUTURE) IMPLANT
SYR BULB EAR ULCER 3OZ GRN STR (SYRINGE) ×1 IMPLANT
SYR CONTROL 10ML LL (SYRINGE) IMPLANT
TOWEL GREEN STERILE FF (TOWEL DISPOSABLE) ×4 IMPLANT
UNDERPAD 30X36 HEAVY ABSORB (UNDERPADS AND DIAPERS) ×2 IMPLANT

## 2020-10-30 NOTE — H&P (Signed)
Stacey Coleman is an 70 y.o. female.   Chief Complaint: Finger fracture HPI: 70 year old female states she fell last week injuring her right small finger.  Was seen at the emergency department where radiographs were taken revealing a fracture at the base of the small finger proximal phalanx which was placed in splint followed up in the office.  She wishes to proceed with operative reduction and fixation.  Allergies: No Known Allergies  Past Medical History:  Diagnosis Date  . Anemia 2013  . Arthritis   . 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 Family history of adverse reaction to anesthesia    mother nausea/vomiting  . GERD (gastroesophageal reflux disease)   . Hypertension   . Hypothyroid   . Hypothyroidism   . Laryngotracheal cleft    laryngotracheal reflux  . Nasal congestion    nasal dripping  . Osteoporosis   . Prediabetes   . Seasonal allergies   . Sleep apnea    mild, no CPAP  . Wears hearing aid    both ears  . Wears partial dentures     Past Surgical History:  Procedure Laterality Date  . ANKLE ARTHROSCOPY  2008   spurs-left  . BONE RESECTION  2004   left humerous  . COLONOSCOPY    . KNEE ARTHROSCOPY  1990   rt  . KNEE ARTHROSCOPY  2002   rt  . KNEE ARTHROSCOPY W/ ACL RECONSTRUCTION  2001   right  . KNEE ARTHROSCOPY WITH MEDIAL MENISECTOMY Left 12/13/2013   Procedure: LEFT KNEE ARTHROSCOPY WITH MEDIAL MENISECTOMY, PLICA EXCISION, CHONDROPLASTY;  Surgeon: Ninetta Lights, MD;  Location: [place];  Service: Orthopedics;  Laterality: Left;  . SHOULDER SURGERY Left 2004   Removed chondro sarcoma  . TOTAL KNEE ARTHROPLASTY Right 08/28/2014   Procedure: RIGHT TOTAL KNEE ARTHROPLASTY;  Surgeon: Ninetta Lights, MD;  Location: [place];  Service: Orthopedics;  Laterality: Right;    Family History: Family History  Problem Relation Age of Onset  . Breast cancer Mother   . Heart attack Father   . Hypertension Father   . Colon cancer  Paternal Grandfather   . Heart attack Maternal Grandfather   . Diabetes Brother   . Diabetes Other     Social History:   reports that she has never smoked. She has never used smokeless tobacco. She reports current alcohol use. She reports that she does not use drugs.  Medications: Medications Prior to Admission  Medication Sig Dispense Refill  . amLODipine (NORVASC) 5 MG tablet Take 5 mg by mouth daily.    . benazepril-hydrochlorthiazide (LOTENSIN HCT) 20-12.5 MG per tablet Take 1 tablet by mouth daily.    . calcium carbonate (TUMS - DOSED IN MG ELEMENTAL CALCIUM) 500 MG chewable tablet Chew by mouth.    . calcium gluconate 500 MG tablet Take 1 tablet by mouth 3 (three) times daily.    . Cholecalciferol (VITAMIN D3) 2000 units capsule Take by mouth daily.    . Lactobacillus (PROBIOTIC ACIDOPHILUS PO) Take by mouth daily.    Marland Kitchen levothyroxine (SYNTHROID, LEVOTHROID) 125 MCG tablet Take 125 mcg by mouth daily before breakfast.    . methocarbamol (ROBAXIN) 500 MG tablet Take 500 mg by mouth 4 (four) times daily.    Marland Kitchen omeprazole (PRILOSEC) 40 MG capsule Take 40 mg by mouth 2 (two) times daily.     Marland Kitchen oxybutynin (DITROPAN) 5 MG tablet Take 5 mg by mouth daily.    Marland Kitchen  oxyCODONE-acetaminophen (PERCOCET) 5-325 MG tablet Take 2 tablets by mouth every 4 (four) hours as needed. 20 tablet 0  . raloxifene (EVISTA) 60 MG tablet Take by mouth.    . Red Yeast Rice 600 MG CAPS Take by mouth.    . triamcinolone (NASACORT) 55 MCG/ACT AERO nasal inhaler Place 2 sprays into both nostrils at bedtime.    . Turmeric 450 MG CAPS Take by mouth.    . Cinnamon 500 MG TABS Take 1,000 mg by mouth 2 (two) times daily at 10 AM and 5 PM.    . fexofenadine (ALLEGRA) 180 MG tablet Take 180 mg by mouth daily.    . Flaxseed, Linseed, (FLAX SEEDS PO) Take by mouth daily.      No results found for this or any previous visit (from the past 48 hour(s)).  No results found.   A comprehensive review of systems was  negative.  Blood pressure (!) 148/81, pulse 85, temperature 98.1 F (36.7 C), temperature source Oral, resp. rate 16, height 4\' 11"  (1.499 m), weight 65.7 kg, SpO2 100 %.  General appearance: alert, cooperative and appears stated age Head: Normocephalic, without obvious abnormality, atraumatic Neck: supple, symmetrical, trachea midline Cardio: regular rate and rhythm Resp: clear to auscultation bilaterally Extremities: Intact sensation and capillary refill all digits.  +epl/fpl/io.  No wounds.  Pulses: 2+ and symmetric Skin: Skin color, texture, turgor normal. No rashes or lesions Neurologic: Grossly normal Incision/Wound: none  Assessment/Plan Right small finger proximal phalanx fracture.  Non operative and operative treatment options have been discussed with the patient and patient wishes to proceed with operative treatment. Risks, benefits, and alternatives of surgery have been discussed and the patient agrees with the plan of care.   [name] 10/30/2020, 10:21 AM

## 2020-10-30 NOTE — Transfer of Care (Signed)
Immediate Anesthesia Transfer of Care Note  Patient: Stacey Coleman  Procedure(s) Performed: CLOSED REDUCTION RIGHT SMALL PROXIMAL PHALANX FRACTURE OF  FINGER WITH PERCUTANEOUS PINNING (Right Finger)  Patient Location: PACU  Anesthesia Type:General  Level of Consciousness: awake, alert , oriented, drowsy and patient cooperative  Airway & Oxygen Therapy: Patient Spontanous Breathing and Patient connected to face mask oxygen  Post-op Assessment: Report given to RN and Post -op Vital signs reviewed and stable  Post vital signs: Reviewed and stable  Last Vitals:  Vitals Value Taken Time  BP    Temp    Pulse    Resp    SpO2      Last Pain:  Vitals:   10/30/20 1009  TempSrc: Oral  PainSc: 2       Patients Stated Pain Goal: 2 (59/92/34 1443)  Complications: No complications documented.

## 2020-10-30 NOTE — Anesthesia Postprocedure Evaluation (Signed)
Anesthesia Post Note  Patient: Stacey Coleman  Procedure(s) Performed: CLOSED REDUCTION RIGHT SMALL PROXIMAL PHALANX FRACTURE OF  FINGER WITH PERCUTANEOUS PINNING (Right Finger)     Patient location during evaluation: PACU Anesthesia Type: General Level of consciousness: awake and alert Pain management: pain level controlled Vital Signs Assessment: post-procedure vital signs reviewed and stable Respiratory status: spontaneous breathing, nonlabored ventilation and respiratory function stable Cardiovascular status: blood pressure returned to baseline and stable Postop Assessment: no apparent nausea or vomiting Anesthetic complications: no   No complications documented.  Last Vitals:  Vitals:   10/30/20 1415 10/30/20 1434  BP: 139/81 133/69  Pulse: 71 78  Resp: 13 14  Temp:  36.7 C  SpO2: 100% 97%    Last Pain:  Vitals:   10/30/20 1505  TempSrc:   PainSc: 2                  Lynda Rainwater

## 2020-10-30 NOTE — Anesthesia Procedure Notes (Signed)
Procedure Name: LMA Insertion Performed by: Ezequiel Kayser, CRNA Pre-anesthesia Checklist: Patient identified, Emergency Drugs available, Suction available and Patient being monitored Patient Re-evaluated:Patient Re-evaluated prior to induction Oxygen Delivery Method: Circle System Utilized Preoxygenation: Pre-oxygenation with 100% oxygen Induction Type: IV induction Ventilation: Mask ventilation without difficulty LMA: LMA inserted LMA Size: 3.0 Number of attempts: 1 Airway Equipment and Method: Bite block Placement Confirmation: positive ETCO2 Tube secured with: Tape Dental Injury: Teeth and Oropharynx as per pre-operative assessment

## 2020-10-30 NOTE — Discharge Instructions (Addendum)

## 2020-10-30 NOTE — Anesthesia Preprocedure Evaluation (Signed)
Anesthesia Evaluation  Patient identified by MRN, date of birth, ID band Patient awake    Reviewed: Allergy & Precautions, NPO status , Patient's Chart, lab work & pertinent test results, reviewed documented beta blocker date and time   Airway Mallampati: II  TM Distance: >3 FB Neck ROM: Full    Dental no notable dental hx. (+) Teeth Intact, Partial Upper, Partial Lower   Pulmonary sleep apnea ,    Pulmonary exam normal breath sounds clear to auscultation       Cardiovascular hypertension, Pt. on medications Normal cardiovascular exam Rhythm:Regular Rate:Normal  EKG WNL 11/2013   Neuro/Psych    GI/Hepatic GERD  Medicated,  Endo/Other  Hypothyroidism (replacdment)   Renal/GU      Musculoskeletal  (+) Arthritis , Osteoarthritis,    Abdominal (+)  Abdomen: soft.    Peds  Hematology  (+) anemia , 13/39   Anesthesia Other Findings   Reproductive/Obstetrics                             Anesthesia Physical  Anesthesia Plan  ASA: II  Anesthesia Plan: General   Post-op Pain Management:    Induction: Intravenous  PONV Risk Score and Plan: 3 and Ondansetron, Dexamethasone, Midazolam and Treatment may vary due to age or medical condition  Airway Management Planned: LMA  Additional Equipment:   Intra-op Plan:   Post-operative Plan: Extubation in OR  Informed Consent: I have reviewed the patients History and Physical, chart, labs and discussed the procedure including the risks, benefits and alternatives for the proposed anesthesia with the patient or authorized representative who has indicated his/her understanding and acceptance.     Dental advisory given  Plan Discussed with: CRNA and Anesthesiologist  Anesthesia Plan Comments:         Anesthesia Quick Evaluation

## 2020-10-30 NOTE — Op Note (Addendum)
NAME: Stacey Coleman MEDICAL RECORD NO: [id number] DATE OF BIRTH: 1951-02-04 FACILITY: [facility] LOCATION: [facility] PHYSICIAN: Tennis Must, MD   OPERATIVE REPORT   DATE OF PROCEDURE: 10/30/20    PREOPERATIVE DIAGNOSIS:   Right small finger proximal phalanx fracture   POSTOPERATIVE DIAGNOSIS:   Right small finger proximal phalanx fracture   PROCEDURE:   Was reduction pin fixation right small finger proximal phalanx fracture   SURGEON:  [name], M.D.   ASSISTANT: none   ANESTHESIA:  General   INTRAVENOUS FLUIDS:  Per anesthesia flow sheet.   ESTIMATED BLOOD LOSS:  Minimal.   COMPLICATIONS:  None.   SPECIMENS:  none   TOURNIQUET TIME:    Total Tourniquet Time Documented: Upper Arm (Right) - 20 minutes Total: Upper Arm (Right) - 20 minutes    DISPOSITION:  Stable to PACU.   INDICATIONS: 70 year old female states she fell last week injuring her right small finger.  She was seen at the emergency department where radiographs were taken revealing a small finger proximal phalanx fracture.  She is placed in splint and follow-up in the office.  She wishes to proceed with operative reduction and fixation. Risks, benefits and alternatives of surgery were discussed including the risks of blood loss, infection, damage to nerves, vessels, tendons, ligaments, bone for surgery, need for additional surgery, complications with wound healing, continued pain, nonunion, malunion,  stiffness.  She voiced understanding of these risks and elected to proceed.  OPERATIVE COURSE:  After being identified preoperatively by myself,  the patient and I agreed on the procedure and site of the procedure.  The surgical site was marked.  Surgical consent had been signed. She was given IV antibiotics as preoperative antibiotic prophylaxis. She was transferred to the operating room and placed on the operating table in supine position with the Right upper extremity on an arm board.  General  anesthesia was induced by the anesthesiologist.  Right upper extremity was prepped and draped in normal sterile orthopedic fashion.  A surgical pause was performed between the surgeons, anesthesia, and operating room staff and all were in agreement as to the patient, procedure, and site of procedure.  Tourniquet at the proximal aspect of the extremity was inflated to 250 mmHg after exsanguination of the arm with an Esmarch bandage.    C-arm was used in AP and lateral projections throughout the case.  Close reduction of the right small finger proximal phalanx was performed.  Good reduction was obtained.  Two 0.035 inch K wires were used.  They were advanced from the proximal aspect of the proximal phalanx and across fashion across the fracture site and into the phalanx distal to the fracture.  This was adequate to stabilize the fracture.  The wrist was placed through tenodesis and there is no scissoring.  There is good alignment of the digits.  The pins were bent and cut short.  The pin sites were dressed with sterile Xeroform.  The abrasions on her hand were dressed with sterile Xeroform 4 x 4's and wrapped with a Kerlix bandage.  A volar and dorsal slab splint including the long ring and small fingers was placed with the MPs flexed and the IP is extended.  This was wrapped with Kerlix and Ace bandage.  The tourniquet was deflated at 20 minutes.  Fingertips were pink with brisk capillary refill after deflation of tourniquet.  The operative  drapes were broken down.  The patient was awoken from anesthesia safely.  She was  transferred back to the stretcher and taken to PACU in stable condition.  I will see her back in the office in 1 week for postoperative followup.  I will give her a prescription for Norco 5/325 1-2 tabs PO q6 hours prn pain, dispense # 25.   [name], MD Electronically signed, 10/30/20

## 2020-11-04 ENCOUNTER — Encounter (HOSPITAL_BASED_OUTPATIENT_CLINIC_OR_DEPARTMENT_OTHER): Payer: Self-pay | Admitting: Orthopedic Surgery

## 2020-11-07 DIAGNOSIS — M79644 Pain in right finger(s): Secondary | ICD-10-CM | POA: Diagnosis not present

## 2020-11-07 DIAGNOSIS — M25641 Stiffness of right hand, not elsewhere classified: Secondary | ICD-10-CM | POA: Diagnosis not present

## 2020-11-07 DIAGNOSIS — S62616D Displaced fracture of proximal phalanx of right little finger, subsequent encounter for fracture with routine healing: Secondary | ICD-10-CM | POA: Diagnosis not present

## 2020-11-21 DIAGNOSIS — S62616D Displaced fracture of proximal phalanx of right little finger, subsequent encounter for fracture with routine healing: Secondary | ICD-10-CM | POA: Diagnosis not present

## 2020-11-24 DIAGNOSIS — N3941 Urge incontinence: Secondary | ICD-10-CM | POA: Diagnosis not present

## 2020-11-24 DIAGNOSIS — H35373 Puckering of macula, bilateral: Secondary | ICD-10-CM | POA: Diagnosis not present

## 2020-11-24 DIAGNOSIS — H35362 Drusen (degenerative) of macula, left eye: Secondary | ICD-10-CM | POA: Diagnosis not present

## 2020-11-24 DIAGNOSIS — H43812 Vitreous degeneration, left eye: Secondary | ICD-10-CM | POA: Diagnosis not present

## 2020-11-24 DIAGNOSIS — H442E3 Degenerative myopia with other maculopathy, bilateral eye: Secondary | ICD-10-CM | POA: Diagnosis not present

## 2020-11-24 DIAGNOSIS — N8110 Cystocele, unspecified: Secondary | ICD-10-CM | POA: Diagnosis not present

## 2020-11-25 DIAGNOSIS — M25641 Stiffness of right hand, not elsewhere classified: Secondary | ICD-10-CM | POA: Diagnosis not present

## 2020-11-25 DIAGNOSIS — M79644 Pain in right finger(s): Secondary | ICD-10-CM | POA: Diagnosis not present

## 2020-11-25 DIAGNOSIS — S62616D Displaced fracture of proximal phalanx of right little finger, subsequent encounter for fracture with routine healing: Secondary | ICD-10-CM | POA: Diagnosis not present

## 2020-11-28 DIAGNOSIS — S62616D Displaced fracture of proximal phalanx of right little finger, subsequent encounter for fracture with routine healing: Secondary | ICD-10-CM | POA: Diagnosis not present

## 2020-12-09 DIAGNOSIS — S62616D Displaced fracture of proximal phalanx of right little finger, subsequent encounter for fracture with routine healing: Secondary | ICD-10-CM | POA: Diagnosis not present

## 2020-12-09 DIAGNOSIS — M79644 Pain in right finger(s): Secondary | ICD-10-CM | POA: Diagnosis not present

## 2020-12-09 DIAGNOSIS — M25641 Stiffness of right hand, not elsewhere classified: Secondary | ICD-10-CM | POA: Diagnosis not present

## 2020-12-19 DIAGNOSIS — S62616D Displaced fracture of proximal phalanx of right little finger, subsequent encounter for fracture with routine healing: Secondary | ICD-10-CM | POA: Diagnosis not present

## 2020-12-23 DIAGNOSIS — S62616D Displaced fracture of proximal phalanx of right little finger, subsequent encounter for fracture with routine healing: Secondary | ICD-10-CM | POA: Diagnosis not present

## 2020-12-23 DIAGNOSIS — M25641 Stiffness of right hand, not elsewhere classified: Secondary | ICD-10-CM | POA: Diagnosis not present

## 2020-12-23 DIAGNOSIS — M79644 Pain in right finger(s): Secondary | ICD-10-CM | POA: Diagnosis not present

## 2020-12-30 DIAGNOSIS — M79644 Pain in right finger(s): Secondary | ICD-10-CM | POA: Diagnosis not present

## 2020-12-30 DIAGNOSIS — M25641 Stiffness of right hand, not elsewhere classified: Secondary | ICD-10-CM | POA: Diagnosis not present

## 2020-12-30 DIAGNOSIS — S62616D Displaced fracture of proximal phalanx of right little finger, subsequent encounter for fracture with routine healing: Secondary | ICD-10-CM | POA: Diagnosis not present

## 2021-01-06 DIAGNOSIS — M79644 Pain in right finger(s): Secondary | ICD-10-CM | POA: Diagnosis not present

## 2021-01-06 DIAGNOSIS — R29898 Other symptoms and signs involving the musculoskeletal system: Secondary | ICD-10-CM | POA: Diagnosis not present

## 2021-01-06 DIAGNOSIS — M25641 Stiffness of right hand, not elsewhere classified: Secondary | ICD-10-CM | POA: Diagnosis not present

## 2021-01-06 DIAGNOSIS — S62616D Displaced fracture of proximal phalanx of right little finger, subsequent encounter for fracture with routine healing: Secondary | ICD-10-CM | POA: Diagnosis not present

## 2021-01-13 DIAGNOSIS — M79644 Pain in right finger(s): Secondary | ICD-10-CM | POA: Diagnosis not present

## 2021-01-13 DIAGNOSIS — R29898 Other symptoms and signs involving the musculoskeletal system: Secondary | ICD-10-CM | POA: Diagnosis not present

## 2021-01-13 DIAGNOSIS — M25641 Stiffness of right hand, not elsewhere classified: Secondary | ICD-10-CM | POA: Diagnosis not present

## 2021-01-13 DIAGNOSIS — S62616D Displaced fracture of proximal phalanx of right little finger, subsequent encounter for fracture with routine healing: Secondary | ICD-10-CM | POA: Diagnosis not present

## 2021-01-16 DIAGNOSIS — S62616D Displaced fracture of proximal phalanx of right little finger, subsequent encounter for fracture with routine healing: Secondary | ICD-10-CM | POA: Diagnosis not present

## 2021-02-23 DIAGNOSIS — L570 Actinic keratosis: Secondary | ICD-10-CM | POA: Diagnosis not present

## 2021-02-23 DIAGNOSIS — L821 Other seborrheic keratosis: Secondary | ICD-10-CM | POA: Diagnosis not present

## 2021-02-23 DIAGNOSIS — D225 Melanocytic nevi of trunk: Secondary | ICD-10-CM | POA: Diagnosis not present

## 2021-02-23 DIAGNOSIS — L578 Other skin changes due to chronic exposure to nonionizing radiation: Secondary | ICD-10-CM | POA: Diagnosis not present

## 2021-02-23 DIAGNOSIS — L814 Other melanin hyperpigmentation: Secondary | ICD-10-CM | POA: Diagnosis not present

## 2021-04-07 DIAGNOSIS — E7800 Pure hypercholesterolemia, unspecified: Secondary | ICD-10-CM | POA: Diagnosis not present

## 2021-04-07 DIAGNOSIS — M85851 Other specified disorders of bone density and structure, right thigh: Secondary | ICD-10-CM | POA: Diagnosis not present

## 2021-04-07 DIAGNOSIS — J387 Other diseases of larynx: Secondary | ICD-10-CM | POA: Diagnosis not present

## 2021-04-07 DIAGNOSIS — E039 Hypothyroidism, unspecified: Secondary | ICD-10-CM | POA: Diagnosis not present

## 2021-04-07 DIAGNOSIS — F439 Reaction to severe stress, unspecified: Secondary | ICD-10-CM | POA: Diagnosis not present

## 2021-04-07 DIAGNOSIS — M545 Low back pain, unspecified: Secondary | ICD-10-CM | POA: Diagnosis not present

## 2021-04-07 DIAGNOSIS — R7303 Prediabetes: Secondary | ICD-10-CM | POA: Diagnosis not present

## 2021-04-07 DIAGNOSIS — I10 Essential (primary) hypertension: Secondary | ICD-10-CM | POA: Diagnosis not present

## 2021-04-07 DIAGNOSIS — G4700 Insomnia, unspecified: Secondary | ICD-10-CM | POA: Diagnosis not present

## 2021-04-21 DIAGNOSIS — H43813 Vitreous degeneration, bilateral: Secondary | ICD-10-CM | POA: Diagnosis not present

## 2021-04-21 DIAGNOSIS — H442E3 Degenerative myopia with other maculopathy, bilateral eye: Secondary | ICD-10-CM | POA: Diagnosis not present

## 2021-04-21 DIAGNOSIS — H35362 Drusen (degenerative) of macula, left eye: Secondary | ICD-10-CM | POA: Diagnosis not present

## 2021-04-21 DIAGNOSIS — H35373 Puckering of macula, bilateral: Secondary | ICD-10-CM | POA: Diagnosis not present

## 2021-04-22 DIAGNOSIS — M19072 Primary osteoarthritis, left ankle and foot: Secondary | ICD-10-CM | POA: Diagnosis not present

## 2021-04-28 DIAGNOSIS — M1811 Unilateral primary osteoarthritis of first carpometacarpal joint, right hand: Secondary | ICD-10-CM | POA: Diagnosis not present

## 2021-04-28 DIAGNOSIS — M180 Bilateral primary osteoarthritis of first carpometacarpal joints: Secondary | ICD-10-CM | POA: Insufficient documentation

## 2021-04-28 DIAGNOSIS — M654 Radial styloid tenosynovitis [de Quervain]: Secondary | ICD-10-CM | POA: Insufficient documentation

## 2021-05-02 IMAGING — DX DG HAND COMPLETE 3+V*R*
3 series · 3 of 3 positions shown · non-contrast
Comparison: Films from earlier in the same day.

CLINICAL DATA: Fifth proximal phalangeal fracture, status post
splinting

EXAM:
RIGHT HAND - COMPLETE 3+ VIEW

[hand pa]
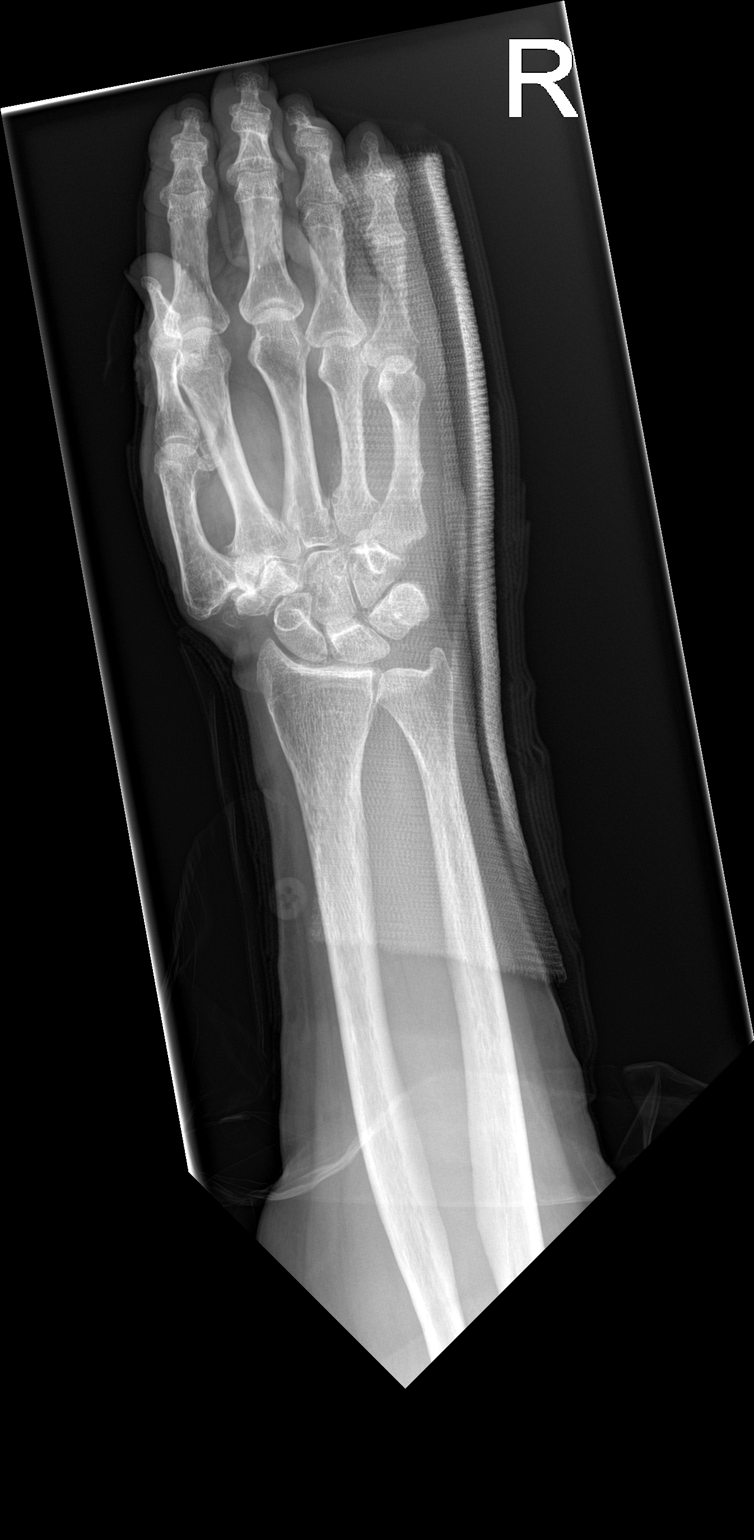

[hand obl]
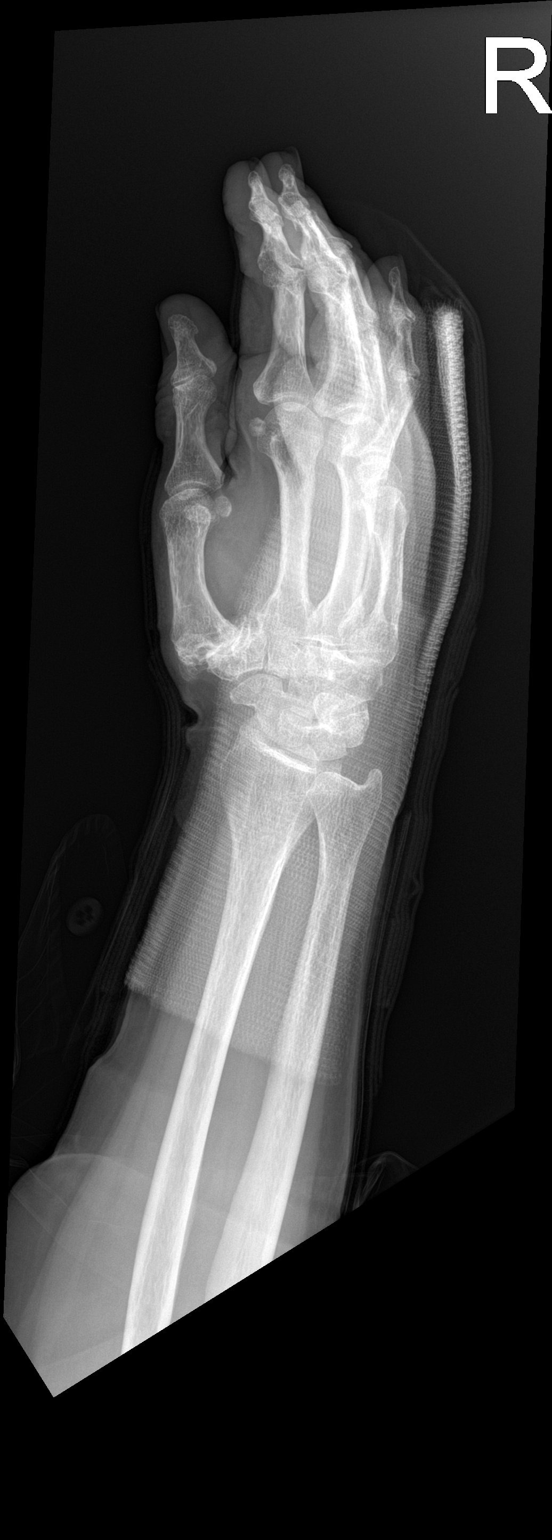

[hand lat]
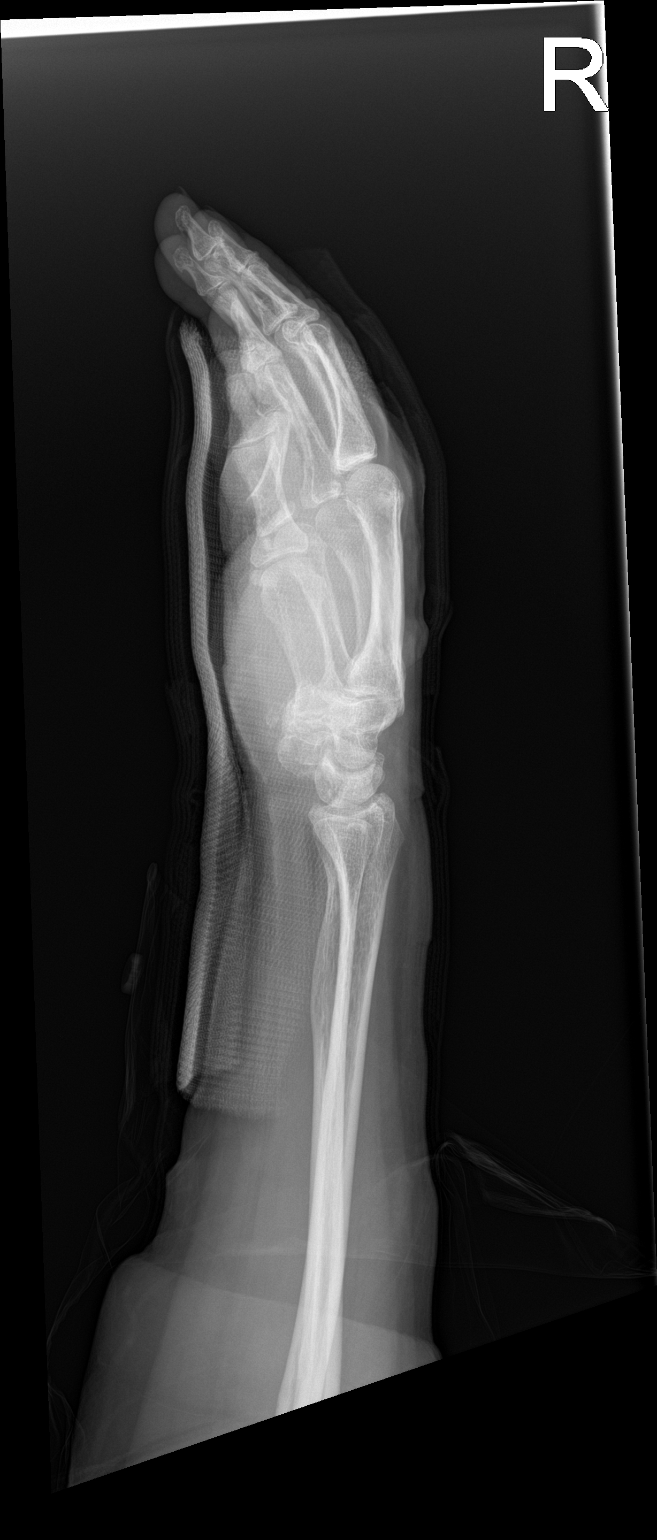

[3 of 3 positions shown; findings below may reference images not displayed]

FINDINGS: Splinting material is now seen. Previously seen fifth proximal
phalangeal fracture now shows decreased angulation . Degenerative
changes of the first CMC joint are noted.
IMPRESSION: Status post splinting and mild reduction of the fifth proximal
phalangeal fracture.

## 2021-05-02 IMAGING — CR DG HAND COMPLETE 3+V*R*
4 series · 4 of 4 positions shown · non-contrast
Comparison: None.

CLINICAL DATA: Fall, injury

EXAM:
RIGHT HAND - COMPLETE 3+ VIEW

[hand pa]
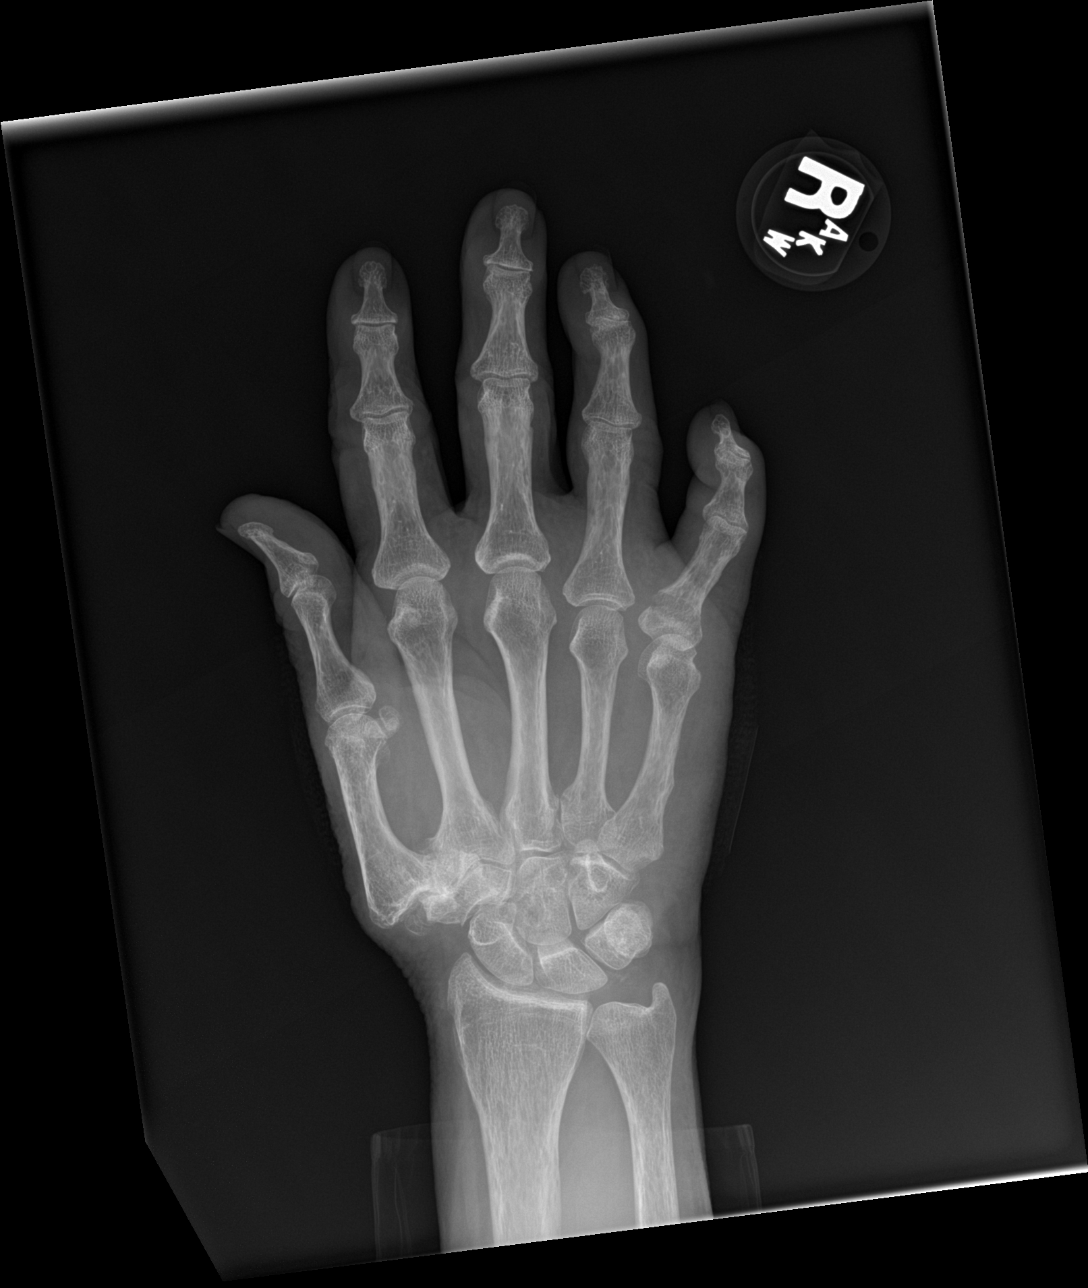

[hand obl]
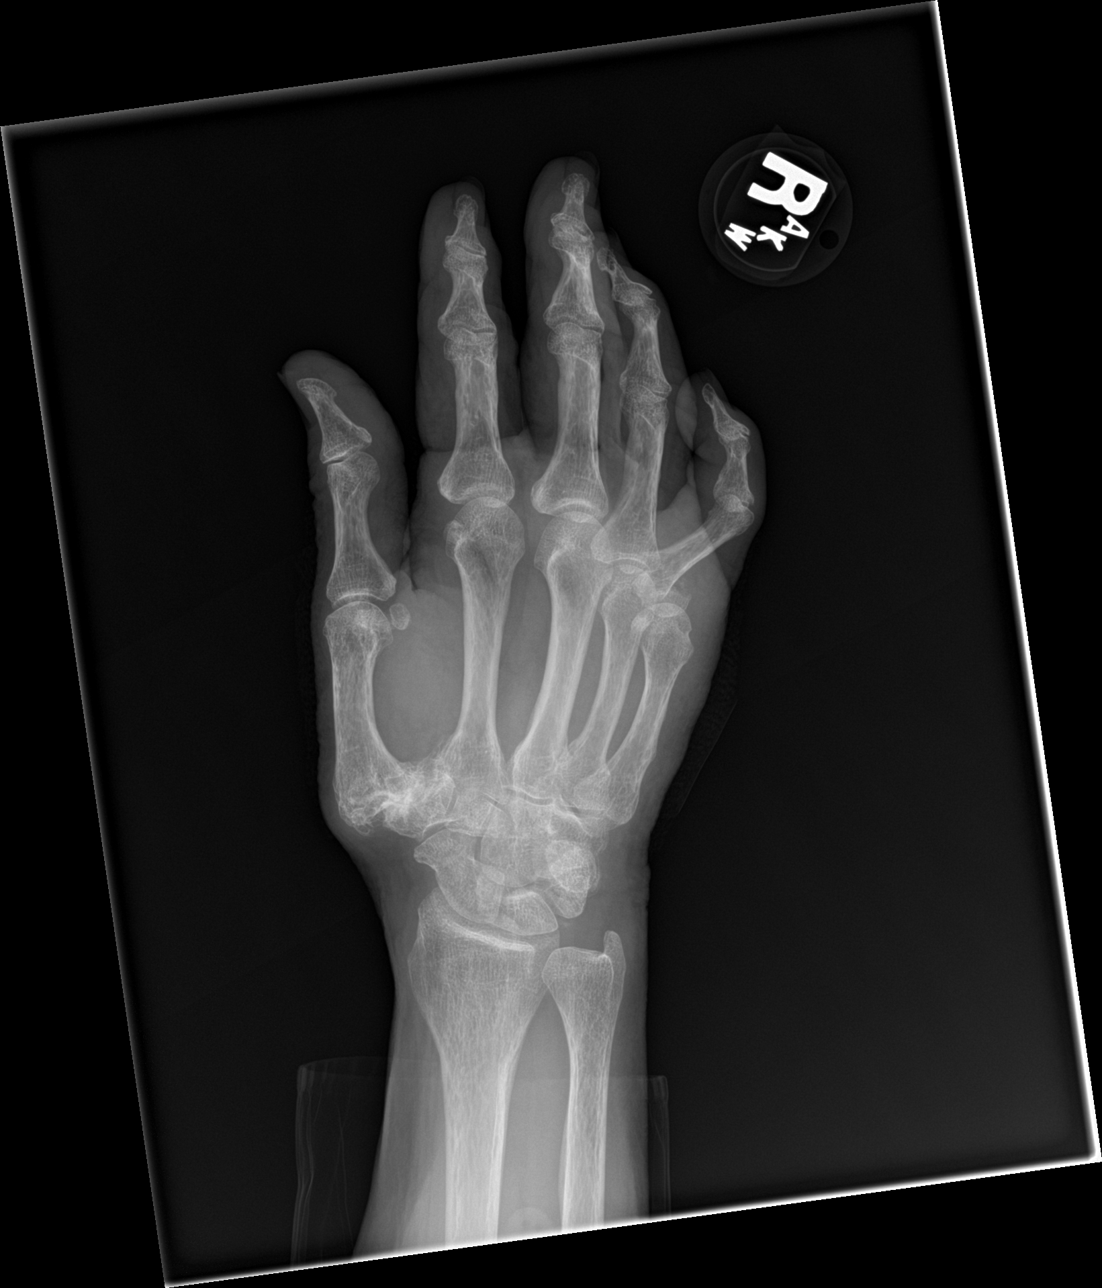

[hand lat (1 of 2)]
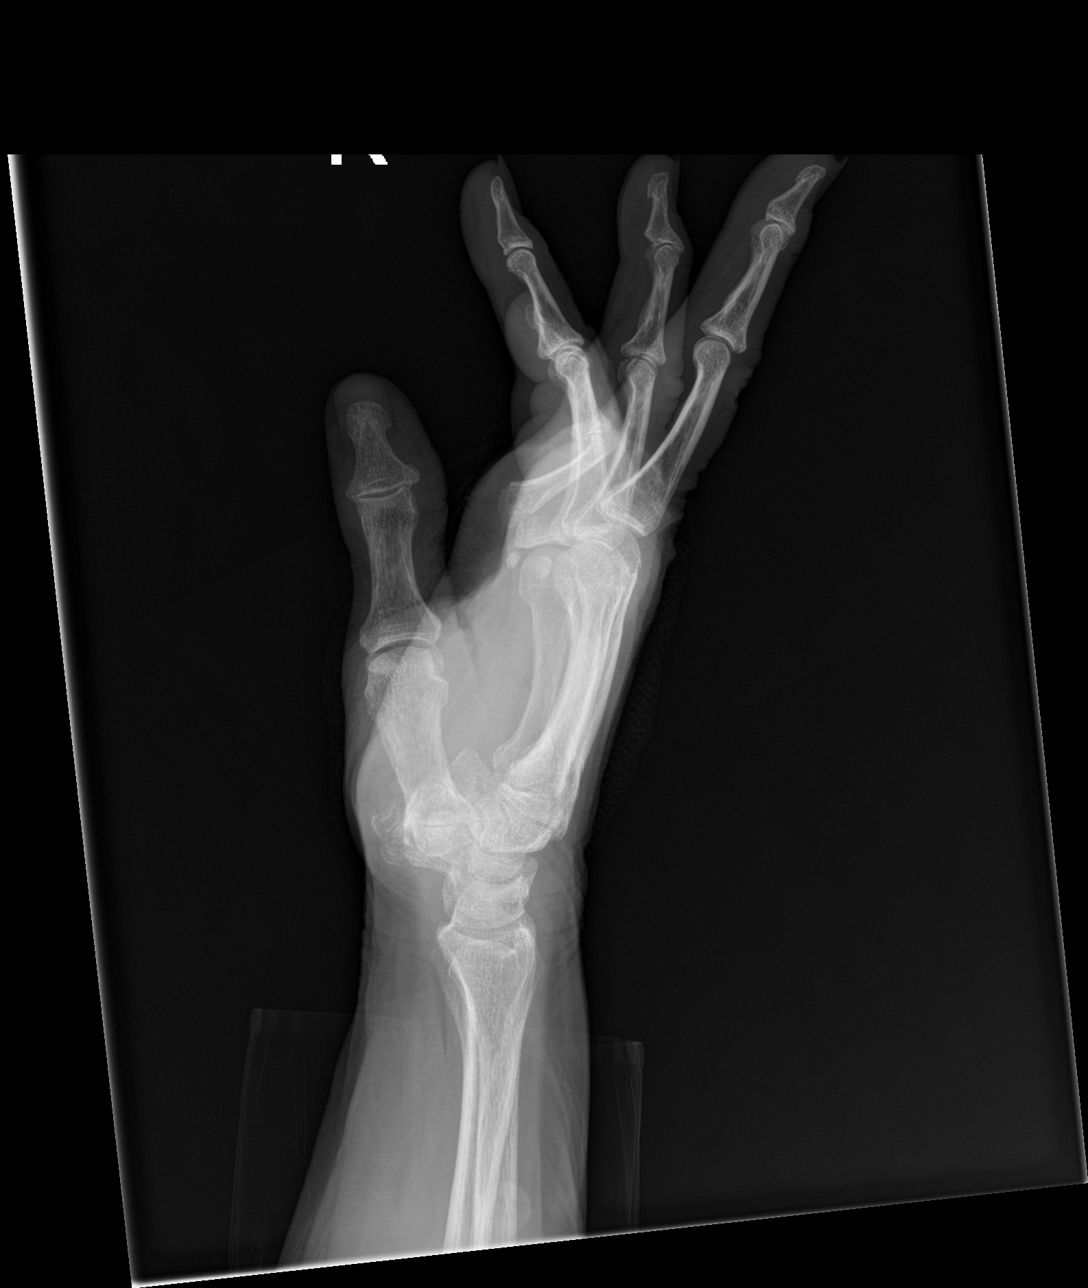

[hand lat (2 of 2)]
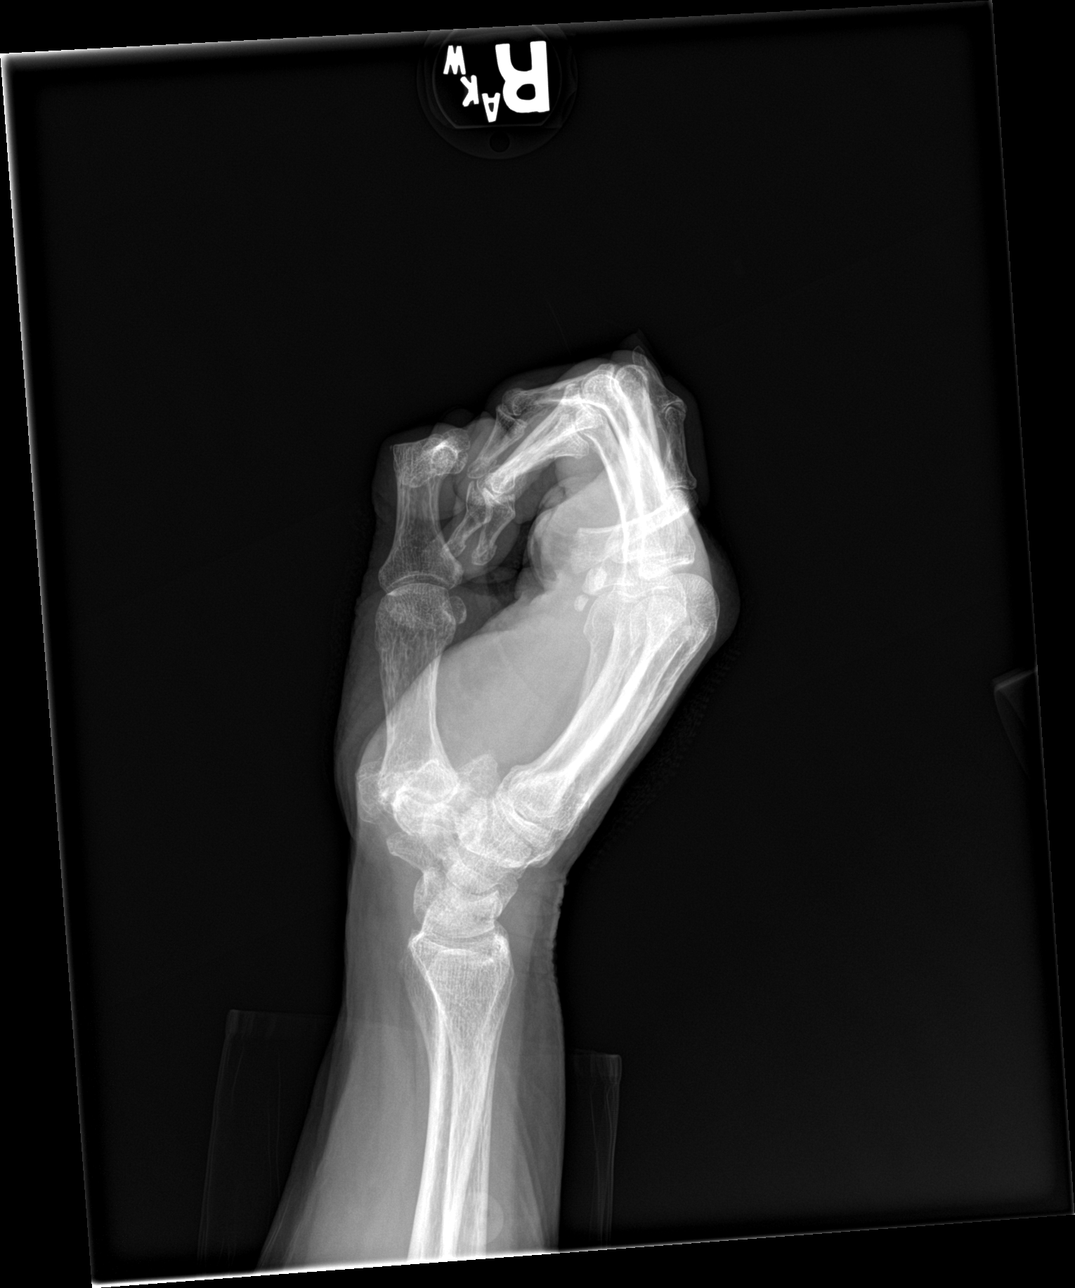

[4 of 4 positions shown; findings below may reference images not displayed]

FINDINGS: Three view radiograph of the right hand demonstrates an acute,
transverse extra-articular fracture of the base of the right fifth
proximal phalanx with 1 shaft with volar displacement and near 90
degrees dorsal angulation of the distal fracture fragment. Extensive
surrounding soft tissue swelling. This is best seen on closed hand
lateral view.

No other fracture or dislocation identified. Advanced degenerative
changes are seen at the first carpometacarpal joint at the base of
the thumb. Remaining joint spaces appear preserved.
IMPRESSION: Extra-articular fracture of the base of the right fifth proximal
phalanx with marked dorsal angulation and volar displacement of the
distal fracture fragment.

## 2021-06-08 DIAGNOSIS — H35371 Puckering of macula, right eye: Secondary | ICD-10-CM | POA: Diagnosis not present

## 2021-06-08 DIAGNOSIS — H524 Presbyopia: Secondary | ICD-10-CM | POA: Diagnosis not present

## 2021-06-08 DIAGNOSIS — Z961 Presence of intraocular lens: Secondary | ICD-10-CM | POA: Diagnosis not present

## 2021-06-21 NOTE — Progress Notes (Signed)
Office Visit Note  Patient: Stacey Coleman             Date of Birth: 10-21-1950           MRN: [id number]             PCP: Antony Contras, MD Referring: Verner Chol, MD Visit Date: 06/22/2021 Occupation: [employer] teacher  Subjective:  New Patient (Initial Visit) (Left ankle pain, low back pain, abnormal labs)   History of Present Illness: Stacey Coleman is a 70 y.o. 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 She has previously seen several orthopedic surgery doctors. Known multilevel spinal disease with some thoracic spine compression fracture and increased kyphosis with several inches height loss. She had past left ankle surgery with Dr. Percell Miller. She was previously seen by Dr. Fredna Dow in orthopedic surgery clinic had 1st dorsal compartment steroid injection also management of 5th proximal phalanx fracture. She has known osteoporosis on treatment with Evista for about 5 years.  Labs reviewed ANA 1:80 cytoplasmic RF neg CCP neg HLA-B27 neg ESR 2 Uric acid 4.1 Vit D 60  Imaging reviewed 10/24/20 Xray right hand FINDINGS: Three view radiograph of the right hand demonstrates an acute, transverse extra-articular fracture of the base of the right fifth proximal phalanx with 1 shaft with volar displacement and near 90 degrees dorsal angulation of the distal fracture fragment. Extensive surrounding soft tissue swelling. This is best seen on closed hand lateral view. No other fracture or dislocation identified. Advanced degenerative changes are seen at the first carpometacarpal joint at the base of the thumb. Remaining joint spaces appear preserved. IMPRESSION: Extra-articular fracture of the base of the right fifth proximal phalanx with marked dorsal angulation and volar displacement of the distal fracture fragment.  08/28/14 Xray right knee IMPRESSION: Satisfactory appearance of the right knee after  total knee replacement.    Activities of Daily Living:  Patient reports morning stiffness for patient unable to answer question.   Patient Reports nocturnal pain.  Difficulty dressing/grooming: Denies Difficulty climbing stairs: Denies Difficulty getting out of chair: Denies Difficulty using hands for taps, buttons, cutlery, and/or writing: Reports  Review of Systems  Constitutional:  Positive for fatigue.  HENT:  Positive for mouth dryness.   Eyes:  Positive for dryness.  Respiratory:  Negative for shortness of breath.   Cardiovascular:  Negative for swelling in legs/feet.  Gastrointestinal:  Positive for diarrhea.  Endocrine: Positive for cold intolerance, excessive thirst and increased urination.  Genitourinary:  Negative for difficulty urinating.  Musculoskeletal:  Positive for joint pain, gait problem, joint pain and joint swelling.  Skin:  Negative for rash.  Allergic/Immunologic: Negative for susceptible to infections.  Neurological:  Positive for numbness and weakness.  Hematological:  Negative for bruising/bleeding tendency.  Psychiatric/Behavioral:  Negative for sleep disturbance.    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   Macular pucker, bilateral 06/22/2021   Menopausal symptom 06/22/2021   Other diseases of larynx 06/22/2021   Other specified disorders of bone density and structure, other site 06/22/2021   Posterior capsular opacification 06/22/2021   Pseudophakia 06/22/2021   Pure hypercholesterolemia 06/22/2021   Positive ANA (antinuclear antibody) 06/22/2021   Generalized osteoarthritis of multiple sites 06/22/2021  De Quervain's tenosynovitis 04/28/2021   Primary osteoarthritis of both first carpometacarpal joints 04/28/2021   Closed displaced fracture of proximal  phalanx of right little finger 10/27/2020   Degenerative cervical disc 04/25/2020   Ischial bursitis, right 01/19/2017   DJD (degenerative joint disease) of knee 08/28/2014   Essential hypertension 08/15/2014   Hyperlipidemia 08/15/2014   Hypothyroidism 08/15/2014   GERD (gastroesophageal reflux disease) 08/15/2014   Allergic rhinitis 08/15/2014   Hypothyroid    Seasonal allergies    Prediabetes    Chondrosarcoma of ribs, sternum, 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    Past Medical History:  Diagnosis Date   Anemia 2013   Arthritis    Bursitis    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   Family history of adverse reaction to anesthesia    mother nausea/vomiting   GERD (gastroesophageal reflux disease)    Hypertension    Hypothyroid    Hypothyroidism    Laryngotracheal cleft    laryngotracheal reflux   Nasal congestion    nasal dripping   Osteoporosis    Prediabetes    Seasonal allergies    Sleep apnea    mild, no CPAP   Wears hearing aid    both ears   Wears partial dentures     Family History  Problem Relation Age of Onset   Breast cancer Mother    Heart attack Father    Hypertension Father    Diabetes Brother    Heart attack Maternal Grandfather    Colon cancer Paternal Grandfather    Diabetes Other    Past Surgical History:  Procedure Laterality Date   ANKLE ARTHROSCOPY  2008   spurs-left   BONE RESECTION  2004   left humerous   CLOSED REDUCTION FINGER WITH PERCUTANEOUS PINNING Right 10/30/2020   Procedure: CLOSED REDUCTION RIGHT SMALL PROXIMAL PHALANX FRACTURE OF  FINGER WITH PERCUTANEOUS PINNING;  Surgeon: [name], MD;  Location: [facility];  Service: Orthopedics;  Laterality: Right;   COLONOSCOPY     KNEE ARTHROSCOPY  1990   rt   KNEE ARTHROSCOPY  2002   rt   KNEE ARTHROSCOPY W/ ACL RECONSTRUCTION  2001   right   KNEE ARTHROSCOPY WITH MEDIAL MENISECTOMY Left 12/13/2013   Procedure: LEFT KNEE ARTHROSCOPY WITH MEDIAL MENISECTOMY, PLICA  EXCISION, CHONDROPLASTY;  Surgeon: Ninetta Lights, MD;  Location: [facility];  Service: Orthopedics;  Laterality: Left;   SHOULDER SURGERY Left 2004   Removed chondro sarcoma   TOTAL KNEE ARTHROPLASTY Right 08/28/2014   Procedure: RIGHT TOTAL KNEE ARTHROPLASTY;  Surgeon: Ninetta Lights, MD;  Location: [facility];  Service: Orthopedics;  Laterality: Right;   Social History   Social History Narrative   Retired Pharmacist, hospital, 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   Immunization History  Administered Date(s) Administered   Influenza Split 05/09/2010   PFIZER(Purple Top)SARS-COV-2 Vaccination 08/14/2019, 09/05/2019   Pneumococcal Conjugate-13 12/06/2014   Pneumococcal Polysaccharide-23 07/08/2016   Tdap 02/24/2008   Zoster, Live 03/09/2011     Objective: Vital Signs: BP 122/74 (BP Location: Left Arm, Patient Position: Sitting, Cuff Size: Normal)   Pulse 81   Resp 16   Ht 4' 11"  (1.499 m)   Wt 142 lb (64.4 kg)   BMI 28.68 kg/m    Physical Exam HENT:     Right Ear: External ear normal.     Left Ear: External ear normal.  Eyes:     Conjunctiva/sclera: Conjunctivae normal.  Cardiovascular:     Rate and Rhythm: Normal rate  and regular rhythm.  Pulmonary:     Effort: Pulmonary effort is normal.     Breath sounds: Normal breath sounds.  Musculoskeletal:     Right lower leg: No edema.     Left lower leg: No edema.  Skin:    General: Skin is warm and dry.     Findings: No rash.     Comments: Normal appearing nailfold capillaroscopy bilaterally  Neurological:     General: No focal deficit present.     Mental Status: She is alert.     Deep Tendon Reflexes: Reflexes normal.  Psychiatric:        Mood and Affect: Mood normal.     Musculoskeletal Exam:  Neck full ROM mild left sided tenderness Shoulders full ROM no tenderness or swelling Elbows full ROM no tenderness or swelling Wrists full ROM no tenderness or swelling Fingers 1st CMC squaring no tenderness or palpable  synovitis Increased thoracic spine kyphosis no point tenderness over spine Knees full ROM crepitus and bony changes no palpable effusions Left ankle crepitus with inversion and eversion ROM, bony nodule at dorsal midfoot joint minimal tenderness no palpable effusions or warmth    Investigation: No additional findings.  Imaging: No results found.  Recent Labs: Lab Results  Component Value Date   WBC 7.7 08/31/2014   HGB 9.5 (L) 08/31/2014   PLT 257 08/31/2014   NA 137 10/30/2020   K 4.0 10/30/2020   CL 106 10/30/2020   CO2 22 10/30/2020   GLUCOSE 103 (H) 10/30/2020   BUN 8 10/30/2020   CREATININE 0.68 10/30/2020   BILITOT 0.4 08/20/2014   ALKPHOS 69 08/20/2014   AST 21 08/20/2014   ALT 16 08/20/2014   PROT 7.3 08/20/2014   ALBUMIN 3.7 08/20/2014   CALCIUM 9.1 10/30/2020   GFRAA 92 10/31/2017    Speciality Comments: No specialty comments available.  Procedures:  No procedures performed Allergies: Macrolides and ketolides and Other   Assessment / Plan:     Visit Diagnoses: Positive ANA (antinuclear antibody) - Plan: Anti-Smith antibody, Sjogrens syndrome-A extractable nuclear antibody, Anti-DNA antibody, double-stranded, C3 and C4  Joint pain in multiple areas and positive ANA but otherwise no specific clinical criteria on exam or history today.  We will check specific autoantibodies and serum complement today.   Generalized osteoarthritis of multiple sites  Certainly there are chronic degenerative arthritis changes at multiple sites particularly has exaggerated thoracic kyphosis with height loss with known osteoporosis and compression fractures as well.  She is well-established in orthopedics clinic already for these issues.  Reviewed supplements, topical medicines, oral medicines, or procedural interventions for osteoarthritis symptom management.  Orders: Orders Placed This Encounter  Procedures   Anti-Smith antibody   Sjogrens syndrome-A extractable nuclear  antibody   Anti-DNA antibody, double-stranded   C3 and C4    No orders of the defined types were placed in this encounter.    Follow-Up Instructions: No follow-ups on file.   Collier Salina, MD  Note - This record has been created using [employer].  Chart creation errors have been sought, but may not always  have been located. Such creation errors do not reflect on  the standard of medical care.

## 2021-06-22 ENCOUNTER — Other Ambulatory Visit: Payer: Self-pay

## 2021-06-22 ENCOUNTER — Ambulatory Visit: Payer: Medicare PPO | Admitting: Internal Medicine

## 2021-06-22 ENCOUNTER — Encounter: Payer: Self-pay | Admitting: Internal Medicine

## 2021-06-22 VITALS — BP 122/74 | HR 81 | Resp 16 | Ht 59.0 in | Wt 142.0 lb

## 2021-06-22 DIAGNOSIS — N951 Menopausal and female climacteric states: Secondary | ICD-10-CM | POA: Insufficient documentation

## 2021-06-22 DIAGNOSIS — F4320 Adjustment disorder, unspecified: Secondary | ICD-10-CM | POA: Insufficient documentation

## 2021-06-22 DIAGNOSIS — E7800 Pure hypercholesterolemia, unspecified: Secondary | ICD-10-CM | POA: Insufficient documentation

## 2021-06-22 DIAGNOSIS — H35349 Macular cyst, hole, or pseudohole, unspecified eye: Secondary | ICD-10-CM | POA: Insufficient documentation

## 2021-06-22 DIAGNOSIS — M159 Polyosteoarthritis, unspecified: Secondary | ICD-10-CM | POA: Diagnosis not present

## 2021-06-22 DIAGNOSIS — R768 Other specified abnormal immunological findings in serum: Secondary | ICD-10-CM

## 2021-06-22 DIAGNOSIS — M8588 Other specified disorders of bone density and structure, other site: Secondary | ICD-10-CM | POA: Insufficient documentation

## 2021-06-22 DIAGNOSIS — G4700 Insomnia, unspecified: Secondary | ICD-10-CM | POA: Insufficient documentation

## 2021-06-22 DIAGNOSIS — H26499 Other secondary cataract, unspecified eye: Secondary | ICD-10-CM | POA: Insufficient documentation

## 2021-06-22 DIAGNOSIS — D649 Anemia, unspecified: Secondary | ICD-10-CM | POA: Insufficient documentation

## 2021-06-22 DIAGNOSIS — Z961 Presence of intraocular lens: Secondary | ICD-10-CM | POA: Insufficient documentation

## 2021-06-22 DIAGNOSIS — H35373 Puckering of macula, bilateral: Secondary | ICD-10-CM | POA: Insufficient documentation

## 2021-06-22 DIAGNOSIS — J387 Other diseases of larynx: Secondary | ICD-10-CM | POA: Insufficient documentation

## 2021-06-22 DIAGNOSIS — IMO0002 Reserved for concepts with insufficient information to code with codable children: Secondary | ICD-10-CM | POA: Insufficient documentation

## 2021-06-22 NOTE — Patient Instructions (Signed)
For osteoarthritis several treatments may be beneficial: - Topical antiinflammatory medicine such as diclofenac or Voltaren can be applied to  affected area as needed but may be less effective than oral antiinflammatory medicine. Topical analgesics containing CBD, menthol, or lidocaine can be tried. Capsaicin containing treatments are recommended against for the hand.  - Oral nonsteroidal antiinflammatory medication such as ibuprofen, aleve, celebrex, or mobic are very helpful for osteoarthritis but can cause side effects such as stomach ulcers or hypertension with prolonged use. These should be taken intermittently or as needed, and always taken with food.  - Other oral supplements such as glucosamine chondroitin containing OTC treatments such as osteo bi-flex or other brands do not have strong data supporting effectiveness but can be helpful for some individuals and have no major side effects. Turmeric has some antiinflammatory effect similar to NSAID medications and may help, if taken as a supplement should not be taken above recommended doses.  - Compressive gloves can be helpful to support the thumb joint especially if hurting with certain activities.  - Occupational therapy referral can discuss exercises or activity modification to improve symptoms or strength if needed.  - Local steroid injection is an option if symptoms become worse and not controlled by the above options.

## 2021-06-23 LAB — ANTI-DNA ANTIBODY, DOUBLE-STRANDED: ds DNA Ab: <1 IU/mL

## 2021-06-23 LAB — SJOGRENS SYNDROME-A EXTRACTABLE NUCLEAR ANTIBODY: SSA (Ro) (ENA) Antibody, IgG: <1 AI

## 2021-06-23 LAB — ANTI-SMITH ANTIBODY: ENA SM Ab Ser-aCnc: <1 AI

## 2021-06-23 LAB — C3 AND C4
C3 Complement: 131 mg/dL (ref 83–193)
C4 Complement: 29 mg/dL (ref 15–57)

## 2021-07-07 NOTE — Progress Notes (Signed)
Lab tests for more specific antibodies were all negative so I do not see evidence of lupus or rheumatoid arthritis problems. I think her current symptoms are mostly osteoarthritis with existing damage to multiple joints, no specific follow up is needed with our office she has existing orthopedics team.

## 2021-08-18 DIAGNOSIS — H938X2 Other specified disorders of left ear: Secondary | ICD-10-CM | POA: Diagnosis not present

## 2021-08-18 DIAGNOSIS — H9193 Unspecified hearing loss, bilateral: Secondary | ICD-10-CM | POA: Diagnosis not present

## 2021-09-24 DIAGNOSIS — Z1231 Encounter for screening mammogram for malignant neoplasm of breast: Secondary | ICD-10-CM | POA: Diagnosis not present

## 2021-10-09 DIAGNOSIS — R7303 Prediabetes: Secondary | ICD-10-CM | POA: Diagnosis not present

## 2021-10-09 DIAGNOSIS — I10 Essential (primary) hypertension: Secondary | ICD-10-CM | POA: Diagnosis not present

## 2021-10-09 DIAGNOSIS — Z0000 Encounter for general adult medical examination without abnormal findings: Secondary | ICD-10-CM | POA: Diagnosis not present

## 2021-10-09 DIAGNOSIS — G4700 Insomnia, unspecified: Secondary | ICD-10-CM | POA: Diagnosis not present

## 2021-10-09 DIAGNOSIS — M85851 Other specified disorders of bone density and structure, right thigh: Secondary | ICD-10-CM | POA: Diagnosis not present

## 2021-10-09 DIAGNOSIS — M545 Low back pain, unspecified: Secondary | ICD-10-CM | POA: Diagnosis not present

## 2021-10-09 DIAGNOSIS — E7800 Pure hypercholesterolemia, unspecified: Secondary | ICD-10-CM | POA: Diagnosis not present

## 2021-10-09 DIAGNOSIS — Z1389 Encounter for screening for other disorder: Secondary | ICD-10-CM | POA: Diagnosis not present

## 2021-10-09 DIAGNOSIS — J387 Other diseases of larynx: Secondary | ICD-10-CM | POA: Diagnosis not present

## 2021-10-09 DIAGNOSIS — E039 Hypothyroidism, unspecified: Secondary | ICD-10-CM | POA: Diagnosis not present

## 2021-10-09 DIAGNOSIS — R1319 Other dysphagia: Secondary | ICD-10-CM | POA: Diagnosis not present

## 2021-10-20 DIAGNOSIS — H35362 Drusen (degenerative) of macula, left eye: Secondary | ICD-10-CM | POA: Diagnosis not present

## 2021-10-20 DIAGNOSIS — H35373 Puckering of macula, bilateral: Secondary | ICD-10-CM | POA: Diagnosis not present

## 2021-10-20 DIAGNOSIS — H442E3 Degenerative myopia with other maculopathy, bilateral eye: Secondary | ICD-10-CM | POA: Diagnosis not present

## 2021-10-20 DIAGNOSIS — H43813 Vitreous degeneration, bilateral: Secondary | ICD-10-CM | POA: Diagnosis not present

## 2021-10-29 DIAGNOSIS — M8589 Other specified disorders of bone density and structure, multiple sites: Secondary | ICD-10-CM | POA: Diagnosis not present

## 2021-12-15 DIAGNOSIS — M810 Age-related osteoporosis without current pathological fracture: Secondary | ICD-10-CM | POA: Diagnosis not present

## 2021-12-15 DIAGNOSIS — M25572 Pain in left ankle and joints of left foot: Secondary | ICD-10-CM | POA: Diagnosis not present

## 2021-12-15 DIAGNOSIS — M47816 Spondylosis without myelopathy or radiculopathy, lumbar region: Secondary | ICD-10-CM | POA: Diagnosis not present

## 2021-12-15 DIAGNOSIS — S300XXA Contusion of lower back and pelvis, initial encounter: Secondary | ICD-10-CM | POA: Diagnosis not present

## 2022-01-07 DIAGNOSIS — Z01419 Encounter for gynecological examination (general) (routine) without abnormal findings: Secondary | ICD-10-CM | POA: Diagnosis not present

## 2022-01-27 DIAGNOSIS — M810 Age-related osteoporosis without current pathological fracture: Secondary | ICD-10-CM | POA: Diagnosis not present

## 2022-01-27 DIAGNOSIS — E559 Vitamin D deficiency, unspecified: Secondary | ICD-10-CM | POA: Diagnosis not present

## 2022-02-16 DIAGNOSIS — S92345A Nondisplaced fracture of fourth metatarsal bone, left foot, initial encounter for closed fracture: Secondary | ICD-10-CM | POA: Diagnosis not present

## 2022-02-17 DIAGNOSIS — R1310 Dysphagia, unspecified: Secondary | ICD-10-CM | POA: Diagnosis not present

## 2022-02-17 DIAGNOSIS — M542 Cervicalgia: Secondary | ICD-10-CM | POA: Diagnosis not present

## 2022-02-17 DIAGNOSIS — K219 Gastro-esophageal reflux disease without esophagitis: Secondary | ICD-10-CM | POA: Diagnosis not present

## 2022-02-17 DIAGNOSIS — Z86010 Personal history of colonic polyps: Secondary | ICD-10-CM | POA: Diagnosis not present

## 2022-02-17 DIAGNOSIS — K5790 Diverticulosis of intestine, part unspecified, without perforation or abscess without bleeding: Secondary | ICD-10-CM | POA: Diagnosis not present

## 2022-02-17 DIAGNOSIS — K449 Diaphragmatic hernia without obstruction or gangrene: Secondary | ICD-10-CM | POA: Diagnosis not present

## 2022-02-17 DIAGNOSIS — K648 Other hemorrhoids: Secondary | ICD-10-CM | POA: Diagnosis not present

## 2022-02-23 DIAGNOSIS — U071 COVID-19: Secondary | ICD-10-CM

## 2022-02-23 HISTORY — DX: COVID-19: U07.1

## 2022-02-28 DIAGNOSIS — R112 Nausea with vomiting, unspecified: Secondary | ICD-10-CM | POA: Diagnosis not present

## 2022-02-28 DIAGNOSIS — J069 Acute upper respiratory infection, unspecified: Secondary | ICD-10-CM | POA: Diagnosis not present

## 2022-02-28 DIAGNOSIS — U071 COVID-19: Secondary | ICD-10-CM | POA: Diagnosis not present

## 2022-02-28 DIAGNOSIS — Z20822 Contact with and (suspected) exposure to covid-19: Secondary | ICD-10-CM | POA: Diagnosis not present

## 2022-03-09 DIAGNOSIS — R059 Cough, unspecified: Secondary | ICD-10-CM | POA: Diagnosis not present

## 2022-03-09 DIAGNOSIS — M40209 Unspecified kyphosis, site unspecified: Secondary | ICD-10-CM | POA: Diagnosis not present

## 2022-03-09 DIAGNOSIS — R6889 Other general symptoms and signs: Secondary | ICD-10-CM | POA: Diagnosis not present

## 2022-03-09 DIAGNOSIS — R051 Acute cough: Secondary | ICD-10-CM | POA: Diagnosis not present

## 2022-03-09 DIAGNOSIS — J988 Other specified respiratory disorders: Secondary | ICD-10-CM | POA: Diagnosis not present

## 2022-04-15 DIAGNOSIS — R1319 Other dysphagia: Secondary | ICD-10-CM | POA: Diagnosis not present

## 2022-04-15 DIAGNOSIS — J387 Other diseases of larynx: Secondary | ICD-10-CM | POA: Diagnosis not present

## 2022-04-15 DIAGNOSIS — M85851 Other specified disorders of bone density and structure, right thigh: Secondary | ICD-10-CM | POA: Diagnosis not present

## 2022-04-15 DIAGNOSIS — R7303 Prediabetes: Secondary | ICD-10-CM | POA: Diagnosis not present

## 2022-04-15 DIAGNOSIS — M545 Low back pain, unspecified: Secondary | ICD-10-CM | POA: Diagnosis not present

## 2022-04-15 DIAGNOSIS — E039 Hypothyroidism, unspecified: Secondary | ICD-10-CM | POA: Diagnosis not present

## 2022-04-15 DIAGNOSIS — I10 Essential (primary) hypertension: Secondary | ICD-10-CM | POA: Diagnosis not present

## 2022-04-15 DIAGNOSIS — G4700 Insomnia, unspecified: Secondary | ICD-10-CM | POA: Diagnosis not present

## 2022-04-15 DIAGNOSIS — E7800 Pure hypercholesterolemia, unspecified: Secondary | ICD-10-CM | POA: Diagnosis not present

## 2022-06-03 DIAGNOSIS — R1310 Dysphagia, unspecified: Secondary | ICD-10-CM | POA: Diagnosis not present

## 2022-06-03 DIAGNOSIS — K219 Gastro-esophageal reflux disease without esophagitis: Secondary | ICD-10-CM | POA: Diagnosis not present

## 2022-06-03 DIAGNOSIS — K222 Esophageal obstruction: Secondary | ICD-10-CM | POA: Diagnosis not present

## 2022-06-03 DIAGNOSIS — K449 Diaphragmatic hernia without obstruction or gangrene: Secondary | ICD-10-CM | POA: Diagnosis not present

## 2022-06-09 DIAGNOSIS — D225 Melanocytic nevi of trunk: Secondary | ICD-10-CM | POA: Diagnosis not present

## 2022-06-09 DIAGNOSIS — L821 Other seborrheic keratosis: Secondary | ICD-10-CM | POA: Diagnosis not present

## 2022-06-09 DIAGNOSIS — L570 Actinic keratosis: Secondary | ICD-10-CM | POA: Diagnosis not present

## 2022-06-09 DIAGNOSIS — L578 Other skin changes due to chronic exposure to nonionizing radiation: Secondary | ICD-10-CM | POA: Diagnosis not present

## 2022-06-09 DIAGNOSIS — L820 Inflamed seborrheic keratosis: Secondary | ICD-10-CM | POA: Diagnosis not present

## 2022-06-09 DIAGNOSIS — L814 Other melanin hyperpigmentation: Secondary | ICD-10-CM | POA: Diagnosis not present

## 2022-06-10 DIAGNOSIS — H04123 Dry eye syndrome of bilateral lacrimal glands: Secondary | ICD-10-CM | POA: Diagnosis not present

## 2022-06-10 DIAGNOSIS — H524 Presbyopia: Secondary | ICD-10-CM | POA: Diagnosis not present

## 2022-06-10 DIAGNOSIS — H35372 Puckering of macula, left eye: Secondary | ICD-10-CM | POA: Diagnosis not present

## 2022-08-04 ENCOUNTER — Ambulatory Visit: Payer: Medicare PPO | Admitting: Obstetrics and Gynecology

## 2022-08-04 ENCOUNTER — Encounter: Payer: Self-pay | Admitting: Obstetrics and Gynecology

## 2022-08-04 VITALS — BP 130/84 | HR 105 | Ht <= 58 in | Wt 136.0 lb

## 2022-08-04 DIAGNOSIS — N393 Stress incontinence (female) (male): Secondary | ICD-10-CM

## 2022-08-04 DIAGNOSIS — R350 Frequency of micturition: Secondary | ICD-10-CM | POA: Diagnosis not present

## 2022-08-04 DIAGNOSIS — R159 Full incontinence of feces: Secondary | ICD-10-CM | POA: Diagnosis not present

## 2022-08-04 DIAGNOSIS — N8110 Cystocele, unspecified: Secondary | ICD-10-CM | POA: Diagnosis not present

## 2022-08-04 DIAGNOSIS — N812 Incomplete uterovaginal prolapse: Secondary | ICD-10-CM | POA: Diagnosis not present

## 2022-08-04 DIAGNOSIS — R339 Retention of urine, unspecified: Secondary | ICD-10-CM

## 2022-08-04 DIAGNOSIS — N3941 Urge incontinence: Secondary | ICD-10-CM

## 2022-08-04 LAB — POCT URINALYSIS DIPSTICK
Bilirubin, UA: NEGATIVE
Blood, UA: NEGATIVE
Glucose, UA: NEGATIVE
Ketones, UA: NEGATIVE
Leukocytes, UA: NEGATIVE
Nitrite, UA: NEGATIVE
Protein, UA: NEGATIVE
Spec Grav, UA: 1.015 (ref 1.010–1.025)
Urobilinogen, UA: 0.2 E.U./dL
pH, UA: 7 (ref 5.0–8.0)

## 2022-08-04 NOTE — Progress Notes (Signed)
[facility] New Patient Evaluation and Consultation  Referring Provider: Vania Rea, MD PCP: Antony Contras, MD Date of Service: 08/04/2022  SUBJECTIVE Chief Complaint: New Patient (Initial Visit) Stacey Coleman is a 72 y.o. female her for a prolapse consult.)  History of Present Illness: Stacey Coleman is a 72 y.o. White or Caucasian female seen in consultation at the request of Dr. Stann Mainland for evaluation of prolapse.    Review of records from Dr Stann Mainland significant for: Has OAB and has been on oxybutynin (5 and '10mg'$  ER). Cystocele and uterine prolapse noted.   Urinary Symptoms: Leaks urine with cough/ sneeze and with urgency Leaks 0-3 time(s) per day.  Pad use: none She is bothered by her UI symptoms. She is on oxybutynin 10 ER.   Day time voids- about every two hours (more often before the oxybutynin).  Nocturia: 1-3 times per night to void. Voiding dysfunction: she empties her bladder well.  does not use a catheter to empty bladder.  When urinating, she feels a weak stream Drinks: 1-2 cups coffee in AM, 24oz+ water, 1 carbonated water, occasional sweet tea or wine cooler  UTIs:  0  UTI's in the last year.   Denies history of blood in urine and kidney or bladder stones  Pelvic Organ Prolapse Symptoms:                  She Admits to a feeling of a bulge the vaginal area. It has been present for 1 year. Has been worsening recently.  She Admits to seeing a bulge.  This bulge is bothersome. Has been using vagifem pills twice a week.   Bowel Symptom: Bowel movements: 1-2 time(s) per day. Has IBS Stool consistency: soft  Straining: no.  Splinting: no.  Incomplete evacuation: yes.  She Admits to accidental bowel leakage / fecal incontinence  Occurs: 0-3 time(s) per month- depends on diet, has specific triggers  Consistency with leakage: soft  Bowel regimen: diet and pepogest (peppermint oil)  Sexual Function Sexually active: no.   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   Family history of adverse reaction to anesthesia    mother nausea/vomiting   GERD (gastroesophageal reflux disease)    Hypertension    Hypothyroid    Hypothyroidism    Laryngotracheal cleft    laryngotracheal reflux   Nasal congestion    nasal dripping   Osteoporosis    Prediabetes    Seasonal allergies    Sleep apnea    mild, no CPAP   Wears hearing aid    both ears   Wears partial dentures      Past Surgical History:   Past Surgical History:  Procedure Laterality Date   ANKLE ARTHROSCOPY  2008   spurs-left   BONE RESECTION  2004   left humerous   CLOSED REDUCTION FINGER WITH PERCUTANEOUS PINNING Right 10/30/2020   Procedure: CLOSED REDUCTION RIGHT SMALL PROXIMAL PHALANX FRACTURE OF  FINGER WITH PERCUTANEOUS PINNING;  Surgeon: [name], MD;  Location: [facility];  Service: Orthopedics;  Laterality: Right;   COLONOSCOPY     KNEE ARTHROSCOPY  1990   rt   KNEE ARTHROSCOPY  2002   rt   KNEE ARTHROSCOPY W/ ACL RECONSTRUCTION  2001   right   KNEE ARTHROSCOPY WITH MEDIAL  MENISECTOMY Left 12/13/2013   Procedure: LEFT KNEE ARTHROSCOPY WITH MEDIAL MENISECTOMY, PLICA EXCISION, CHONDROPLASTY;  Surgeon: Ninetta Lights, MD;  Location: [facility];  Service: Orthopedics;  Laterality: Left;   SHOULDER SURGERY Left 2004   Removed chondro sarcoma   TOTAL KNEE ARTHROPLASTY Right 08/28/2014   Procedure: RIGHT TOTAL KNEE ARTHROPLASTY;  Surgeon: Ninetta Lights, MD;  Location: [facility];  Service: Orthopedics;  Laterality: Right;     Past OB/GYN History: OB History  Gravida Para Term Preterm AB Living  '2 2 2     2  '$ SAB IAB Ectopic Multiple Live Births          2    # Outcome Date GA Lbr Len/2nd Weight Sex Delivery Anes PTL Lv  2 Term      Vag-Spont     1 Term      Vag-Spont        Menopausal: Yes, Denies vaginal bleeding since menopause Last pap smear was 2023- negative.  Any history of abnormal pap smears: no.   Medications: She has a current medication list which includes the following prescription(s): amlodipine, amlodipine, benazepril-hydrochlorthiazide, calcium carbonate, calcium gluconate, vitamin d3, diclofenac sodium, fexofenadine, ibuprofen-diphenhydramine cit, lactobacillus, levothyroxine, methocarbamol, nabumetone, omega-3, omeprazole, omeprazole, oxybutynin, systane balance, red yeast rice, and triamcinolone.   Allergies: Patient is allergic to macrolides and ketolides and other.   Social History:  Social History   Tobacco Use   Smoking status: Never   Smokeless tobacco: Never  Vaping Use   Vaping Use: Never used  Substance Use Topics   Alcohol use: Yes    Comment: occ   Drug use: No    Relationship status: widowed She lives with son.   She is employed as a Investment banker, corporate. Regular exercise: Yes: water aerobics/ strength History of abuse: No  Family History:   Family History  Problem Relation Age of Onset   Breast cancer Mother    Heart attack Father    Hypertension Father    Diabetes Brother    Heart attack Maternal Grandfather    Colon cancer Paternal Grandfather    Diabetes Other      Review of Systems: Review of Systems  Constitutional:  Negative for fever, malaise/fatigue and weight loss.  Respiratory:  Negative for cough, shortness of breath and wheezing.   Cardiovascular:  Negative for chest pain, palpitations and leg swelling.  Gastrointestinal:  Negative for abdominal pain and blood in stool.  Genitourinary:  Negative for dysuria.  Musculoskeletal:  Positive for myalgias.  Skin:  Negative for rash.  Neurological:  Negative for dizziness and headaches.  Endo/Heme/Allergies:  Bruises/bleeds easily.  Psychiatric/Behavioral:  Negative for depression. The patient is nervous/anxious.      OBJECTIVE Physical  Exam: Vitals:   08/04/22 1403  BP: 130/84  Pulse: (!) 105  Weight: 136 lb (61.7 kg)  Height: '4\' 10"'$  (1.473 m)    Physical Exam Constitutional:      General: She is not in acute distress. Pulmonary:     Effort: Pulmonary effort is normal.  Abdominal:     General: There is no distension.     Palpations: Abdomen is soft.     Tenderness: There is no abdominal tenderness. There is no rebound.  Musculoskeletal:        General: No swelling. Normal range of motion.  Skin:    General: Skin is warm and dry.     Findings: No rash.  Neurological:     Mental Status: She  is alert and oriented to person, place, and time.  Psychiatric:        Mood and Affect: Mood normal.        Behavior: Behavior normal.      GU / Detailed Urogynecologic Evaluation:  Pelvic Exam: Normal external female genitalia; Bartholin's and Skene's glands normal in appearance; urethral meatus normal in appearance, no urethral masses or discharge.   CST: negative  Speculum exam reveals normal vaginal mucosa with atrophy. Cervix normal appearance. Uterus normal single, nontender. Adnexa no mass, fullness, tenderness.     Pelvic floor strength I/V  Pelvic floor musculature: Right levator non-tender, Right obturator non-tender, Left levator non-tender, Left obturator non-tender  POP-Q:   POP-Q  0.5                                            Aa   0.5                                           Ba  -4                                              C   3                                            Gh  2.5                                            Pb  7                                            tvl   -2.5                                            Ap  -2.5                                            Bp  -5.5                                              D      Rectal Exam:  Normal sphincter tone  Post-Void Residual (PVR) by Bladder Scan: In order to evaluate bladder emptying, we discussed  obtaining a postvoid residual and she agreed to this procedure.  Procedure: The ultrasound unit was placed on the patient's abdomen in the suprapubic region after the patient had voided. A PVR  of 304 ml was obtained by bladder scan. Straight catheterization was performed to drain bladder and PVR of 378m confirmed.   Laboratory Results: POC urine: negative   ASSESSMENT AND PLAN Ms. WSatchellis a 72y.o. with:  1. Prolapse of anterior vaginal wall   2. Uterovaginal prolapse, incomplete   3. Incomplete bladder emptying   4. Urge incontinence   5. Urinary frequency   6. SUI (stress urinary incontinence, female)    Stage II anterior, Stage I posterior, Stage I apical prolapse - For treatment of pelvic organ prolapse, we discussed options for management including expectant management, conservative management, and surgical management, such as Kegels, a pessary, pelvic floor physical therapy, and specific surgical procedures. - She is interested in surgery. We discussed two options for prolapse repair:  1) vaginal repair without mesh - Pros - safer, no mesh complications - Cons - not as strong as mesh repair, higher risk of recurrence -can be done with or without hysterectomy  2) laparoscopic repair with mesh - Pros - stronger, better long-term success - Cons - risks of mesh implant (erosion into vagina or bladder, adhering to the rectum, pain) - these risks are lower than with a vaginal mesh but still exist - Handouts provided on options for her to consider  2. Incomplete bladder emptying - Unclear if this is due to prolapse or a side effect of oxybutynin.  - Advised to discontinue oxybutynin and will plan to evaluate further with urodynamic testing.   3. Urge incontinence - likely exacerbated by incomplete emptying. Stop oxybutynin.   4. SUI - will evaluate further with Urodynamics - Discussed surgical options of sling vs urethral bulking  5. Accidental Bowel Leakage:  - Treatment  options include anti-diarrhea medication (loperamide/ Imodium OTC or prescription lomotil), fiber supplements, physical therapy, and possible sacral neuromodulation or surgery.   - She feels this is related to dietary triggers and IBS.  - Discussed adding psyllium fiber supplement to help with stool bulking  Return for urodynamics  MJaquita Folds MD

## 2022-08-04 NOTE — Patient Instructions (Addendum)
Today we talked about ways to manage bladder urgency such as altering your diet to avoid irritative beverages and foods (bladder diet) as well as attempting to decrease stress and other exacerbating factors.    The Most Bothersome Foods* The Least Bothersome Foods*  Coffee - Regular & Decaf Tea - caffeinated Carbonated beverages - cola, non-colas, diet & caffeine-free Alcohols - Beer, Red Wine, White Wine, Champagne Fruits - Grapefruit, [place], Orange, [employer] - Cranberry, Grapefruit, Orange, Pineapple Vegetables - Tomato & Tomato Products Flavor Enhancers - Hot peppers, Spicy foods, Chili, Horseradish, Vinegar, Monosodium glutamate (MSG) Artificial Sweeteners - NutraSweet, Sweet 'N Low, Equal (sweetener), Saccharin Ethnic foods - [place], [place], [place] food [employer] - low-fat & whole Fruits - Bananas, Blueberries, Honeydew melon, Pears, Raisins, Watermelon Vegetables - Broccoli, Brussels Sprouts, [place], Carrots, Cauliflower, [place], Cucumber, Mushrooms, Peas, Radishes, Squash, Zucchini, White potatoes, Sweet potatoes & yams Poultry - Chicken, Eggs, [place], [employer] - Beef, Programmer, multimedia, Lamb Seafood - Shrimp, [place] fish, Salmon Grains - Oat, Rice Snacks - Pretzels, Popcorn  *Lissa Morales et al. Diet and its role in interstitial cystitis/bladder pain syndrome (IC/BPS) and comorbid conditions. [place] 2012 Jan 11.   Accidental Bowel Leakage: Our goal is to achieve formed bowel movements daily or every-other-day without leakage.  You may need to try different combinations of the following options to find what works best for you.  Some management options include: Dietary changes (more leafy greens, vegetables and fruits; less processed foods) Fiber supplementation (Metamucil or something with psyllium as active ingredient) Over-the-counter imodium (tablets or liquid) to help solidify the stool and prevent leakage of stool.  You have a stage 2 (out of 4) prolapse.   We discussed the fact that it is not life threatening but there are several treatment options. For treatment of pelvic organ prolapse, we discussed options for management including expectant management, conservative management, and surgical management, such as Kegels, a pessary, pelvic floor physical therapy, and specific surgical procedures.     URODYNAMICS (UDS) TEST INFORMATION  IMPORTANT: Please try to arrive with a comfortably full bladder!    What is UDS? Urodynamics is a bladder test used to evaluate how your bladder and urethra (tube you urinate out of) work to help find out the cause of your bladder symptoms and evaluate your bladder function in order to make the best treatment plan for you.   What to expect? A nurse will perform the test and will be with you during the entire exam. First we will have to empty your bladder on a special toilet.  After you have emptied your bladder, very small catheters (plastic tubing) will be placed into your bladder and into your vagina (or rectum). These special small catheters measure pressure to help measure your bladder function.  Your bladder will be gently filled with water and you will be asked to cough and strain at several different points during the test.   You will then be asked to empty your bladder in the special toilet with the catheters in place. Most patients can urinate (pee) easily with the catheters in place since the catheters are so small. In total this procedure lasts about 45 minutes to 1 hour.  After your test is completed, you will return (or possibly be seen the same day) to review the results, talk about treatment options and make a plan moving forward.

## 2022-08-11 DIAGNOSIS — K222 Esophageal obstruction: Secondary | ICD-10-CM | POA: Diagnosis not present

## 2022-08-11 DIAGNOSIS — K5730 Diverticulosis of large intestine without perforation or abscess without bleeding: Secondary | ICD-10-CM | POA: Diagnosis not present

## 2022-08-11 DIAGNOSIS — M25572 Pain in left ankle and joints of left foot: Secondary | ICD-10-CM | POA: Diagnosis not present

## 2022-08-11 DIAGNOSIS — R6881 Early satiety: Secondary | ICD-10-CM | POA: Diagnosis not present

## 2022-08-11 DIAGNOSIS — Z86010 Personal history of colonic polyps: Secondary | ICD-10-CM | POA: Diagnosis not present

## 2022-08-11 DIAGNOSIS — K219 Gastro-esophageal reflux disease without esophagitis: Secondary | ICD-10-CM | POA: Diagnosis not present

## 2022-08-11 DIAGNOSIS — M810 Age-related osteoporosis without current pathological fracture: Secondary | ICD-10-CM | POA: Diagnosis not present

## 2022-08-16 NOTE — Progress Notes (Addendum)
[facility] Urodynamics Procedure  Referring Physician: Antony Contras, MD Date of Procedure: 08/19/2022  Stacey Coleman is a 72 y.o. female who presents for urodynamic evaluation. Indication(s) for study: SUI and OAB and incomplete bladder emptying   Vital Signs: BP (!) 140/86   Pulse 97   Laboratory Results: A catheterized urine specimen revealed:  POC urine: Negative for all components   Voiding Diary: Not done  Procedure Timeout:  The correct patient was verified and the correct procedure was verified. The patient was in the correct position and safety precautions were reviewed based on at the patient's history.  Urodynamic Procedure A 253F dual lumen urodynamics catheter was placed under sterile conditions into the patient's bladder. A 253F catheter was placed into the rectum in order to measure abdominal pressure. EMG patches were placed in the appropriate position.  All connections were confirmed and calibrations/adjusted made. Saline was instilled into the bladder through the dual lumen catheters.  Cough/valsalva pressures were measured periodically during filling.  Patient was allowed to void.  The bladder was then emptied of its residual.  UROFLOW: Revealed a Qmax of 11 mL/sec.  She voided 86.2 mL and had a residual of 120 mL.  It was a interrupted pattern and represented normal habits though interpretation limited due to low voided volume.  CMG: This was performed with sterile water in the sitting position at a fill rate of 20-30 mL/min.    First sensation of fullness was 58 mLs,  First urge was 88 mLs,  Strong urge was 246 mLs and  Capacity was 462.4 mLs  Stress incontinence was demonstrated Highest negative Barrier CLPP was 72 cmH20 at 259 ml. Lowest positive Barrier VLPP was 54 cmH20 at 259 ml.  Detrusor function was normal, with no phasic contractions seen.    Compliance:  Normal. End fill detrusor pressure was 0cmH20.    UPP: MUCP with barrier  reduction was 82 cm of water.    MICTURITION STUDY: Voiding was performed with reduction using scopettes in the sitting position.  Pdet at Qmax was 16.2 cm of water.  Qmax was 19.5 mL/sec.  It was a normal pattern.  She voided 342.4 mL and had a residual of 120 mL.  It was a volitional void, sustained detrusor contraction was present and abdominal straining was not present  EMG: This was performed with patches.  She had voluntary contractions, recruitment with fill was present and urethral sphincter was relaxed with void.  The details of the procedure with the study tracings have been scanned into EPIC.   Urodynamic Impression:  1. Sensation was increased; capacity was normal 2. Stress Incontinence was demonstrated at normal pressures; 3. Detrusor Overactivity was not demonstrated. 4. Initial emptying without prolapse reduction was abnormal. Emptying with prolapse reduction was normal with a normal PVR, a sustained detrusor contraction present,  abdominal straining not present, normal urethral sphincter activity on EMG.  Plan: - The patient will follow up  to discuss the findings and treatment options.

## 2022-08-19 ENCOUNTER — Encounter: Payer: Self-pay | Admitting: Obstetrics and Gynecology

## 2022-08-19 ENCOUNTER — Ambulatory Visit (INDEPENDENT_AMBULATORY_CARE_PROVIDER_SITE_OTHER): Payer: Medicare PPO | Admitting: Obstetrics and Gynecology

## 2022-08-19 VITALS — BP 140/86 | HR 97

## 2022-08-19 DIAGNOSIS — R350 Frequency of micturition: Secondary | ICD-10-CM | POA: Diagnosis not present

## 2022-08-19 DIAGNOSIS — N3941 Urge incontinence: Secondary | ICD-10-CM

## 2022-08-19 DIAGNOSIS — N393 Stress incontinence (female) (male): Secondary | ICD-10-CM

## 2022-08-19 LAB — POCT URINALYSIS DIPSTICK
Appearance: NEGATIVE
Bilirubin, UA: NEGATIVE
Blood, UA: NEGATIVE
Glucose, UA: NEGATIVE
Ketones, UA: NEGATIVE
Leukocytes, UA: NEGATIVE
Nitrite, UA: NEGATIVE
Protein, UA: NEGATIVE
Spec Grav, UA: 1.01 (ref 1.010–1.025)
Urobilinogen, UA: 0.2 E.U./dL
pH, UA: 6 (ref 5.0–8.0)

## 2022-08-19 NOTE — Patient Instructions (Signed)
Taking Care of Yourself after Urodynamics   Drink plenty of water for a day or two following your procedure. Try to have about 8 ounces (one cup) at a time, and do this 6 times or more per day unless you have fluid restrictitons AVOID irritative beverages such as coffee, tea, soda, alcoholic or citrus drinks for a day or two, as this may cause burning with urination. You may experience some discomfort or a burning sensation with urination after having this procedure. You can use over the counter Azo or pyridium to help with burning and follow the instructions on the packaging. If it does not improve within 1-2 days, or other symptoms appear (fever, chills, or difficulty urinating) call the office to speak to a nurse.  You may return to normal daily activities such as work, school, driving, exercising and housework on the day of the procedure.    

## 2022-09-06 ENCOUNTER — Ambulatory Visit: Payer: Medicare PPO | Admitting: Obstetrics and Gynecology

## 2022-09-06 ENCOUNTER — Encounter: Payer: Self-pay | Admitting: Obstetrics and Gynecology

## 2022-09-06 VITALS — BP 128/84 | HR 87

## 2022-09-06 DIAGNOSIS — N812 Incomplete uterovaginal prolapse: Secondary | ICD-10-CM | POA: Diagnosis not present

## 2022-09-06 DIAGNOSIS — N393 Stress incontinence (female) (male): Secondary | ICD-10-CM

## 2022-09-06 DIAGNOSIS — N8110 Cystocele, unspecified: Secondary | ICD-10-CM

## 2022-09-06 NOTE — Patient Instructions (Addendum)
Plan for surgery:  Exam under anesthesia, total vaginal hysterectomy, possible bilateral salpingo-oophorectomy, uterosacral ligament suspension, anterior repair, midurethral sling, cystoscopy   General Surgical Risks: For all procedures, there are risks of bleeding, infection, damage to surrounding organs including but not limited to bowel, bladder, blood vessels, ureters and nerves, and need for further surgery if an injury were to occur. These risks are all low with minimally invasive surgery.   There are risks of numbness and weakness at any body site or buttock/rectal pain.  It is possible that baseline pain can be worsened by surgery, either with or without mesh. If surgery is vaginal, there is also a low risk of possible conversion to laparoscopy or open abdominal incision where indicated. Very rare risks include blood transfusion, blood clot, heart attack, pneumonia, or death.   There is also a risk of short-term postoperative urinary retention with need to use a catheter. About half of patients need to go home from surgery with a catheter, which is then later removed in the office. The risk of long-term need for a catheter is very low. There is also a risk of worsening of overactive bladder.   Sling: The effectiveness of a midurethral vaginal mesh sling is approximately 85%, and thus, there will be times when you may leak urine after surgery, especially if your bladder is full or if you have a strong cough. There is a balance between making the sling tight enough to treat your leakage but not too tight so that you have long-term difficulty emptying your bladder. A mesh sling will not directly treat overactive bladder/urge incontinence and may worsen it.  There is an FDA safety notification on vaginal mesh procedures for prolapse but NOT mesh slings. We have extensive experience and training with mesh placement and we have close postoperative follow up to identify any potential complications from  mesh. It is important to realize that this mesh is a permanent implant that cannot be easily removed. There are rare risks of mesh exposure (2-4%), pain with intercourse (0-7%), and infection (<1%). The risk of mesh exposure if more likely in a woman with risks for poor healing (prior radiation, poorly controlled diabetes, or immunocompromised). The risk of new or worsened chronic pain after mesh implant is more common in women with baseline chronic pain and/or poorly controlled anxiety or depression. Approximately 2-4% of patients will experience longer-term post-operative voiding dysfunction that may require surgical revision of the sling. We also reviewed that postoperatively, her stream may not be as strong as before surgery.   Prolapse (with or without mesh): Risk factors for surgical failure  include things that put pressure on your pelvis and the surgical repair, including obesity, chronic cough, and heavy lifting or straining (including lifting children or adults, straining on the toilet, or lifting heavy objects such as furniture or anything weighing >25 lbs. Risks of recurrence is 20-30% with vaginal native tissue repair and a less than 10% with sacrocolpopexy with mesh.

## 2022-09-06 NOTE — Progress Notes (Unsigned)
[facility] Return Visit  SUBJECTIVE  History of Present Illness: ZIA LINDSKOG is a 71 y.o. female seen in follow-up for prolapse after urodynamic testing.   Urodynamic Impression:  1. Sensation was increased; capacity was normal 2. Stress Incontinence was demonstrated at normal pressures; 3. Detrusor Overactivity was not demonstrated. 4. Initial emptying without prolapse reduction was abnormal. Emptying with prolapse reduction was normal with a normal PVR, a sustained detrusor contraction present,  abdominal straining not present, normal urethral sphincter activity on EMG.  Past Medical History: Patient  has a past medical history of Anemia (2013), Arthritis, Bursitis, Cancer ([place]) (2004), Family history of adverse reaction to anesthesia, GERD (gastroesophageal reflux disease), Hypertension, Hypothyroid, Hypothyroidism, Laryngotracheal cleft, Nasal congestion, Osteoporosis, Prediabetes, Seasonal allergies, Sleep apnea, Wears hearing aid, and Wears partial dentures.   Past Surgical History: She  has a past surgical history that includes Knee arthroscopy w/ ACL reconstruction (2001); Knee arthroscopy (1990); Knee arthroscopy (2002); Bone Resection (2004); Ankle arthroscopy (2008); Colonoscopy; Knee arthroscopy with medial menisectomy (Left, 12/13/2013); Total knee arthroplasty (Right, 08/28/2014); Shoulder surgery (Left, 2004); and Closed reduction finger with percutaneous pinning (Right, 10/30/2020).   Medications: She has a current medication list which includes the following prescription(s): denosumab, omeprazole, amlodipine, amlodipine, benazepril-hydrochlorthiazide, calcium carbonate, calcium gluconate, vitamin d3, diclofenac sodium, fexofenadine, ibuprofen-diphenhydramine cit, lactobacillus, levothyroxine, methocarbamol, nabumetone, omega-3, systane balance, red yeast rice, and triamcinolone.   Allergies: Patient is allergic to other.   Social History: Patient  reports that  she has never smoked. She has never used smokeless tobacco. She reports current alcohol use. She reports that she does not use drugs.      OBJECTIVE     Physical Exam: Vitals:   09/06/22 1124  BP: 128/84  Pulse: 87   Gen: No apparent distress, A&O x 3.  Detailed Urogynecologic Evaluation:  Deferred. Prior exam showed:  POP-Q   0.5                                            Aa   0.5                                           Ba   -4                                              C    3                                            Gh   2.5                                            Pb   7  tvl    -2.5                                            Ap   -2.5                                            Bp   -5.5                                              D        ASSESSMENT AND PLAN    Ms. Aderholt is a 72 y.o. with:  1. Prolapse of anterior vaginal wall   2. Uterovaginal prolapse, incomplete   3. SUI (stress urinary incontinence, female)     Plan for surgery: Exam under anesthesia, total vaginal hysterectomy, possible bilateral salpingo-oophorectomy, uterosacral ligament suspension, anterior repair, midurethral sling, cystoscopy  - We reviewed the patient's specific anatomic and functional findings, with the assistance of diagrams, and together finalized the above procedure. The planned surgical procedures were discussed along with the surgical risks outlined below, which were also provided on a detailed handout. Additional treatment options including expectant management, conservative management, medical management were discussed where appropriate.  We reviewed the benefits and risks of each treatment option.   General Surgical Risks: For all procedures, there are risks of bleeding, infection, damage to surrounding organs including but not limited to bowel, bladder, blood vessels, ureters and nerves, and need for further surgery if  an injury were to occur. These risks are all low with minimally invasive surgery.   There are risks of numbness and weakness at any body site or buttock/rectal pain.  It is possible that baseline pain can be worsened by surgery, either with or without mesh. If surgery is vaginal, there is also a low risk of possible conversion to laparoscopy or open abdominal incision where indicated. Very rare risks include blood transfusion, blood clot, heart attack, pneumonia, or death.   There is also a risk of short-term postoperative urinary retention with need to use a catheter. About half of patients need to go home from surgery with a catheter, which is then later removed in the office. The risk of long-term need for a catheter is very low. There is also a risk of worsening of overactive bladder.   Sling: The effectiveness of a midurethral vaginal mesh sling is approximately 85%, and thus, there will be times when you may leak urine after surgery, especially if your bladder is full or if you have a strong cough. There is a balance between making the sling tight enough to treat your leakage but not too tight so that you have long-term difficulty emptying your bladder. A mesh sling will not directly treat overactive bladder/urge incontinence and may worsen it.  There is an FDA safety notification on vaginal mesh procedures for prolapse but NOT mesh slings. We have extensive experience and training with mesh placement and we have close postoperative follow up to identify any potential complications from mesh. It is important to realize that this mesh is a permanent implant that cannot be easily removed. There are rare risks of mesh  exposure (2-4%), pain with intercourse (0-7%), and infection (<1%). The risk of mesh exposure if more likely in a woman with risks for poor healing (prior radiation, poorly controlled diabetes, or immunocompromised). The risk of new or worsened chronic pain after mesh implant is more common  in women with baseline chronic pain and/or poorly controlled anxiety or depression. Approximately 2-4% of patients will experience longer-term post-operative voiding dysfunction that may require surgical revision of the sling. We also reviewed that postoperatively, her stream may not be as strong as before surgery.   Prolapse (with or without mesh): Risk factors for surgical failure  include things that put pressure on your pelvis and the surgical repair, including obesity, chronic cough, and heavy lifting or straining (including lifting children or adults, straining on the toilet, or lifting heavy objects such as furniture or anything weighing >25 lbs. Risks of recurrence is 20-30% with vaginal native tissue repair and a less than 10% with sacrocolpopexy with mesh.    - For preop Visit:  She is required to have a visit within 30 days of her surgery.    - Medical clearance: not required  - Anticoagulant use: No - Medicaid Hysterectomy form: No - Accepts blood transfusion: Yes - Expected length of stay: outpatient  Request sent for surgery scheduling.   Jaquita Folds, MD

## 2022-09-10 ENCOUNTER — Ambulatory Visit: Payer: Medicare PPO | Admitting: Obstetrics and Gynecology

## 2022-09-13 ENCOUNTER — Encounter: Payer: Self-pay | Admitting: *Deleted

## 2022-09-17 ENCOUNTER — Ambulatory Visit: Payer: Medicare PPO | Admitting: Obstetrics and Gynecology

## 2022-09-30 DIAGNOSIS — Z1231 Encounter for screening mammogram for malignant neoplasm of breast: Secondary | ICD-10-CM | POA: Diagnosis not present

## 2022-10-25 ENCOUNTER — Ambulatory Visit (INDEPENDENT_AMBULATORY_CARE_PROVIDER_SITE_OTHER): Payer: Medicare PPO | Admitting: Obstetrics and Gynecology

## 2022-10-25 ENCOUNTER — Encounter: Payer: Self-pay | Admitting: Obstetrics and Gynecology

## 2022-10-25 VITALS — BP 137/84 | HR 94 | Wt 144.6 lb

## 2022-10-25 DIAGNOSIS — N393 Stress incontinence (female) (male): Secondary | ICD-10-CM

## 2022-10-25 DIAGNOSIS — N812 Incomplete uterovaginal prolapse: Secondary | ICD-10-CM

## 2022-10-25 DIAGNOSIS — Z01818 Encounter for other preprocedural examination: Secondary | ICD-10-CM

## 2022-10-25 MED ORDER — POLYETHYLENE GLYCOL 3350 17 GM/SCOOP PO POWD: 17.0000 g | Freq: Every day | ORAL | 0 refills | Status: DC

## 2022-10-25 MED ORDER — OXYCODONE HCL 5 MG PO TABS: 5.0000 mg | ORAL_TABLET | ORAL | 0 refills | Status: DC | PRN

## 2022-10-25 MED ORDER — IBUPROFEN 600 MG PO TABS: 600.0000 mg | ORAL_TABLET | Freq: Four times a day (QID) | ORAL | 0 refills | Status: DC | PRN

## 2022-10-25 MED ORDER — ACETAMINOPHEN 500 MG PO TABS: 500.0000 mg | ORAL_TABLET | Freq: Four times a day (QID) | ORAL | 0 refills | Status: DC | PRN

## 2022-10-25 NOTE — H&P (Signed)
[facility] H&P  Subjective Chief Complaint: Stacey Coleman presents for a preoperative encounter.   History of Present Illness: Stacey Coleman is a 72 y.o. female who presents for preoperative visit.  She is scheduled to undergo  Exam under anesthesia, total vaginal hysterectomy, possible bilateral salpingo-oophorectomy, uterosacral ligament suspension, anterior repair, midurethral sling, cystoscopy  on 11/08/22.  Her symptoms include pelvic organ prolapse and stress urinary incontinence, and she was was found to have Stage II anterior, Stage I posterior, Stage I apical prolapse.   Urodynamics showed: 1. Sensation was increased; capacity was normal 2. Stress Incontinence was demonstrated at normal pressures; 3. Detrusor Overactivity was not demonstrated. 4. Initial emptying without prolapse reduction was abnormal. Emptying with prolapse reduction was normal with a normal PVR, a sustained detrusor contraction present,  abdominal straining not present, normal urethral sphincter activity on EMG.  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   Family history of adverse reaction to anesthesia    mother nausea/vomiting   GERD (gastroesophageal reflux disease)    Hypertension    Hypothyroid    Hypothyroidism    Laryngotracheal cleft    laryngotracheal reflux   Nasal congestion    nasal dripping   Osteoporosis    Prediabetes    Seasonal allergies    Sleep apnea    mild, no CPAP   Wears hearing aid    both ears   Wears partial dentures      Past Surgical History:  Procedure Laterality Date   ANKLE ARTHROSCOPY  2008   spurs-left   BONE RESECTION  2004   left humerous   CLOSED REDUCTION FINGER WITH PERCUTANEOUS PINNING Right 10/30/2020   Procedure: CLOSED REDUCTION RIGHT SMALL PROXIMAL PHALANX FRACTURE OF  FINGER WITH PERCUTANEOUS PINNING;  Surgeon: [name], MD;  Location: [facility];  Service: Orthopedics;  Laterality: Right;   COLONOSCOPY     KNEE ARTHROSCOPY  1990   rt   KNEE ARTHROSCOPY  2002   rt   KNEE ARTHROSCOPY W/ ACL RECONSTRUCTION  2001   right   KNEE ARTHROSCOPY WITH MEDIAL MENISECTOMY Left 12/13/2013   Procedure: LEFT KNEE ARTHROSCOPY WITH MEDIAL MENISECTOMY, PLICA EXCISION, CHONDROPLASTY;  Surgeon: Ninetta Lights, MD;  Location: [facility];  Service: Orthopedics;  Laterality: Left;   SHOULDER SURGERY Left 2004   Removed chondro sarcoma   TOTAL KNEE ARTHROPLASTY Right 08/28/2014   Procedure: RIGHT TOTAL KNEE ARTHROPLASTY;  Surgeon: Ninetta Lights, MD;  Location: [facility];  Service: Orthopedics;  Laterality: Right;    has No Known Allergies.   Family History  Problem Relation Age of Onset   Breast cancer Mother    Heart attack Father    Hypertension Father    Diabetes Brother    Heart attack Maternal Grandfather    Colon cancer Paternal Grandfather    Diabetes Other     Social History   Tobacco Use   Smoking status: Never   Smokeless tobacco: Never  Vaping Use   Vaping Use: Never used  Substance Use Topics   Alcohol use: Yes    Comment: occ   Drug use: No     Review of Systems was negative for a full 10 system review except as noted in the History of Present Illness.  No current facility-administered medications for this encounter.  Current Outpatient Medications:  acetaminophen (TYLENOL) 500 MG tablet, Take 1 tablet (500 mg total) by mouth every 6 (six) hours as needed (pain)., Disp: 30 tablet, Rfl: 0   amLODipine (NORVASC) 5 MG tablet, Take 5 mg by mouth daily., Disp: , Rfl:    amLODipine (NORVASC) 5 MG tablet, , Disp: , Rfl:    benazepril-hydrochlorthiazide (LOTENSIN HCT) 20-12.5 MG per tablet, Take 1 tablet by mouth daily., Disp: , Rfl:    calcium carbonate (TUMS - DOSED IN MG ELEMENTAL CALCIUM) 500 MG chewable tablet, Chew by mouth., Disp: , Rfl:    calcium gluconate 500 MG tablet, Take 1 tablet by  mouth 3 (three) times daily., Disp: , Rfl:    Cholecalciferol (VITAMIN D3) 2000 units capsule, Take by mouth daily., Disp: , Rfl:    denosumab (PROLIA) 60 MG/ML SOSY injection, Inject 60 mg into the skin every 6 (six) months., Disp: , Rfl:    diclofenac Sodium (VOLTAREN) 1 % GEL, diclofenac 1 % topical gel, Disp: , Rfl:    fexofenadine (ALLEGRA) 180 MG tablet, Take 180 mg by mouth daily., Disp: , Rfl:    ibuprofen (ADVIL) 600 MG tablet, Take 1 tablet (600 mg total) by mouth every 6 (six) hours as needed., Disp: 30 tablet, Rfl: 0   Ibuprofen-diphenhydrAMINE Cit (ADVIL PM PO), Take by mouth., Disp: , Rfl:    Lactobacillus (PROBIOTIC ACIDOPHILUS PO), Take by mouth daily., Disp: , Rfl:    levothyroxine (SYNTHROID, LEVOTHROID) 125 MCG tablet, Take 125 mcg by mouth daily before breakfast., Disp: , Rfl:    methocarbamol (ROBAXIN) 500 MG tablet, Take 500 mg by mouth 4 (four) times daily., Disp: , Rfl:    nabumetone (RELAFEN) 500 MG tablet, 1-2 tablets, Disp: , Rfl:    Omega-3 1000 MG CAPS, Take by mouth., Disp: , Rfl:    omeprazole (PRILOSEC) 20 MG capsule, Take 20 mg by mouth daily., Disp: , Rfl:    oxyCODONE (OXY IR/ROXICODONE) 5 MG immediate release tablet, Take 1 tablet (5 mg total) by mouth every 4 (four) hours as needed for severe pain., Disp: 15 tablet, Rfl: 0   polyethylene glycol powder (GLYCOLAX/MIRALAX) 17 GM/SCOOP powder, Take 17 g by mouth daily. Drink 17g (1 scoop) dissolved in water per day., Disp: 255 g, Rfl: 0   Propylene Glycol (SYSTANE BALANCE) 0.6 % SOLN, Apply to eye., Disp: , Rfl:    Red Yeast Rice 600 MG CAPS, Take by mouth., Disp: , Rfl:    triamcinolone (NASACORT) 55 MCG/ACT AERO nasal inhaler, Place 2 sprays into both nostrils at bedtime., Disp: , Rfl:    Objective Gen: NAD CV: S1 S2 RRR Lungs: Clear to auscultation bilaterally Abd: soft, nontender   Previous Pelvic Exam showed: Normal external female genitalia; Bartholin's and Skene's glands normal in appearance;  urethral meatus normal in appearance, no urethral masses or discharge.    CST: negative   Speculum exam reveals normal vaginal mucosa with atrophy. Cervix normal appearance. Uterus normal single, nontender. Adnexa no mass, fullness, tenderness.       Pelvic floor strength I/V   Pelvic floor musculature: Right levator non-tender, Right obturator non-tender, Left levator non-tender, Left obturator non-tender   POP-Q:    POP-Q   0.5                                            Aa   0.5  Ba   -4                                              C    3                                            Gh   2.5                                            Pb   7                                            tvl    -2.5                                            Ap   -2.5                                            Bp   -5.5                                              D          Rectal Exam:  Normal sphincter tone    Assessment/ Plan  Assessment: The patient is a 72 y.o. year old scheduled to undergo Exam under anesthesia, total vaginal hysterectomy, possible bilateral salpingo-oophorectomy, uterosacral ligament suspension, anterior repair, midurethral sling, cystoscopy. Verbal consent was obtained for these procedures.

## 2022-10-25 NOTE — Progress Notes (Signed)
[facility] Pre-Operative Evaluation  Subjective Chief Complaint: Stacey Coleman presents for a preoperative encounter.   History of Present Illness: Stacey Coleman is a 72 y.o. female who presents for preoperative visit.  She is scheduled to undergo  Exam under anesthesia, total vaginal hysterectomy, possible bilateral salpingo-oophorectomy, uterosacral ligament suspension, anterior repair, midurethral sling, cystoscopy  on 11/08/22.  Her symptoms include pelvic organ prolapse and stress urinary incontinence, and she was was found to have Stage II anterior, Stage I posterior, Stage I apical prolapse.   Urodynamics showed: 1. Sensation was increased; capacity was normal 2. Stress Incontinence was demonstrated at normal pressures; 3. Detrusor Overactivity was not demonstrated. 4. Initial emptying without prolapse reduction was abnormal. Emptying with prolapse reduction was normal with a normal PVR, a sustained detrusor contraction present,  abdominal straining not present, normal urethral sphincter activity on EMG.  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   Family history of adverse reaction to anesthesia    mother nausea/vomiting   GERD (gastroesophageal reflux disease)    Hypertension    Hypothyroid    Hypothyroidism    Laryngotracheal cleft    laryngotracheal reflux   Nasal congestion    nasal dripping   Osteoporosis    Prediabetes    Seasonal allergies    Sleep apnea    mild, no CPAP   Wears hearing aid    both ears   Wears partial dentures      Past Surgical History:  Procedure Laterality Date   ANKLE ARTHROSCOPY  2008   spurs-left   BONE RESECTION  2004   left humerous   CLOSED REDUCTION FINGER WITH PERCUTANEOUS PINNING Right 10/30/2020   Procedure: CLOSED REDUCTION RIGHT SMALL PROXIMAL PHALANX FRACTURE OF  FINGER WITH PERCUTANEOUS PINNING;  Surgeon: [name],  MD;  Location: [place];  Service: Orthopedics;  Laterality: Right;   COLONOSCOPY     KNEE ARTHROSCOPY  1990   rt   KNEE ARTHROSCOPY  2002   rt   KNEE ARTHROSCOPY W/ ACL RECONSTRUCTION  2001   right   KNEE ARTHROSCOPY WITH MEDIAL MENISECTOMY Left 12/13/2013   Procedure: LEFT KNEE ARTHROSCOPY WITH MEDIAL MENISECTOMY, PLICA EXCISION, CHONDROPLASTY;  Surgeon: Ninetta Lights, MD;  Location: [place];  Service: Orthopedics;  Laterality: Left;   SHOULDER SURGERY Left 2004   Removed chondro sarcoma   TOTAL KNEE ARTHROPLASTY Right 08/28/2014   Procedure: RIGHT TOTAL KNEE ARTHROPLASTY;  Surgeon: Ninetta Lights, MD;  Location: [place];  Service: Orthopedics;  Laterality: Right;    is allergic to other.   Family History  Problem Relation Age of Onset   Breast cancer Mother    Heart attack Father    Hypertension Father    Diabetes Brother    Heart attack Maternal Grandfather    Colon cancer Paternal Grandfather    Diabetes Other     Social History   Tobacco Use   Smoking status: Never   Smokeless tobacco: Never  Vaping Use   Vaping Use: Never used  Substance Use Topics   Alcohol use: Yes    Comment: occ   Drug use: No     Review of Systems was negative for a full 10 system review except as noted in the History of Present Illness.   Current Outpatient Medications:    amLODipine (NORVASC) 5 MG tablet,  Take 5 mg by mouth daily., Disp: , Rfl:    amLODipine (NORVASC) 5 MG tablet, , Disp: , Rfl:    benazepril-hydrochlorthiazide (LOTENSIN HCT) 20-12.5 MG per tablet, Take 1 tablet by mouth daily., Disp: , Rfl:    calcium carbonate (TUMS - DOSED IN MG ELEMENTAL CALCIUM) 500 MG chewable tablet, Chew by mouth., Disp: , Rfl:    calcium gluconate 500 MG tablet, Take 1 tablet by mouth 3 (three) times daily., Disp: , Rfl:    Cholecalciferol (VITAMIN D3) 2000 units capsule, Take by mouth daily., Disp: , Rfl:    denosumab (PROLIA) 60 MG/ML SOSY injection, Inject  60 mg into the skin every 6 (six) months., Disp: , Rfl:    diclofenac Sodium (VOLTAREN) 1 % GEL, diclofenac 1 % topical gel, Disp: , Rfl:    fexofenadine (ALLEGRA) 180 MG tablet, Take 180 mg by mouth daily., Disp: , Rfl:    Ibuprofen-diphenhydrAMINE Cit (ADVIL PM PO), Take by mouth., Disp: , Rfl:    Lactobacillus (PROBIOTIC ACIDOPHILUS PO), Take by mouth daily., Disp: , Rfl:    levothyroxine (SYNTHROID, LEVOTHROID) 125 MCG tablet, Take 125 mcg by mouth daily before breakfast., Disp: , Rfl:    methocarbamol (ROBAXIN) 500 MG tablet, Take 500 mg by mouth 4 (four) times daily., Disp: , Rfl:    nabumetone (RELAFEN) 500 MG tablet, 1-2 tablets, Disp: , Rfl:    Omega-3 1000 MG CAPS, Take by mouth., Disp: , Rfl:    omeprazole (PRILOSEC) 20 MG capsule, Take 20 mg by mouth daily., Disp: , Rfl:    Propylene Glycol (SYSTANE BALANCE) 0.6 % SOLN, Apply to eye., Disp: , Rfl:    Red Yeast Rice 600 MG CAPS, Take by mouth., Disp: , Rfl:    triamcinolone (NASACORT) 55 MCG/ACT AERO nasal inhaler, Place 2 sprays into both nostrils at bedtime., Disp: , Rfl:    Objective There were no vitals filed for this visit.  Gen: NAD CV: S1 S2 RRR Lungs: Clear to auscultation bilaterally Abd: soft, nontender   Previous Pelvic Exam showed: Normal external female genitalia; Bartholin's and Skene's glands normal in appearance; urethral meatus normal in appearance, no urethral masses or discharge.    CST: negative   Speculum exam reveals normal vaginal mucosa with atrophy. Cervix normal appearance. Uterus normal single, nontender. Adnexa no mass, fullness, tenderness.       Pelvic floor strength I/V   Pelvic floor musculature: Right levator non-tender, Right obturator non-tender, Left levator non-tender, Left obturator non-tender   POP-Q:    POP-Q   0.5                                            Aa   0.5                                           Ba   -4                                              C    3  Gh   2.5                                            Pb   7                                            tvl    -2.5                                            Ap   -2.5                                            Bp   -5.5                                              D          Rectal Exam:  Normal sphincter tone    Assessment/ Plan  Assessment: The patient is a 72 y.o. year old scheduled to undergo Exam under anesthesia, total vaginal hysterectomy, possible bilateral salpingo-oophorectomy, uterosacral ligament suspension, anterior repair, midurethral sling, cystoscopy. Verbal consent was obtained for these procedures.  Plan: General Surgical Consent: The patient has previously been counseled on alternative treatments, and the decision by the patient and provider was to proceed with the procedure listed above.  For all procedures, there are risks of bleeding, infection, damage to surrounding organs including but not limited to bowel, bladder, blood vessels, ureters and nerves, and need for further surgery if an injury were to occur. These risks are all low with minimally invasive surgery.   There are risks of numbness and weakness at any body site or buttock/rectal pain.  It is possible that baseline pain can be worsened by surgery, either with or without mesh. If surgery is vaginal, there is also a low risk of possible conversion to laparoscopy or open abdominal incision where indicated. Very rare risks include blood transfusion, blood clot, heart attack, pneumonia, or death.   There is also a risk of short-term postoperative urinary retention with need to use a catheter. About half of patients need to go home from surgery with a catheter, which is then later removed in the office. The risk of long-term need for a catheter is very low. There is also a risk of worsening of overactive bladder.   Sling: The effectiveness of a midurethral vaginal  mesh sling is approximately 85%, and thus, there will be times when you may leak urine after surgery, especially if your bladder is full or if you have a strong cough. There is a balance between making the sling tight enough to treat your leakage but not too tight so that you have long-term difficulty emptying your bladder. A mesh sling will not directly treat overactive bladder/urge incontinence and may worsen it.  There is an FDA safety notification on vaginal mesh procedures for prolapse but NOT mesh slings. We have  extensive experience and training with mesh placement and we have close postoperative follow up to identify any potential complications from mesh. It is important to realize that this mesh is a permanent implant that cannot be easily removed. There are rare risks of mesh exposure (2-4%), pain with intercourse (0-7%), and infection (<1%). The risk of mesh exposure if more likely in a woman with risks for poor healing (prior radiation, poorly controlled diabetes, or immunocompromised). The risk of new or worsened chronic pain after mesh implant is more common in women with baseline chronic pain and/or poorly controlled anxiety or depression. Approximately 2-4% of patients will experience longer-term post-operative voiding dysfunction that may require surgical revision of the sling. We also reviewed that postoperatively, her stream may not be as strong as before surgery.   Prolapse (with or without mesh): Risk factors for surgical failure  include things that put pressure on your pelvis and the surgical repair, including obesity, chronic cough, and heavy lifting or straining (including lifting children or adults, straining on the toilet, or lifting heavy objects such as furniture or anything weighing >25 lbs. Risks of recurrence is 20-30% with vaginal native tissue repair and a less than 10% with sacrocolpopexy with mesh.     We discussed consent for blood products. Risks for blood transfusion  include allergic reactions, other reactions that can affect different body organs and managed accordingly, transmission of infectious diseases such as HIV or Hepatitis. However, the blood is screened. Patient consents for blood products.  Pre-operative instructions:  She was instructed to not take Aspirin/NSAIDs x 7days prior to surgery. Antibiotic prophylaxis was ordered as indicated.  Catheter use: Patient will go home with foley if needed after post-operative voiding trial.  Post-operative instructions:  She was provided with specific post-operative instructions, including precautions and signs/symptoms for which we would recommend contacting us, in addition to daytime and after-hours contact phone numbers. This was provided on a handout.   Post-operative medications: Prescriptions for motrin, tylenol, miralax, and oxycodone were sent to her pharmacy. Discussed using ibuprofen and tylenol on a schedule to limit use of narcotics.   Laboratory testing:  We will check labs:CBC and Type and Screen  Preoperative clearance:  She does not require surgical clearance.    Post-operative follow-up:  A post-operative appointment will be made for 6 weeks from the date of surgery. If she needs a post-operative nurse visit for a voiding trial, that will be set up after she leaves the hospital.    Patient will call the clinic or use MyChart should anything change or any new issues arise.   Berton Mount, NP

## 2022-10-25 NOTE — Patient Instructions (Signed)
You can pick up your medications from the pharmacy but do not start taking them until after surgery.

## 2022-10-29 ENCOUNTER — Encounter (HOSPITAL_BASED_OUTPATIENT_CLINIC_OR_DEPARTMENT_OTHER): Payer: Self-pay | Admitting: Obstetrics and Gynecology

## 2022-10-29 ENCOUNTER — Other Ambulatory Visit: Payer: Self-pay

## 2022-10-29 NOTE — Progress Notes (Signed)
Spoke w/ via phone for pre-op interview---[facility] needs dos----none               Lab results------11/03/2022 lab appt for cbc, bmp, type & screen, ekg COVID test -----patient states asymptomatic no test needed Arrive at -------0530 on Monday, 11/08/2022 NPO after MN NO Solid Food.  Clear liquids from MN until---0430 Med rec completed Medications to take morning of surgery -----Amlodipine, Allegra, Synthroid, Robaxin prn, Prilosec, eye drops Diabetic medication -----n/a Patient instructed no nail polish to be worn day of surgery Patient instructed to bring photo id and insurance card day of surgery Patient aware to have Driver (ride ) / caregiver    for 24 hours after surgery - friends- Harriett Sine and / or [facility] Patient Special Instructions -----Extended / overnight stay instructions given. Pre-Op special Istructions -----Patient is aware dentures and hearing aids need to be removed prior to OR. Patient verbalized understanding of instructions that were given at this phone interview. Patient denies shortness of breath, chest pain, fever, cough at this phone interview.

## 2022-10-29 NOTE — Progress Notes (Signed)
Your procedure is scheduled on Monday, 11/08/2022.  Report to [facility] [place] AT  5:30 AM.   Call this number if you have problems the morning of surgery  [phone].   OUR ADDRESS IS [address].  WE ARE LOCATED IN [facility].  PLEASE BRING YOUR INSURANCE CARD AND PHOTO ID DAY OF SURGERY.  ONLY 2 PEOPLE ARE ALLOWED IN  WAITING  ROOM / CURRENTLY NO ONE UNDER AGE 74                                     REMEMBER:  DO NOT EAT FOOD, CANDY GUM OR MINTS  AFTER MIDNIGHT THE NIGHT BEFORE YOUR SURGERY . YOU MAY HAVE CLEAR LIQUIDS FROM MIDNIGHT THE NIGHT BEFORE YOUR SURGERY UNTIL  4:30 AM. NO CLEAR LIQUIDS AFTER   4:30 AM DAY OF SURGERY.  YOU MAY  BRUSH YOUR TEETH MORNING OF SURGERY AND RINSE YOUR MOUTH OUT, NO CHEWING GUM CANDY OR MINTS.     CLEAR LIQUID DIET    Allowed      Water                                                                   Coffee and tea, regular and decaf  (NO cream or milk products of any type, may sweeten)                         Carbonated beverages, regular and diet                                    Sports drinks like Gatorade _____________________________________________________________________     TAKE ONLY THESE MEDICATIONS MORNING OF SURGERY:  Amlodipine, Allegra, Synthroid, Robaxin if needed, Prilosec, eye drops    UP TO 4 VISITORS  MAY VISIT IN THE EXTENDED RECOVERY ROOM UNTIL 800 PM ONLY.  ONE  VISITOR AGE 21 AND OVER MAY SPEND THE NIGHT AND MUST BE IN EXTENDED RECOVERY ROOM NO LATER THAN 800 PM . YOUR DISCHARGE TIME AFTER YOU SPEND THE NIGHT IS 900 AM THE MORNING AFTER YOUR SURGERY.  YOU MAY PACK A SMALL OVERNIGHT BAG WITH TOILETRIES FOR YOUR OVERNIGHT STAY IF YOU WISH.  YOUR PRESCRIPTION MEDICATIONS WILL BE PROVIDED DURING [facility] STAY.                                      DO NOT WEAR JEWERLY/  METAL/  PIERCINGS (INCLUDING NO PLASTIC PIERCINGS) DO NOT WEAR LOTIONS, POWDERS, PERFUMES OR NAIL  POLISH ON YOUR FINGERNAILS. TOENAIL POLISH IS OK TO WEAR. DO NOT SHAVE FOR 48 HOURS PRIOR TO DAY OF SURGERY.  CONTACTS, GLASSES, OR DENTURES MAY NOT BE WORN TO SURGERY.  REMEMBER: NO SMOKING, VAPING ,  DRUGS OR ALCOHOL FOR 24 HOURS BEFORE YOUR SURGERY.  [facility] IS NOT RESPONSIBLE  FOR ANY BELONGINGS.                                                                    Marland Kitchen           [facility] - Preparing for Surgery Before surgery, you can play an important role.  Because skin is not sterile, your skin needs to be as free of germs as possible.  You can reduce the number of germs on your skin by washing with CHG (chlorahexidine gluconate) soap before surgery.  CHG is an antiseptic cleaner which kills germs and bonds with the skin to continue killing germs even after washing. Please DO NOT use if you have an allergy to CHG or antibacterial soaps.  If your skin becomes reddened/irritated stop using the CHG and inform your nurse when you arrive at Short Stay. Do not shave (including legs and underarms) for at least 48 hours prior to the first CHG shower.  You may shave your face/neck. Please follow these instructions carefully:  1.  Shower with CHG Soap the night before surgery and the  morning of Surgery.  2.  If you choose to wash your hair, wash your hair first as usual with your  normal  shampoo.  3.  After you shampoo, rinse your hair and body thoroughly to remove the  shampoo.                                        4.  Use CHG as you would any other liquid soap.  You can apply chg directly  to the skin and wash , chg soap provided, night before and morning of your surgery.  5.  Apply the CHG Soap to your body ONLY FROM THE NECK DOWN.   Do not use on face/ open                           Wound or open sores. Avoid contact with eyes, ears mouth and genitals (private parts).                       Wash face,  Genitals (private parts) with your normal soap.              6.  Wash thoroughly, paying special attention to the area where your surgery  will be performed.  7.  Thoroughly rinse your body with warm water from the neck down.  8.  DO NOT shower/wash with your normal soap after using and rinsing off  the CHG Soap.             9.  Pat yourself dry with a clean towel.            10.  Wear clean pajamas.            11.  Place clean sheets on your bed the night of your first shower and do not  sleep with pets. Day of Surgery : Do not apply any lotions/ powders the morning of surgery.  Please wear clean clothes to the hospital/surgery  center.  IF YOU HAVE ANY SKIN IRRITATION OR PROBLEMS WITH THE SURGICAL SOAP, PLEASE GET A BAR OF GOLD DIAL SOAP AND SHOWER THE NIGHT BEFORE YOUR SURGERY AND THE MORNING OF YOUR SURGERY. PLEASE LET THE NURSE KNOW MORNING OF YOUR SURGERY IF YOU HAD ANY PROBLEMS WITH THE SURGICAL SOAP.   ________________________________________________________________________                                                        QUESTIONS [facility] PRE OP NURSE PHONE [phone].

## 2022-11-01 ENCOUNTER — Other Ambulatory Visit: Payer: Self-pay | Admitting: Family Medicine

## 2022-11-01 DIAGNOSIS — Z0000 Encounter for general adult medical examination without abnormal findings: Secondary | ICD-10-CM | POA: Diagnosis not present

## 2022-11-01 DIAGNOSIS — E7800 Pure hypercholesterolemia, unspecified: Secondary | ICD-10-CM

## 2022-11-01 DIAGNOSIS — R1319 Other dysphagia: Secondary | ICD-10-CM | POA: Diagnosis not present

## 2022-11-01 DIAGNOSIS — R7303 Prediabetes: Secondary | ICD-10-CM | POA: Diagnosis not present

## 2022-11-01 DIAGNOSIS — Z1331 Encounter for screening for depression: Secondary | ICD-10-CM | POA: Diagnosis not present

## 2022-11-01 DIAGNOSIS — Z23 Encounter for immunization: Secondary | ICD-10-CM | POA: Diagnosis not present

## 2022-11-01 DIAGNOSIS — G4700 Insomnia, unspecified: Secondary | ICD-10-CM | POA: Diagnosis not present

## 2022-11-01 DIAGNOSIS — I10 Essential (primary) hypertension: Secondary | ICD-10-CM | POA: Diagnosis not present

## 2022-11-01 DIAGNOSIS — E039 Hypothyroidism, unspecified: Secondary | ICD-10-CM | POA: Diagnosis not present

## 2022-11-01 DIAGNOSIS — M85851 Other specified disorders of bone density and structure, right thigh: Secondary | ICD-10-CM | POA: Diagnosis not present

## 2022-11-03 ENCOUNTER — Encounter (HOSPITAL_COMMUNITY)
Admission: RE | Admit: 2022-11-03 | Discharge: 2022-11-03 | Disposition: A | Discharge status: Home / Self Care | Payer: Medicare PPO | Source: Ambulatory Visit | Attending: Obstetrics and Gynecology | Admitting: Obstetrics and Gynecology

## 2022-11-03 DIAGNOSIS — Z01812 Encounter for preprocedural laboratory examination: Secondary | ICD-10-CM

## 2022-11-03 DIAGNOSIS — Z01818 Encounter for other preprocedural examination: Secondary | ICD-10-CM | POA: Diagnosis not present

## 2022-11-03 LAB — BASIC METABOLIC PANEL
Anion gap: 10 (ref 5–15)
BUN: 15 mg/dL (ref 8–23)
CO2: 23 mmol/L (ref 22–32)
Calcium: 9.3 mg/dL (ref 8.9–10.3)
Chloride: 105 mmol/L (ref 98–111)
Creatinine, Ser: 0.71 mg/dL (ref 0.44–1.00)
GFR, Estimated: >60 mL/min (ref >60)
Glucose, Bld: 107 mg/dL — ABNORMAL HIGH (ref 70–99)
Potassium: 4.2 mmol/L (ref 3.5–5.1)
Sodium: 138 mmol/L (ref 135–145)

## 2022-11-03 LAB — CBC
HCT: 40.7 % (ref 36.0–46.0)
Hemoglobin: 13 g/dL (ref 12.0–15.0)
MCH: 29.5 pg (ref 26.0–34.0)
MCHC: 31.9 g/dL (ref 30.0–36.0)
MCV: 92.3 fL (ref 80.0–100.0)
Platelets: 316 K/uL (ref 150–400)
RBC: 4.41 MIL/uL (ref 3.87–5.11)
RDW: 13.7 % (ref 11.5–15.5)
WBC: 8 K/uL (ref 4.0–10.5)
nRBC: 0 % (ref 0.0–0.2)

## 2022-11-07 NOTE — Anesthesia Preprocedure Evaluation (Signed)
Anesthesia Evaluation  Patient identified by MRN, date of birth, ID band Patient awake    Reviewed: Allergy & Precautions, H&P , NPO status , Patient's Chart, lab work & pertinent test results  Airway Mallampati: II  TM Distance: >3 FB Neck ROM: Full    Dental no notable dental hx. (+) Teeth Intact, Dental Advisory Given   Pulmonary sleep apnea    Pulmonary exam normal breath sounds clear to auscultation       Cardiovascular Exercise Tolerance: Good hypertension, Pt. on medications  Rhythm:Regular Rate:Normal     Neuro/Psych negative neurological ROS  negative psych ROS   GI/Hepatic Neg liver ROS, hiatal hernia,GERD  Medicated,,  Endo/Other  Hypothyroidism    Renal/GU negative Renal ROS  negative genitourinary   Musculoskeletal  (+) Arthritis ,    Abdominal   Peds  Hematology  (+) Blood dyscrasia, anemia   Anesthesia Other Findings   Reproductive/Obstetrics negative OB ROS                             Anesthesia Physical Anesthesia Plan  ASA: 2  Anesthesia Plan: General   Post-op Pain Management: Tylenol PO (pre-op)*   Induction: Intravenous  PONV Risk Score and Plan: 4 or greater and Ondansetron, Dexamethasone and Treatment may vary due to age or medical condition  Airway Management Planned: Oral ETT  Additional Equipment:   Intra-op Plan:   Post-operative Plan: Extubation in OR  Informed Consent: I have reviewed the patients History and Physical, chart, labs and discussed the procedure including the risks, benefits and alternatives for the proposed anesthesia with the patient or authorized representative who has indicated his/her understanding and acceptance.     Dental advisory given  Plan Discussed with: CRNA  Anesthesia Plan Comments:        Anesthesia Quick Evaluation

## 2022-11-08 ENCOUNTER — Ambulatory Visit (HOSPITAL_BASED_OUTPATIENT_CLINIC_OR_DEPARTMENT_OTHER)
Admission: RE | Admit: 2022-11-08 | Discharge: 2022-11-09 | Disposition: A | Discharge status: Home / Self Care | Payer: Medicare PPO | Attending: Obstetrics and Gynecology | Admitting: Obstetrics and Gynecology

## 2022-11-08 ENCOUNTER — Encounter (HOSPITAL_BASED_OUTPATIENT_CLINIC_OR_DEPARTMENT_OTHER): Payer: Self-pay | Admitting: Obstetrics and Gynecology

## 2022-11-08 ENCOUNTER — Ambulatory Visit (HOSPITAL_BASED_OUTPATIENT_CLINIC_OR_DEPARTMENT_OTHER): Payer: Medicare PPO | Admitting: Anesthesiology

## 2022-11-08 ENCOUNTER — Other Ambulatory Visit: Payer: Self-pay

## 2022-11-08 ENCOUNTER — Ambulatory Visit (HOSPITAL_COMMUNITY): Payer: Medicare PPO

## 2022-11-08 ENCOUNTER — Encounter (HOSPITAL_BASED_OUTPATIENT_CLINIC_OR_DEPARTMENT_OTHER)
Admission: RE | Disposition: A | Discharge status: Home / Self Care | Payer: Self-pay | Source: Home / Self Care | Attending: Obstetrics and Gynecology

## 2022-11-08 DIAGNOSIS — N393 Stress incontinence (female) (male): Secondary | ICD-10-CM

## 2022-11-08 DIAGNOSIS — K219 Gastro-esophageal reflux disease without esophagitis: Secondary | ICD-10-CM | POA: Insufficient documentation

## 2022-11-08 DIAGNOSIS — Z79890 Hormone replacement therapy (postmenopausal): Secondary | ICD-10-CM | POA: Diagnosis not present

## 2022-11-08 DIAGNOSIS — G4730 Sleep apnea, unspecified: Secondary | ICD-10-CM | POA: Diagnosis not present

## 2022-11-08 DIAGNOSIS — Z408 Encounter for other prophylactic surgery: Secondary | ICD-10-CM | POA: Diagnosis not present

## 2022-11-08 DIAGNOSIS — N812 Incomplete uterovaginal prolapse: Secondary | ICD-10-CM | POA: Diagnosis not present

## 2022-11-08 DIAGNOSIS — D259 Leiomyoma of uterus, unspecified: Secondary | ICD-10-CM | POA: Diagnosis not present

## 2022-11-08 DIAGNOSIS — I10 Essential (primary) hypertension: Secondary | ICD-10-CM | POA: Insufficient documentation

## 2022-11-08 DIAGNOSIS — N72 Inflammatory disease of cervix uteri: Secondary | ICD-10-CM | POA: Diagnosis not present

## 2022-11-08 DIAGNOSIS — Z01818 Encounter for other preprocedural examination: Secondary | ICD-10-CM

## 2022-11-08 DIAGNOSIS — E039 Hypothyroidism, unspecified: Secondary | ICD-10-CM | POA: Insufficient documentation

## 2022-11-08 DIAGNOSIS — Z79899 Other long term (current) drug therapy: Secondary | ICD-10-CM | POA: Insufficient documentation

## 2022-11-08 DIAGNOSIS — M810 Age-related osteoporosis without current pathological fracture: Secondary | ICD-10-CM | POA: Diagnosis not present

## 2022-11-08 DIAGNOSIS — N858 Other specified noninflammatory disorders of uterus: Secondary | ICD-10-CM | POA: Diagnosis not present

## 2022-11-08 DIAGNOSIS — Z01812 Encounter for preprocedural laboratory examination: Secondary | ICD-10-CM

## 2022-11-08 DIAGNOSIS — S3710XA Unspecified injury of ureter, initial encounter: Secondary | ICD-10-CM | POA: Diagnosis not present

## 2022-11-08 DIAGNOSIS — R7303 Prediabetes: Secondary | ICD-10-CM | POA: Diagnosis not present

## 2022-11-08 DIAGNOSIS — N838 Other noninflammatory disorders of ovary, fallopian tube and broad ligament: Secondary | ICD-10-CM | POA: Diagnosis not present

## 2022-11-08 HISTORY — PX: CYSTOCELE REPAIR: SHX163

## 2022-11-08 HISTORY — PX: CYSTOSCOPY: SHX5120

## 2022-11-08 HISTORY — DX: Prediabetes: R73.03

## 2022-11-08 HISTORY — PX: VAGINAL PROLAPSE REPAIR: SHX830

## 2022-11-08 HISTORY — PX: BLADDER SUSPENSION: SHX72

## 2022-11-08 HISTORY — DX: Personal history of other diseases of the digestive system: Z87.19

## 2022-11-08 HISTORY — PX: VAGINAL HYSTERECTOMY: SHX2639

## 2022-11-08 LAB — TYPE AND SCREEN
ABO/RH(D): O POS
Antibody Screen: NEGATIVE

## 2022-11-08 SURGERY — HYSTERECTOMY, VAGINAL
Anesthesia: General | Site: Vagina

## 2022-11-08 MED ORDER — FENTANYL CITRATE (PF) 100 MCG/2ML IJ SOLN
INTRAMUSCULAR | Status: AC
  Filled 2022-11-08: qty 2

## 2022-11-08 MED ORDER — EPHEDRINE 5 MG/ML INJ
INTRAVENOUS | Status: AC
  Filled 2022-11-08: qty 5

## 2022-11-08 MED ORDER — ROCURONIUM BROMIDE 10 MG/ML (PF) SYRINGE
PREFILLED_SYRINGE | INTRAVENOUS | Status: DC | PRN
  Administered 2022-11-08: 50 mg via INTRAVENOUS
  Administered 2022-11-08 (×2): 20 mg via INTRAVENOUS
  Administered 2022-11-08: 10 mg via INTRAVENOUS

## 2022-11-08 MED ORDER — FLUORESCEIN SODIUM 10 % IV SOLN
INTRAVENOUS | Status: AC
  Filled 2022-11-08: qty 5

## 2022-11-08 MED ORDER — FUROSEMIDE 10 MG/ML IJ SOLN
INTRAMUSCULAR | Status: AC
  Filled 2022-11-08: qty 2

## 2022-11-08 MED ORDER — SODIUM CHLORIDE 0.9 % IR SOLN
Status: DC | PRN
  Administered 2022-11-08 (×3): 1000 mL via INTRAVESICAL

## 2022-11-08 MED ORDER — ACETAMINOPHEN 500 MG PO TABS
ORAL_TABLET | ORAL | Status: AC
  Filled 2022-11-08: qty 2

## 2022-11-08 MED ORDER — ONDANSETRON HCL 4 MG PO TABS: 4.0000 mg | ORAL_TABLET | Freq: Four times a day (QID) | ORAL | Status: DC | PRN

## 2022-11-08 MED ORDER — OXYCODONE HCL 5 MG PO TABS
ORAL_TABLET | ORAL | Status: AC
  Filled 2022-11-08: qty 1

## 2022-11-08 MED ORDER — ENOXAPARIN SODIUM 40 MG/0.4ML IJ SOSY
PREFILLED_SYRINGE | INTRAMUSCULAR | Status: AC
  Filled 2022-11-08: qty 0.4

## 2022-11-08 MED ORDER — PHENYLEPHRINE 80 MCG/ML (10ML) SYRINGE FOR IV PUSH (FOR BLOOD PRESSURE SUPPORT)
PREFILLED_SYRINGE | INTRAVENOUS | Status: AC
  Filled 2022-11-08: qty 10

## 2022-11-08 MED ORDER — HYDROMORPHONE HCL 1 MG/ML IJ SOLN
INTRAMUSCULAR | Status: AC
  Filled 2022-11-08: qty 1

## 2022-11-08 MED ORDER — GABAPENTIN 300 MG PO CAPS
300.0000 mg | ORAL_CAPSULE | ORAL | Status: AC
  Administered 2022-11-08: 300 mg via ORAL

## 2022-11-08 MED ORDER — DEXAMETHASONE SODIUM PHOSPHATE 10 MG/ML IJ SOLN
INTRAMUSCULAR | Status: DC | PRN
  Administered 2022-11-08: 5 mg via INTRAVENOUS

## 2022-11-08 MED ORDER — GABAPENTIN 300 MG PO CAPS
ORAL_CAPSULE | ORAL | Status: AC
  Filled 2022-11-08: qty 1

## 2022-11-08 MED ORDER — IBUPROFEN 200 MG PO TABS
ORAL_TABLET | ORAL | Status: AC
  Filled 2022-11-08: qty 3

## 2022-11-08 MED ORDER — LIDOCAINE 2% (20 MG/ML) 5 ML SYRINGE
INTRAMUSCULAR | Status: DC | PRN
  Administered 2022-11-08: 60 mg via INTRAVENOUS

## 2022-11-08 MED ORDER — LACTATED RINGERS IV SOLN
INTRAVENOUS | Status: DC
  Administered 2022-11-08: 1 mL via INTRAVENOUS

## 2022-11-08 MED ORDER — SIMETHICONE 80 MG PO CHEW: 80.0000 mg | CHEWABLE_TABLET | Freq: Four times a day (QID) | ORAL | Status: DC | PRN

## 2022-11-08 MED ORDER — ACETAMINOPHEN 500 MG PO TABS
1000.0000 mg | ORAL_TABLET | ORAL | Status: AC
  Administered 2022-11-08: 1000 mg via ORAL

## 2022-11-08 MED ORDER — PHENAZOPYRIDINE HCL 100 MG PO TABS
ORAL_TABLET | ORAL | Status: AC
  Filled 2022-11-08: qty 2

## 2022-11-08 MED ORDER — ONDANSETRON HCL 4 MG/2ML IJ SOLN: 4.0000 mg | Freq: Four times a day (QID) | INTRAMUSCULAR | Status: DC | PRN

## 2022-11-08 MED ORDER — ROCURONIUM BROMIDE 10 MG/ML (PF) SYRINGE
PREFILLED_SYRINGE | INTRAVENOUS | Status: AC
  Filled 2022-11-08: qty 10

## 2022-11-08 MED ORDER — LIDOCAINE-EPINEPHRINE 1 %-1:100000 IJ SOLN
INTRAMUSCULAR | Status: DC | PRN
  Administered 2022-11-08: 30 mL

## 2022-11-08 MED ORDER — CEFAZOLIN SODIUM-DEXTROSE 2-4 GM/100ML-% IV SOLN
INTRAVENOUS | Status: AC
  Filled 2022-11-08: qty 100

## 2022-11-08 MED ORDER — ACETAMINOPHEN 500 MG PO TABS: 1000.0000 mg | ORAL_TABLET | Freq: Once | ORAL | Status: DC

## 2022-11-08 MED ORDER — SUGAMMADEX SODIUM 200 MG/2ML IV SOLN
INTRAVENOUS | Status: DC | PRN
  Administered 2022-11-08: 150 mg via INTRAVENOUS

## 2022-11-08 MED ORDER — HYDROMORPHONE HCL 1 MG/ML IJ SOLN
0.2500 mg | INTRAMUSCULAR | Status: DC | PRN
  Administered 2022-11-08 (×2): 0.25 mg via INTRAVENOUS

## 2022-11-08 MED ORDER — EPHEDRINE SULFATE-NACL 50-0.9 MG/10ML-% IV SOSY
PREFILLED_SYRINGE | INTRAVENOUS | Status: DC | PRN
  Administered 2022-11-08: 10 mg via INTRAVENOUS
  Administered 2022-11-08: 5 mg via INTRAVENOUS
  Administered 2022-11-08: 2.5 mg via INTRAVENOUS

## 2022-11-08 MED ORDER — 0.9 % SODIUM CHLORIDE (POUR BTL) OPTIME
TOPICAL | Status: DC | PRN
  Administered 2022-11-08: 1000 mL

## 2022-11-08 MED ORDER — FENTANYL CITRATE (PF) 100 MCG/2ML IJ SOLN
INTRAMUSCULAR | Status: DC | PRN
  Administered 2022-11-08: 25 ug via INTRAVENOUS
  Administered 2022-11-08 (×2): 50 ug via INTRAVENOUS

## 2022-11-08 MED ORDER — DIPHENHYDRAMINE HCL 50 MG/ML IJ SOLN
INTRAMUSCULAR | Status: AC
  Filled 2022-11-08: qty 1

## 2022-11-08 MED ORDER — KETOROLAC TROMETHAMINE 30 MG/ML IJ SOLN
INTRAMUSCULAR | Status: DC | PRN
  Administered 2022-11-08: 30 mg via INTRAVENOUS

## 2022-11-08 MED ORDER — LIDOCAINE-EPINEPHRINE 1 %-1:100000 IJ SOLN
INTRAMUSCULAR | Status: AC
  Filled 2022-11-08: qty 1

## 2022-11-08 MED ORDER — ONDANSETRON HCL 4 MG/2ML IJ SOLN
INTRAMUSCULAR | Status: DC | PRN
  Administered 2022-11-08: 4 mg via INTRAVENOUS

## 2022-11-08 MED ORDER — FLUORESCEIN SODIUM 10 % IV SOLN
INTRAVENOUS | Status: DC | PRN
  Administered 2022-11-08: 25 mg via INTRAVENOUS

## 2022-11-08 MED ORDER — PHENYLEPHRINE 80 MCG/ML (10ML) SYRINGE FOR IV PUSH (FOR BLOOD PRESSURE SUPPORT)
PREFILLED_SYRINGE | INTRAVENOUS | Status: DC | PRN
  Administered 2022-11-08: 160 ug via INTRAVENOUS
  Administered 2022-11-08 (×3): 80 ug via INTRAVENOUS
  Administered 2022-11-08 (×2): 160 ug via INTRAVENOUS
  Administered 2022-11-08: 120 ug via INTRAVENOUS
  Administered 2022-11-08 (×2): 160 ug via INTRAVENOUS
  Administered 2022-11-08 (×2): 80 ug via INTRAVENOUS
  Administered 2022-11-08: 160 ug via INTRAVENOUS
  Administered 2022-11-08: 80 ug via INTRAVENOUS

## 2022-11-08 MED ORDER — CEFAZOLIN SODIUM-DEXTROSE 2-4 GM/100ML-% IV SOLN
2.0000 g | INTRAVENOUS | Status: AC
  Administered 2022-11-08: 2 g via INTRAVENOUS

## 2022-11-08 MED ORDER — PROPOFOL 10 MG/ML IV BOLUS
INTRAVENOUS | Status: AC
  Filled 2022-11-08: qty 20

## 2022-11-08 MED ORDER — OXYCODONE HCL 5 MG PO TABS
5.0000 mg | ORAL_TABLET | ORAL | Status: DC | PRN
  Administered 2022-11-08 – 2022-11-09 (×3): 5 mg via ORAL

## 2022-11-08 MED ORDER — PHENAZOPYRIDINE HCL 100 MG PO TABS
200.0000 mg | ORAL_TABLET | ORAL | Status: AC
  Administered 2022-11-08: 200 mg via ORAL

## 2022-11-08 MED ORDER — LIDOCAINE HCL (PF) 2 % IJ SOLN
INTRAMUSCULAR | Status: AC
  Filled 2022-11-08: qty 5

## 2022-11-08 MED ORDER — PROPOFOL 10 MG/ML IV BOLUS
INTRAVENOUS | Status: DC | PRN
  Administered 2022-11-08: 110 mg via INTRAVENOUS

## 2022-11-08 MED ORDER — ACETAMINOPHEN 325 MG PO TABS: 650.0000 mg | ORAL_TABLET | ORAL | Status: DC | PRN

## 2022-11-08 MED ORDER — ENOXAPARIN SODIUM 40 MG/0.4ML IJ SOSY
40.0000 mg | PREFILLED_SYRINGE | INTRAMUSCULAR | Status: AC
  Administered 2022-11-08: 40 mg via SUBCUTANEOUS

## 2022-11-08 MED ORDER — IBUPROFEN 200 MG PO TABS
600.0000 mg | ORAL_TABLET | Freq: Four times a day (QID) | ORAL | Status: DC
  Administered 2022-11-08 – 2022-11-09 (×3): 600 mg via ORAL

## 2022-11-08 MED ORDER — FUROSEMIDE 10 MG/ML IJ SOLN
INTRAMUSCULAR | Status: DC | PRN
  Administered 2022-11-08: 5 mg via INTRAMUSCULAR

## 2022-11-08 SURGICAL SUPPLY — 53 items
ADH SKN CLS APL DERMABOND .7 (GAUZE/BANDAGES/DRESSINGS) ×2
AGENT HMST KT MTR STRL THRMB (HEMOSTASIS)
BLADE CLIPPER SENSICLIP SURGIC (BLADE) ×2 IMPLANT
BLADE SURG 15 STRL LF DISP TIS (BLADE) ×2 IMPLANT
BLADE SURG 15 STRL SS (BLADE) ×2
COVER MAYO STAND STRL (DRAPES) IMPLANT
DERMABOND ADVANCED .7 DNX12 (GAUZE/BANDAGES/DRESSINGS) ×2 IMPLANT
DEVICE CAPIO SLIM SINGLE (INSTRUMENTS) IMPLANT
ELECT REM PT RETURN 9FT ADLT (ELECTROSURGICAL)
ELECTRODE REM PT RTRN 9FT ADLT (ELECTROSURGICAL) IMPLANT
GAUZE 4X4 16PLY ~~LOC~~+RFID DBL (SPONGE) ×2 IMPLANT
GLOVE BIOGEL PI IND STRL 6.5 (GLOVE) ×2 IMPLANT
GLOVE BIOGEL PI IND STRL 7.0 (GLOVE) ×2 IMPLANT
GLOVE ECLIPSE 6.0 STRL STRAW (GLOVE) ×2 IMPLANT
GOWN STRL REUS W/TWL LRG LVL3 (GOWN DISPOSABLE) ×2 IMPLANT
GUIDEWIRE ANG ZIPWIRE 038X150 (WIRE) IMPLANT
GUIDEWIRE STR DUAL SENSOR (WIRE) IMPLANT
HIBICLENS CHG 4% 4OZ BTL (MISCELLANEOUS) ×2 IMPLANT
HOLDER FOLEY CATH W/STRAP (MISCELLANEOUS) ×2 IMPLANT
KIT TURNOVER CYSTO (KITS) ×2 IMPLANT
LIGASURE IMPACT 36 18CM CVD LR (INSTRUMENTS) ×2 IMPLANT
MANIFOLD NEPTUNE II (INSTRUMENTS) ×2 IMPLANT
NDL HYPO 22X1.5 SAFETY MO (MISCELLANEOUS) ×2 IMPLANT
NDL MAYO 6 CRC TAPER PT (NEEDLE) IMPLANT
NEEDLE HYPO 22X1.5 SAFETY MO (MISCELLANEOUS) ×2 IMPLANT
NEEDLE MAYO 6 CRC TAPER PT (NEEDLE) IMPLANT
NS IRRIG 1000ML POUR BTL (IV SOLUTION) ×2 IMPLANT
PACK CYSTO (CUSTOM PROCEDURE TRAY) ×2 IMPLANT
PACK VAGINAL WOMENS (CUSTOM PROCEDURE TRAY) ×2 IMPLANT
PAD OB MATERNITY 4.3X12.25 (PERSONAL CARE ITEMS) ×2 IMPLANT
RETRACTOR LONE STAR DISPOSABLE (INSTRUMENTS) ×2 IMPLANT
RETRACTOR STAY HOOK 5MM (MISCELLANEOUS) ×2 IMPLANT
SET IRRIG Y TYPE TUR BLADDER L (SET/KITS/TRAYS/PACK) ×2 IMPLANT
SLEEVE SCD COMPRESS KNEE MED (STOCKING) ×2 IMPLANT
SPIKE FLUID TRANSFER (MISCELLANEOUS) ×2 IMPLANT
SPONGE T-LAP 18X36 ~~LOC~~+RFID STR (SPONGE) ×2 IMPLANT
SUCTION FRAZIER HANDLE 10FR (MISCELLANEOUS) ×2
SUCTION TUBE FRAZIER 10FR DISP (MISCELLANEOUS) ×2 IMPLANT
SURGIFLO W/THROMBIN 8M KIT (HEMOSTASIS) IMPLANT
SUT ABS MONO DBL WITH NDL 48IN (SUTURE) IMPLANT
SUT PDS PLUS AB 0 CT-2 (SUTURE) ×8 IMPLANT
SUT VIC AB 0 CT1 27 (SUTURE) ×4
SUT VIC AB 0 CT1 27XBRD ANBCTR (SUTURE) IMPLANT
SUT VIC AB 0 CT1 27XCR 8 STRN (SUTURE) ×4 IMPLANT
SUT VIC AB 2-0 SH 27 (SUTURE) ×2
SUT VIC AB 2-0 SH 27XBRD (SUTURE) ×2 IMPLANT
SUT VICRYL 2-0 SH 8X27 (SUTURE) ×2 IMPLANT
SYR 10ML LL (SYRINGE) IMPLANT
SYR BULB EAR ULCER 3OZ GRN STR (SYRINGE) ×2 IMPLANT
SYS SLING ADV FIT BLUE TRNSVAG (Sling) IMPLANT
TOWEL OR 17X24 6PK STRL BLUE (TOWEL DISPOSABLE) ×2 IMPLANT
TRAY FOLEY W/BAG SLVR 14FR LF (SET/KITS/TRAYS/PACK) ×2 IMPLANT
UNDERPAD 30X36 HEAVY ABSORB (UNDERPADS AND DIAPERS) ×2 IMPLANT

## 2022-11-08 NOTE — Interval H&P Note (Signed)
History and Physical Interval Note:  11/08/2022 7:19 AM  Stacey Coleman  has presented today for surgery, with the diagnosis of anterior vaginal prolapse; uterovaginal prolapse incomplete; stress urinary incontinence.  The various methods of treatment have been discussed with the patient and family. After consideration of risks, benefits and other options for treatment, the patient has consented to  Procedure(s): HYSTERECTOMY VAGINAL with possible bilateral salpingo-oophorectomy (N/A) VAGINAL VAULT SUSPENSION (N/A) ANTERIOR REPAIR (CYSTOCELE) (N/A) TRANSVAGINAL TAPE (TVT) PROCEDURE (N/A) CYSTOSCOPY (N/A) as a surgical intervention.  The patient's history has been reviewed, patient examined, no change in status, stable for surgery.  I have reviewed the patient's chart and labs.  Questions were answered to the patient's satisfaction.     Marguerita Beards

## 2022-11-08 NOTE — Anesthesia Postprocedure Evaluation (Signed)
Anesthesia Post Note  Patient: Stacey Coleman  Procedure(s) Performed: HYSTERECTOMY VAGINAL with possible bilateral salpingo-oophorectomy (Vagina ) VAGINAL VAULT SUSPENSION (Vagina ) ANTERIOR REPAIR (CYSTOCELE) (Vagina ) TRANSVAGINAL TAPE (TVT) PROCEDURE (Vagina ) CYSTOSCOPY (Bladder)     Patient location during evaluation: PACU Anesthesia Type: General Level of consciousness: awake and alert Pain management: pain level controlled Vital Signs Assessment: post-procedure vital signs reviewed and stable Respiratory status: spontaneous breathing, nonlabored ventilation and respiratory function stable Cardiovascular status: blood pressure returned to baseline and stable Postop Assessment: no apparent nausea or vomiting Anesthetic complications: no  No notable events documented.  Last Vitals:  Vitals:   11/08/22 1245 11/08/22 1330  BP: 125/74 118/72  Pulse: 88 81  Resp: 14 15  Temp:  36.6 C  SpO2: 97% 95%    Last Pain:  Vitals:   11/08/22 1330  TempSrc:   PainSc: 2                  Palmer Fahrner,W. EDMOND

## 2022-11-08 NOTE — Op Note (Signed)
Operative Note  Preoperative Diagnosis: anterior vaginal prolapse, uterovaginal prolapse, incomplete, and stress urinary incontinence  Postoperative Diagnosis: same  Procedures performed:  Total vaginal hysterectomy with bilateral salpingo-oophorectomy, uterosacral ligament suspension, anterior repair, midurethral sling, cystoscopy with retrograde pyelogram  Implants:  Implant Name Type Inv. Item Serial No. Manufacturer Lot No. LRB No. Used Action  SYS SLING ADV FIT BLUE TRNSVAG - [id number] Sling SYS SLING ADV FIT BLUE TRNSVAG  Norvel Richards [id number] N/A 1 Implanted    Attending Surgeon: Lanetta Inch, MD  Consulting surgeon: Rhoderick Moody, MD (Urology)  Anesthesia: General endotracheal  Findings: 1. On vaginal exam, stage II prolapse noted  2. On cystoscopy, normal appearing urethral and bladder mucosa. At the end of the case, bilateral ureteral efflux noted.     Specimens:  ID Type Source Tests Collected by Time Destination  1 : uterus, cervix, bilateral fallopian tubes and ovaries Tissue PATH Gyn benign resection SURGICAL PATHOLOGY Marguerita Beards, MD 11/08/2022 [phone]     Estimated blood loss: 50 mL  IV fluids: 1500 mL  Urine output: 800 mL  Complications: Efflux not noted from the left ureter. Urology consulted and retrograde pyelogram performed which showed that the ureter was intact. At the end of the case, efflux was noted from both ureters.  Placement of uterosacral sutures and evaluation of ureteral efflux took significantly more time (approximately 90 min additional) than a typical case.   Procedure in Detail:  After informed consent was obtained, the patient was taken to the operating room where she was placed under anesthesia.  She was then placed in the dorsal lithotomy position with Allen stirrups and prepped and draped in the usual sterile fashion.  Care was taken to avoid hyperflexion or hyperextension of her lower extremities.    A  self-retaining retractor was placed, and a foley catheter was placed. The cervix was grasped with two tenacula.  The cervix was injected circumferentially with 1% lidocaine with epinephrine. A 10 blade was used to incise circumferentially around the cervix.  The posterior vagina was grasped with a Kocher clamp. The Mayo scissors were used to enter the posterior cul-de-sac. Palpation confirmed peritoneal entry and no adhesions. The posterior peritoneum was affixed to the vaginal cuff with 0-Vicryl (this suture was used throughout unless otherwise specified) at the midline. A right angle retractor was placed through the posterior colpotomy. A Heaney clamp was then used to clamp the uterosacral ligaments on each side. These were cut and suture ligated using 0-Vicryl in a Heaney fashion, and tagged with hemostats. Anteriorly, the bladder was dissected off the pubocervical fascia using Metzenbaum scissors. A Deaver retractor was placed anteriorly to protect the bladder. The anterior peritoneal reflection was then identified, tented up and incised with Metzenbaum scissors to create an anterior colpotomy. Palpation confirmed peritoneal entry and no adhesions. The Deaver was placed anteriorly to protect the bladder. The cardinal ligaments were clamped, ligated and cut with the ligasure in a similar fashion on each side. The uterine arteries were also clamped and ligated with the ligasure. The cornua were clamped, cut, free-tied and suture ligated. The uterus and cervix were handed off the field.  Inspection of the pedicles revealed excellent hemostasis.  The right fallopian tube and ovary was grapsed with a Babcock clamp. The IP was then cauterized and ligated with the ligasure and the ovary and fallopian tube was removed. This was repeated on the left side.  For the uterosacral ligament suspension (USLS), the bowel was packed away with a moistened lap  pad. The posterior cuff edge was grasped with an Allis clamp.  The  right and left uterosacral ligaments were identified visually and digitally. Two stitches of 0 PDS was placed through each uterosacral ligament towards its insertion site at the sacrum. These were tagged.. The packing was removed.  A 70-degree cystoscope was introduced, and 360-degree inspection revealed no trauma in the bladder, and only good efflux was noted on the right ureter with tension on the uterosacral sutures. Without tension, still no efflux was noted from the left ureter. Fluorocein was given IV to better visualize the urine and she was also given  lasix IV. After waiting approximately 20 min, decision was made to place an open ended stent into the left side. The scope was switched to 30 degrees. A 54F open ended stent was passed through the scope. A guidewire was placed through the stent and into the ureter with no resistance. The open ended catheter was easily placed through the ureter up to 20cm and removed. Still no efflux was noted so decision was made to perform retrograde imaging. Fluoroscopy was called, and attempt was made to repass the stent, however, then encountered resistance. Urology was called for a consult and Dr Liliane Shi was able to pass a guidewire and stent without difficulty. Retrograde pyleogram was performed by Dr Liliane Shi and the ureter was noted to be intact. The stent and cystoscope was removed.  The Foley catheter was reinserted.  Uterosacral stitches were removed from the left to ensure no kinking of the ureter on the left.   For the anterior repair, two Allis clamps were placed along the midline of the anterior vaginal wall.  1% lidocaine with epinephrine was injected into the vaginal mucosa.  A 15 blade scalpel was used to incise the vaginal mucosa in the midline. Allis clamps were placed along this incision and Metzenbaum scissors were used to sharply dissect the epithelium off of the vesicovaginal septum bilaterally to the level of the pubic rami. Anterior plication of the  vesicovaginal septum was then performed using 2-0 Vicryl. The vaginal mucosal edges were trimmed and the incision reapproximated with 2-0 Vicryl in a running locked fashion. Hemostasis was noted. The uterosacral stitches were then attached to the posterior and anterior edges of the vaginal cuff on the ipsilateral sides, in a through and through fashion, using a free needle. Figure of eight sutures of 0-Vicryl were placed through the remainder of the vaginal cuff and tied down. The lateral uterosacral stitch were then tied down with good apical support noted. The Foley catheter was removed. A 70-degree cystoscope was introduced, and 360-degree inspection revealed no trauma in the bladder, with bilateral ureteral efflux. The cystoscope was removed. The medial uterosacral suture was then tied down. Cystoscopy was repeated but no efflux was noted from the left. The cystoscope was removed, the left medial stitch was removed. The cystoscope was replaced and good efflux was noted on the right. The bladder was drained and the cystoscope was removed.  The Foley catheter was reinserted.  Attention was turned to the sling. The mid urethral area was located on the anterior vaginal wall.  Two Allis clamps were placed at the level of the midurethra. 1% lidocaine with epinephrine was injected into the vaginal mucosa. A vertical incision was made between the two clamps using a 15-blade scalpel.  Using sharp dissection, Metzenbaum scissors were used to make a periurethral tunnel from the vaginal incision towards the pubic rami bilaterally for the future sling tracts. The bladder was  ensured to be empty. The trocar and attached sling were introduced into the right side of the periurethral vaginal incision, just inferior to the pubic symphysis on the right side. The trocar was guided through the endopelvic fascia and directly vertically.  While hugging the cephalad surface of the pubic bone, the trocar was guided out through the  abdomen 2 fingerbreadths lateral to midline at the level of the pubic symphysis on the ipsilateral side. The trocar was placed on the left side in a similar fashion.  A 70-degree cystoscope was introduced, and 360-degree inspection revealed no trauma or trocars in the bladder, with brisk bilateral ureteral efflux.  The bladder was drained and the cystoscope was removed.  The Foley catheter was reinserted.  The sling was brought to lie beneath the mid-urethra.  A needle driver was placed behind the sling to ensure no tension.   The plastic sheath was removed from the sling and the distal ends of the sling were trimmed just below the level of the skin incisions.  Tension-free positioning of the sling was confirmed. Vaginal inspection revealed no vaginotomy or sling perforations of the mucosa.  The vaginal mucosal edges were reapproximated using 2-0 Vicryl.  The suprapubic sling incisions were closed with Dermabond.  The vagina was copiously irrigated.  Hemostasis was noted. A rectal examination was normal and confirmed no sutures within the rectum. She was awakened from anesthesia and transferred to the recovery room in stable condition. All counts were correct x 2.    Marguerita Beards, MD

## 2022-11-08 NOTE — Op Note (Signed)
Operative Note  Preoperative diagnosis:  1.  Possible left ureteral injury  Postoperative diagnosis: 1.  No evidence of left ureteral injury   Procedure(s): 1.  Cystoscopy 2.  Left retrograde Pyelogram with intra-operative interpretation of fluoroscopic imaging   Surgeon: Rhoderick Moody, MD  Assistants:  None  Anesthesia:  General  Complications:  None  EBL:  less than 5 mL   Specimens: 1. none  Drains/Catheters: 1.  none  Intraoperative findings:   Normal urethra and bladder Solitary left collecting system with no filling defects or dilation involving the left ureter or left renal pelvis seen on retrograde pyelogram Both ureters were effluxing fluoroscene    Indication:  Stacey Coleman is a 72 y.o. female s/p vaginal hysterectomy, BSO and MUS with Dr. Florian Buff.  There was concern for a possible left ureteral injury after no urine was seen effluxing from the left UO, which prompted an intraoperative urologic consultation.   Description of procedure:  Informed consent could not be obtained as the patient was already under general anesthesia.  A 21 French rigid cystoscope was then advanced into the bladder and a complete bladder survey revealed no intravesical or urethral abnormalities.  A Glidewire was then advanced up the left ureter with no resistance, confirming placement via fluoroscopy.  A 5 French ureteral catheter was then advanced up the wire and into the distal aspects of the left ureter.  A left retrograde pyelogram was obtained, with the findings listed above.  After removal of the left ureteral catheter, fluorescein can be seen effluxing from the left ureteral orifice.  The Glidewire was then removed.  Dr. Florian Buff then proceeded with the remaining portion of her case.  Plan:  Follow-up PRN

## 2022-11-08 NOTE — Consult Note (Signed)
Urology Consult   Physician requesting consult: Lanetta Inch, MD   Reason for consult: Possible ureteral injury  History of Present Illness: Stacey Coleman is a 72 y.o. female who is currently under general anesthesia undergoing a vaginal hysterectomy with BSO and mid-urethral sling placement with Dr. Florian Buff.  There was concern about the left ureter not effluxing urine and urologic consultation was requested.    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 Tally Joe @ [place].   Hypothyroid    Follows w/ PCP, Dr. Tally Joe @ [place].   Laryngotracheal cleft    laryngotracheal reflux   Nasal congestion    nasal dripping   Osteoporosis    Follows w/ Dr. Cleophas Dunker, orthopedics.   Pre-diabetes    Follows w/ PCP. No meds.   Seasonal allergies    Sleep apnea    mild, no CPAP   Wears hearing aid    both ears   Wears partial dentures     Past Surgical History:  Procedure Laterality Date   ANKLE ARTHROSCOPY  2008   spurs-left   BONE RESECTION  2004   left humerous   CLOSED REDUCTION FINGER WITH PERCUTANEOUS PINNING Right 10/30/2020   Procedure: CLOSED REDUCTION RIGHT SMALL PROXIMAL PHALANX FRACTURE OF  FINGER WITH PERCUTANEOUS PINNING;  Surgeon: Betha Loa, MD;  Location: [facility];  Service: Orthopedics;  Laterality: Right;   COLONOSCOPY     ESOPHAGOGASTRODUODENOSCOPY     hiatal hernia   KNEE ARTHROSCOPY  1990   rt   KNEE ARTHROSCOPY  2002   rt   KNEE ARTHROSCOPY W/ ACL RECONSTRUCTION  2001   right   KNEE ARTHROSCOPY WITH MEDIAL MENISECTOMY Left 12/13/2013    Procedure: LEFT KNEE ARTHROSCOPY WITH MEDIAL MENISECTOMY, PLICA EXCISION, CHONDROPLASTY;  Surgeon: Loreta Ave, MD;  Location: [facility];  Service: Orthopedics;  Laterality: Left;   SHOULDER SURGERY Left 2004   Removed chondro sarcoma   TOTAL KNEE ARTHROPLASTY Right 08/28/2014   Procedure: RIGHT TOTAL KNEE ARTHROPLASTY;  Surgeon: Loreta Ave, MD;  Location: [facility] OR;  Service: Orthopedics;  Laterality: Right;    Current Hospital Medications:  Home Meds:  Current Meds  Medication Sig   amLODipine (NORVASC) 5 MG tablet Take 5 mg by mouth daily.   benazepril-hydrochlorthiazide (LOTENSIN HCT) 20-12.5 MG per tablet Take 1 tablet by mouth daily.   calcium carbonate (OSCAL) 1500 (600 Ca) MG TABS tablet Take by mouth 2 (two) times daily with a meal.   calcium carbonate (TUMS - DOSED IN MG ELEMENTAL CALCIUM) 500 MG chewable tablet Chew by mouth.   Cholecalciferol (VITAMIN D3) 2000 units capsule Take by mouth daily.   CINNAMON EXTRACT PO Take by mouth.   diclofenac Sodium (VOLTAREN) 1 % GEL diclofenac 1 % topical gel   fexofenadine (ALLEGRA) 180 MG tablet Take 180 mg by mouth as needed.   Lactobacillus-Inulin (CULTURELLE DIGESTIVE DAILY PO) Take by mouth.   levothyroxine (SYNTHROID, LEVOTHROID) 125 MCG tablet Take 125 mcg by mouth daily before breakfast.  Omega-3 1000 MG CAPS Take by mouth.   omeprazole (PRILOSEC) 20 MG capsule Take 20 mg by mouth daily.   Peppermint Oil (PEPOGEST PO) Take by mouth.   Propylene Glycol (SYSTANE BALANCE) 0.6 % SOLN Apply to eye.   Red Yeast Rice 600 MG CAPS Take by mouth.   triamcinolone (NASACORT) 55 MCG/ACT AERO nasal inhaler Place 2 sprays into both nostrils at bedtime.   vitamin k 100 MCG tablet Take 100 mcg by mouth daily.    Scheduled Meds:  ibuprofen  600 mg Oral Q6H   Continuous Infusions:  lactated ringers 1 mL (11/08/22 1354)   PRN Meds:.acetaminophen, HYDROmorphone (DILAUDID) injection, ondansetron **OR** ondansetron (ZOFRAN)  IV, oxyCODONE, simethicone  Allergies: No Known Allergies  Family History  Problem Relation Age of Onset   Breast cancer Mother    Heart attack Father    Hypertension Father    Diabetes Brother    Heart attack Maternal Grandfather    Colon cancer Paternal Grandfather    Diabetes Other     Social History:  reports that she has never smoked. She has never used smokeless tobacco. She reports current alcohol use. She reports that she does not use drugs.  ROS: Unable to be obtained.   Physical Exam:  Vital signs in last 24 hours: Temp:  [97.7 F (36.5 C)-98.2 F (36.8 C)] 97.7 F (36.5 C) (04/15 1400) Pulse Rate:  [79-98] 79 (04/15 1400) Resp:  [14-20] 14 (04/15 1400) BP: (118-154)/(66-83) 118/66 (04/15 1400) SpO2:  [94 %-98 %] 97 % (04/15 1400) Weight:  [65 kg] 65 kg (04/15 0548) Constitutional:  under general anesthesia   Laboratory Data:  No results for input(s): "WBC", "HGB", "HCT", "PLT" in the last 72 hours.  No results for input(s): "NA", "K", "CL", "GLUCOSE", "BUN", "CALCIUM", "CREATININE" in the last 72 hours.  Invalid input(s): "CO3"   No results found for this or any previous visit (from the past 24 hour(s)). No results found for this or any previous visit (from the past 240 hour(s)).  Renal Function: Recent Labs    11/03/22 1313  CREATININE 0.71   Estimated Creatinine Clearance: 51.3 mL/min (by C-G formula based on SCr of 0.71 mg/dL).  Radiologic Imaging: DG C-Arm 1-60 Min-No Report  Result Date: 11/08/2022 Fluoroscopy was utilized by the requesting physician.  No radiographic interpretation.   DG C-Arm 1-60 Min-No Report  Result Date: 11/08/2022 Fluoroscopy was utilized by the requesting physician.  No radiographic interpretation.    I independently reviewed the above imaging studies.  Impression/Recommendation 72 year old female s/p vaginal hysterectomy, BSO and MUS with Dr. Florian Buff  -No evidence of left ureteral injury seen during  cystoscopy and left RPG.  See op-note for more details  Rhoderick Moody, MD [facility] 11/08/2022, 2:56 PM

## 2022-11-08 NOTE — Transfer of Care (Signed)
Immediate Anesthesia Transfer of Care Note  Patient: Stacey Coleman  Procedure(s) Performed: HYSTERECTOMY VAGINAL with possible bilateral salpingo-oophorectomy (Vagina ) VAGINAL VAULT SUSPENSION (Vagina ) ANTERIOR REPAIR (CYSTOCELE) (Vagina ) TRANSVAGINAL TAPE (TVT) PROCEDURE (Vagina ) CYSTOSCOPY (Bladder)  Patient Location: PACU  Anesthesia Type:General  Level of Consciousness: awake, alert , and oriented  Airway & Oxygen Therapy: Patient Spontanous Breathing  Post-op Assessment: Report given to RN and Post -op Vital signs reviewed and stable  Post vital signs: Reviewed and stable  Last Vitals:  Vitals Value Taken Time  BP 125/71 11/08/22 1215  Temp 36.6 C 11/08/22 1210  Pulse 94 11/08/22 1215  Resp 16 11/08/22 1215  SpO2 93 % 11/08/22 1215  Vitals shown include unvalidated device data.  Last Pain:  Vitals:   11/08/22 0613  TempSrc: Oral         Complications: No notable events documented.

## 2022-11-08 NOTE — Anesthesia Procedure Notes (Signed)
Procedure Name: Intubation Date/Time: 11/08/2022 7:32 AM  Performed by: Bishop Limbo, CRNAPre-anesthesia Checklist: Patient identified, Emergency Drugs available, Suction available and Patient being monitored Patient Re-evaluated:Patient Re-evaluated prior to induction Oxygen Delivery Method: Circle System Utilized Preoxygenation: Pre-oxygenation with 100% oxygen Induction Type: IV induction Ventilation: Mask ventilation without difficulty Laryngoscope Size: Mac and 3 Grade View: Grade I Tube type: Oral Tube size: 7.0 mm Number of attempts: 1 Airway Equipment and Method: Stylet and Bite block Placement Confirmation: ETT inserted through vocal cords under direct vision, positive ETCO2 and breath sounds checked- equal and bilateral Secured at: 22 cm Tube secured with: Tape Dental Injury: Teeth and Oropharynx as per pre-operative assessment

## 2022-11-08 NOTE — Discharge Instructions (Signed)

## 2022-11-09 DIAGNOSIS — N812 Incomplete uterovaginal prolapse: Secondary | ICD-10-CM

## 2022-11-09 DIAGNOSIS — N393 Stress incontinence (female) (male): Secondary | ICD-10-CM

## 2022-11-09 MED ORDER — OXYCODONE HCL 5 MG PO TABS
ORAL_TABLET | ORAL | Status: AC
  Filled 2022-11-09: qty 1

## 2022-11-09 MED ORDER — IBUPROFEN 200 MG PO TABS
ORAL_TABLET | ORAL | Status: AC
  Filled 2022-11-09: qty 3

## 2022-11-09 NOTE — Discharge Summary (Signed)
Physician Discharge Summary  Patient ID: Stacey Coleman MRN: [id number] DOB/AGE: July 20, 1951 72 y.o.  Admit date: 11/08/2022 Discharge date: 11/09/2022  Admission Diagnoses:  Discharge Diagnoses:  Principal Problem:   Uterovaginal prolapse, incomplete   Discharged Condition: {condition:18240}  Hospital Course: ***  Consults: {consultation:18241}  Significant Diagnostic Studies: {diagnostics:18242}  Treatments: {Tx:18249}  Discharge Exam: Blood pressure 135/71, pulse 92, temperature 98.1 F (36.7 C), resp. rate 14, height  (1.499 m), weight 65 kg, SpO2 98 %. {physical VOZD:[id number]}  Disposition: Discharge disposition: 01-Home or Self Care       Discharge Instructions     Call MD for:  persistant nausea and vomiting   Complete by: As directed    Call MD for:  persistant nausea and vomiting   Complete by: As directed    Call MD for:  redness, tenderness, or signs of infection (pain, swelling, redness, odor or green/yellow discharge around incision site)   Complete by: As directed    Call MD for:  redness, tenderness, or signs of infection (pain, swelling, redness, odor or green/yellow discharge around incision site)   Complete by: As directed    Call MD for:  severe uncontrolled pain   Complete by: As directed    Call MD for:  severe uncontrolled pain   Complete by: As directed    Call MD for:  temperature >100.4   Complete by: As directed    Call MD for:  temperature >100.4   Complete by: As directed    Diet general   Complete by: As directed    Diet general   Complete by: As directed    Increase activity slowly   Complete by: As directed    Increase activity slowly   Complete by: As directed    May walk up steps   Complete by: As directed    May walk up steps   Complete by: As directed       Allergies as of 11/09/2022   No Known Allergies      Medication List     TAKE these medications    acetaminophen 500 MG tablet Commonly known as:  TYLENOL Take 1 tablet (500 mg total) by mouth every 6 (six) hours as needed (pain). Notes to patient: May take a dose at any time    ADVIL PM PO Take by mouth at bedtime.   amLODipine 5 MG tablet Commonly known as: NORVASC Take 5 mg by mouth daily.   benazepril-hydrochlorthiazide 20-12.5 MG tablet Commonly known as: LOTENSIN HCT Take 1 tablet by mouth daily.   calcium carbonate 1500 (600 Ca) MG Tabs tablet Commonly known as: OSCAL Take by mouth 2 (two) times daily with a meal.   calcium carbonate 500 MG chewable tablet Commonly known as: TUMS - dosed in mg elemental calcium Chew by mouth.   CINNAMON EXTRACT PO Take by mouth.   CULTURELLE DIGESTIVE DAILY PO Take by mouth.   denosumab 60 MG/ML Sosy injection Commonly known as: PROLIA Inject 60 mg into the skin every 6 (six) months.   diclofenac Sodium 1 % Gel Commonly known as: VOLTAREN diclofenac 1 % topical gel   fexofenadine 180 MG tablet Commonly known as: ALLEGRA Take 180 mg by mouth as needed.   ibuprofen 600 MG tablet Commonly known as: ADVIL Take 1 tablet (600 mg total) by mouth every 6 (six) hours as needed. Notes to patient: May take next dose at 12:30 pm   levothyroxine 125 MCG tablet Commonly known as: SYNTHROID Take 125  mcg by mouth daily before breakfast.   methocarbamol 500 MG tablet Commonly known as: ROBAXIN Take 500 mg by mouth every 6 (six) hours as needed.   Omega-3 1000 MG Caps Take by mouth.   omeprazole 20 MG capsule Commonly known as: PRILOSEC Take 20 mg by mouth daily.   oxyCODONE 5 MG immediate release tablet Commonly known as: Oxy IR/ROXICODONE Take 1 tablet (5 mg total) by mouth every 4 (four) hours as needed for severe pain. Notes to patient: May take next dose at 12:30 pm   PEPOGEST PO Take by mouth.   polyethylene glycol powder 17 GM/SCOOP powder Commonly known as: GLYCOLAX/MIRALAX Take 17 g by mouth daily. Drink 17g (1 scoop) dissolved in water per day.   PROBIOTIC  ACIDOPHILUS PO Take by mouth daily.   Red Yeast Rice 600 MG Caps Take by mouth.   Relafen 500 MG tablet Generic drug: nabumetone as needed.   Systane Balance 0.6 % Soln Generic drug: Propylene Glycol Apply to eye.   triamcinolone 55 MCG/ACT Aero nasal inhaler Commonly known as: NASACORT Place 2 sprays into both nostrils at bedtime.   Vitamin D3 50 MCG (2000 UT) capsule Take by mouth daily.   vitamin k 100 MCG tablet Take 100 mcg by mouth daily.         Signed: Marguerita Beards 11/09/2022, 8:55 AM

## 2022-11-10 ENCOUNTER — Encounter (HOSPITAL_BASED_OUTPATIENT_CLINIC_OR_DEPARTMENT_OTHER): Payer: Self-pay | Admitting: Obstetrics and Gynecology

## 2022-11-11 LAB — SURGICAL PATHOLOGY

## 2022-11-18 ENCOUNTER — Other Ambulatory Visit (HOSPITAL_BASED_OUTPATIENT_CLINIC_OR_DEPARTMENT_OTHER): Payer: Self-pay | Admitting: Family Medicine

## 2022-11-18 DIAGNOSIS — E7800 Pure hypercholesterolemia, unspecified: Secondary | ICD-10-CM

## 2022-11-26 ENCOUNTER — Encounter: Payer: Medicare PPO | Admitting: Obstetrics and Gynecology

## 2022-11-26 ENCOUNTER — Other Ambulatory Visit (HOSPITAL_COMMUNITY)
Admission: RE | Admit: 2022-11-26 | Discharge: 2022-11-26 | Disposition: A | Discharge status: Home / Self Care | Payer: Medicare PPO | Attending: Obstetrics and Gynecology | Admitting: Obstetrics and Gynecology

## 2022-11-26 ENCOUNTER — Ambulatory Visit (INDEPENDENT_AMBULATORY_CARE_PROVIDER_SITE_OTHER): Payer: Medicare PPO | Admitting: Obstetrics and Gynecology

## 2022-11-26 ENCOUNTER — Encounter: Payer: Self-pay | Admitting: Obstetrics and Gynecology

## 2022-11-26 VITALS — BP 133/82 | HR 93

## 2022-11-26 DIAGNOSIS — R350 Frequency of micturition: Secondary | ICD-10-CM | POA: Diagnosis not present

## 2022-11-26 DIAGNOSIS — Z9889 Other specified postprocedural states: Secondary | ICD-10-CM

## 2022-11-26 DIAGNOSIS — R82998 Other abnormal findings in urine: Secondary | ICD-10-CM

## 2022-11-26 LAB — POCT URINALYSIS DIPSTICK
Bilirubin, UA: NEGATIVE
Glucose, UA: NEGATIVE
Ketones, UA: NEGATIVE
Nitrite, UA: NEGATIVE
Protein, UA: NEGATIVE
Spec Grav, UA: 1.015 (ref 1.010–1.025)
Urobilinogen, UA: 0.2 E.U./dL
pH, UA: 7 (ref 5.0–8.0)

## 2022-11-26 NOTE — Addendum Note (Signed)
Addended by: Salina April on: 11/26/2022 11:10 AM   Modules accepted: Orders

## 2022-11-26 NOTE — Progress Notes (Signed)
[facility]  Date of Visit: 11/26/2022  History of Present Illness: Stacey Coleman is a 72 y.o. female scheduled today for a post-operative visit.   Surgery: s/p Total vaginal hysterectomy with bilateral salpingo-oophorectomy, uterosacral ligament suspension, anterior repair, midurethral sling (advantage fit), cystoscopy with retrograde pyelogram on 11/08/22  She passed her postoperative void trial.   Postoperative course has been uncomplicated.   Today she reports has a little lower abdominal discomfort, takes an occasional motrin. Has a little yellow/ brown discharge. Has a temp of 99.2.   UTI symptoms? Has occasional burning/ discomfort  with urination and pressure Pain?  mild She has returned to her normal activity (except for postop restrictions) Vaginal bulge? No  Stress incontinence: No - had some in the first few weeks with movement.  Urgency/frequency: No  Urge incontinence: No  Voiding dysfunction: No  Bowel issues:  some discomfort with bowel movements but not trying to strain, not taking miralax currently.   Pathology results: UTERUS, CERVIX, BILATERAL FALLOPIAN TUBES AND OVARIES:  - Cervix:     - Benign squamous mucosa; negative for dysplasia     - Endocervix with nabothian cysts and slight inflammation  - Endometrium:     - Scant, benign atrophic endometrium; negative for hyperplasia  - Myometrium:     - Leiomyoma with hyalinization and degenerative changes  - Adnexa:     - Bilateral, benign ovaries     - Bilateral, benign fimbriated fallopian tubes  - Vascular changes and intimal hyperplasia suggestive of atherosclerosis  - Negative for malignancy   Medications: She has a current medication list which includes the following prescription(s): acetaminophen, amlodipine, benazepril-hydrochlorthiazide, calcium carbonate, calcium carbonate, vitamin d3, cinnamon, denosumab, diclofenac sodium, fexofenadine, ibuprofen, ibuprofen-diphenhydramine cit, lactobacillus,  lactobacillus-inulin, levothyroxine, methocarbamol, nabumetone, omega-3, omeprazole, peppermint oil, polyethylene glycol powder, systane balance, red yeast rice, triamcinolone, and vitamin k.   Allergies: Patient has No Known Allergies.   Physical Exam: BP 133/82   Pulse 93   Abdomen: soft, non-tender, without masses or organomegaly Suprapubic Incisions: healing well, glue still present Pelvic Examination: Vagina: Incisions healing well. Sutures are present at incision line and there is not granulation tissue. No tenderness along the anterior or posterior vagina. No apical tenderness. No pelvic masses. No visible or palpable mesh.  POP-Q: deferred  POC urine: small leukocytes ---------------------------------------------------------  Assessment and Plan:  1. Post-operative state     - Healing well overall. Will plan for POP-Q at next visit.  - Will send urine for culture today.  - Pathology results were reviewed with the patient today and she verbalized understanding that the results were benign.  - Continue lifting restrictions and pelvic rest. She is able to restart PT exercises for her back.  - Discussed avoidance of long term heavy lifting and straining long term to reduce the risk of recurrence.  - Return 1 month  Marguerita Beards, MD

## 2022-11-27 LAB — URINE CULTURE: Culture: NO GROWTH

## 2022-12-01 DIAGNOSIS — M180 Bilateral primary osteoarthritis of first carpometacarpal joints: Secondary | ICD-10-CM | POA: Diagnosis not present

## 2022-12-01 DIAGNOSIS — M65332 Trigger finger, left middle finger: Secondary | ICD-10-CM | POA: Diagnosis not present

## 2022-12-09 DIAGNOSIS — M180 Bilateral primary osteoarthritis of first carpometacarpal joints: Secondary | ICD-10-CM | POA: Diagnosis not present

## 2022-12-09 DIAGNOSIS — M25541 Pain in joints of right hand: Secondary | ICD-10-CM | POA: Diagnosis not present

## 2022-12-23 NOTE — Progress Notes (Signed)
[facility]  Date of Visit: 12/24/2022  History of Present Illness: [name] is a 72 y.o. female scheduled today for a post-operative visit.   Surgery: s/p Total vaginal hysterectomy with bilateral salpingo-oophorectomy, uterosacral ligament suspension, anterior repair, midurethral sling (advantage fit), cystoscopy with retrograde pyelogram on 11/08/22  She passed her postoperative void trial.   Postoperative course has been uncomplicated.   Today she reports has been having some bloating and has seen pinkish vaginal discharge.   UTI symptoms? Has occasional burning/ discomfort  with urination and pressure Pain? No  She has returned to her normal activity (except for postop restrictions) Vaginal bulge? No  Stress incontinence: No - had some in the first few weeks with movement.  Urgency/frequency: No  Urge incontinence: No  Voiding dysfunction: No  Bowel issues: No is taking Miralax now   Pathology results: UTERUS, CERVIX, BILATERAL FALLOPIAN TUBES AND OVARIES:  - Cervix:     - Benign squamous mucosa; negative for dysplasia     - Endocervix with nabothian cysts and slight inflammation  - Endometrium:     - Scant, benign atrophic endometrium; negative for hyperplasia  - Myometrium:     - Leiomyoma with hyalinization and degenerative changes  - Adnexa:     - Bilateral, benign ovaries     - Bilateral, benign fimbriated fallopian tubes  - Vascular changes and intimal hyperplasia suggestive of atherosclerosis  - Negative for malignancy   Medications: She has a current medication list which includes the following prescription(s): acetaminophen, amlodipine, benazepril-hydrochlorthiazide, calcium carbonate, calcium carbonate, vitamin d3, cinnamon, denosumab, diclofenac sodium, [START ON 12/27/2022] estradiol, fexofenadine, ibuprofen, ibuprofen-diphenhydramine cit, lactobacillus, lactobacillus-inulin, levothyroxine, methocarbamol, nabumetone, omega-3, omeprazole, peppermint  oil, polyethylene glycol powder, systane balance, red yeast rice, triamcinolone, and vitamin k.   Allergies: Patient has No Known Allergies.   Physical Exam: BP 134/79   Pulse 86   Abdomen: soft, non-tender, without masses or organomegaly Suprapubic Incisions: healing well, glue still present Pelvic Examination: Vagina: Incisions healing well. Sutures are present at incision line and there is granulation tissue. No tenderness along the anterior or posterior vagina. No apical tenderness. No pelvic masses. No visible or palpable mesh.  POP-Q:  POP-Q  -2.5                                            Aa   -2.5                                           Ba  -7                                              C   3                                            Gh  3.5  Pb  8                                            tvl   -3                                            Ap  -3                                            Bp                                                 D     ---------------------------------------------------------  Assessment and Plan:  1. Postop check   2. Vaginal atrophy      - Healing well overall but there is granulation tissue with some noted irritation at the cuff. Will have patient start estrogen cream nightly for 2 weeks, then twice weekly.  - Continue lifting restrictions and pelvic rest. .  - Discussed avoidance of long term heavy lifting and straining long term to reduce the risk of recurrence.  - Return 3 weeks for Re-check.   Stacey Dominion, NP

## 2022-12-24 ENCOUNTER — Encounter: Payer: Self-pay | Admitting: Obstetrics and Gynecology

## 2022-12-24 ENCOUNTER — Ambulatory Visit (INDEPENDENT_AMBULATORY_CARE_PROVIDER_SITE_OTHER): Payer: Medicare PPO | Admitting: Obstetrics and Gynecology

## 2022-12-24 VITALS — BP 134/79 | HR 86

## 2022-12-24 DIAGNOSIS — Z09 Encounter for follow-up examination after completed treatment for conditions other than malignant neoplasm: Secondary | ICD-10-CM

## 2022-12-24 DIAGNOSIS — N952 Postmenopausal atrophic vaginitis: Secondary | ICD-10-CM

## 2022-12-24 MED ORDER — ESTRADIOL 0.1 MG/GM VA CREA
0.5000 g | TOPICAL_CREAM | VAGINAL | 11 refills | Status: AC
Start: 2022-12-27 — End: ?

## 2022-12-24 NOTE — Patient Instructions (Addendum)
Use the estrogen cream nightly for 2 weeks and then twice weekly after that.   Use a blueberry sized amount onto the finger into the vagina and massage it around.

## 2022-12-27 ENCOUNTER — Ambulatory Visit (HOSPITAL_BASED_OUTPATIENT_CLINIC_OR_DEPARTMENT_OTHER)
Admission: RE | Admit: 2022-12-27 | Discharge: 2022-12-27 | Disposition: A | Discharge status: Home / Self Care | Payer: Medicare PPO | Source: Ambulatory Visit | Attending: Family Medicine | Admitting: Family Medicine

## 2022-12-27 DIAGNOSIS — E7800 Pure hypercholesterolemia, unspecified: Secondary | ICD-10-CM | POA: Insufficient documentation

## 2023-01-20 NOTE — Progress Notes (Signed)
[facility] Return Visit  SUBJECTIVE  History of Present Illness: Stacey Coleman is a 72 y.o. female seen in follow-up for post-op.   Surgery: s/p Total vaginal hysterectomy with bilateral salpingo-oophorectomy, uterosacral ligament suspension, anterior repair, midurethral sling (advantage fit), cystoscopy with retrograde pyelogram on 11/08/22  She passed her postoperative void trial.   Postoperative course has been uncomplicated.   At last visit she had some granulation tissue noted at the vaginal cuff and was started on estrogen cream.   She reports she is still having some tugging sensations on the left side of the vagina/abdominal region.   Past Medical History: Patient  has a past medical history of Anemia (2013), Arthritis, Bursitis, Cancer (HCC) (2004), COVID-19 (02/2022), Family history of adverse reaction to anesthesia, GERD (gastroesophageal reflux disease), History of hiatal hernia, Hypertension, Hypothyroid, Laryngotracheal cleft, Nasal congestion, Osteoporosis, Pre-diabetes, Seasonal allergies, Sleep apnea, Wears hearing aid, and Wears partial dentures.   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   Medications: She has a current medication list which includes the following prescription(s): acetaminophen, amlodipine, benazepril-hydrochlorthiazide, calcium carbonate,  calcium carbonate, vitamin d3, cinnamon, denosumab, diclofenac sodium, estradiol, fexofenadine, ibuprofen, ibuprofen-diphenhydramine cit, lactobacillus, lactobacillus-inulin, levothyroxine, methocarbamol, nabumetone, omega-3, omeprazole, peppermint oil, polyethylene glycol powder, systane balance, rosuvastatin, triamcinolone, vitamin k, and red yeast rice.   Allergies: Patient has No Known Allergies.   Social History: Patient  reports that she has never smoked. She has never used smokeless tobacco. She reports current alcohol use. She reports that she does not use drugs.      OBJECTIVE     Physical Exam: Vitals:   01/21/23 1112  BP: 126/70  Pulse: 69   Gen: No apparent distress, A&O x 3.  Detailed Urogynecologic Evaluation:  Patient's tissues are mostly well healed. She still has a small area of granulation tissue on the left side at the apex of the vagina. The tissue has improved but it is still oozing small amount of blood and has surrounding irritation. Used a small amount of silver nitrate to the area to stop the oozing and premarin applied to the area.     ASSESSMENT AND PLAN    Stacey Coleman is a 72 y.o. with:  1. Post-operative state    Patient has overall healed well. She is cleared to swim. Encouraged her to continue to use the estrogen cream x2 weekly. Would like her to follow up one more time in 6 weeks with surgeon.   Patient to follow up in 6 weeks.

## 2023-01-21 ENCOUNTER — Encounter: Payer: Self-pay | Admitting: Obstetrics and Gynecology

## 2023-01-21 ENCOUNTER — Ambulatory Visit (INDEPENDENT_AMBULATORY_CARE_PROVIDER_SITE_OTHER): Payer: Medicare PPO | Admitting: Obstetrics and Gynecology

## 2023-01-21 VITALS — BP 126/70 | HR 69

## 2023-01-21 DIAGNOSIS — Z9889 Other specified postprocedural states: Secondary | ICD-10-CM

## 2023-01-21 NOTE — Patient Instructions (Addendum)
You are cleared to swim.   I would like you to see Dr. Stephanie Acre just once when she comes back as you have had some irritation.   Continue your estrogen cream x2 weekly.

## 2023-02-07 DIAGNOSIS — K648 Other hemorrhoids: Secondary | ICD-10-CM | POA: Diagnosis not present

## 2023-02-07 DIAGNOSIS — Z86010 Personal history of colonic polyps: Secondary | ICD-10-CM | POA: Diagnosis not present

## 2023-02-07 DIAGNOSIS — Z1211 Encounter for screening for malignant neoplasm of colon: Secondary | ICD-10-CM | POA: Diagnosis not present

## 2023-02-07 DIAGNOSIS — K5730 Diverticulosis of large intestine without perforation or abscess without bleeding: Secondary | ICD-10-CM | POA: Diagnosis not present

## 2023-02-14 DIAGNOSIS — M810 Age-related osteoporosis without current pathological fracture: Secondary | ICD-10-CM | POA: Diagnosis not present

## 2023-03-09 ENCOUNTER — Encounter: Payer: Self-pay | Admitting: Obstetrics and Gynecology

## 2023-03-09 ENCOUNTER — Ambulatory Visit (INDEPENDENT_AMBULATORY_CARE_PROVIDER_SITE_OTHER): Payer: Medicare PPO | Admitting: Obstetrics and Gynecology

## 2023-03-09 VITALS — BP 147/91 | HR 89

## 2023-03-09 DIAGNOSIS — L929 Granulomatous disorder of the skin and subcutaneous tissue, unspecified: Secondary | ICD-10-CM

## 2023-03-09 NOTE — Progress Notes (Signed)
[facility] Return Visit  SUBJECTIVE  History of Present Illness: Stacey Coleman is a 72 y.o. female seen in follow-up for post-op.   Surgery: s/p Total vaginal hysterectomy with bilateral salpingo-oophorectomy, uterosacral ligament suspension, anterior repair, midurethral sling (advantage fit), cystoscopy with retrograde pyelogram on 11/08/22  At last visit she had some granulation tissue noted at the vaginal cuff and was started on estrogen cream. Has not noticed any vaginal bleeding.   She reports she is still having some tugging sensations on the left side of the vagina/abdominal region. Has returned to exercise.   Has noticed some increased urinary urgency in the last few weeks. This is random and she has been able to control it. Sometimes has a little slow bowel movements, but has been trying to avoid straining.   Past Medical History: Patient  has a past medical history of Anemia (2013), Arthritis, Bursitis, Cancer (HCC) (2004), COVID-19 (02/2022), Family history of adverse reaction to anesthesia, GERD (gastroesophageal reflux disease), History of hiatal hernia, Hypertension, Hypothyroid, Laryngotracheal cleft, Nasal congestion, Osteoporosis, Pre-diabetes, Seasonal allergies, Sleep apnea, Wears hearing aid, and Wears partial dentures.   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    Medications: She has a current medication list which includes the following prescription(s): acetaminophen, amlodipine, benazepril-hydrochlorthiazide, calcium carbonate, calcium carbonate, vitamin d3, cinnamon, denosumab, diclofenac sodium, estradiol, fexofenadine, ibuprofen, ibuprofen-diphenhydramine cit, lactobacillus, lactobacillus-inulin, levothyroxine, methocarbamol, nabumetone, omega-3, omeprazole, peppermint oil, polyethylene glycol powder, systane balance, red yeast rice, rosuvastatin, triamcinolone, and vitamin k.   Allergies: Patient has No Known Allergies.   Social History: Patient  reports that she has never smoked. She has never used smokeless tobacco. She reports current alcohol use. She reports that she does not use drugs.      OBJECTIVE     Physical Exam: Vitals:   03/09/23 1351  BP: (!) 147/91  Pulse: 89    Gen: No apparent distress, A&O x 3.  Detailed Urogynecologic Evaluation:  Normal external genitalia. On speculum, normal vaginal mucosa. On bimanual, no masses present.  Muscle spasm noted on left side pelvic floor. Reproduced pain sensation with palpation.     ASSESSMENT AND PLAN    Ms. [name] is a 72 y.o. with:  1. Granulation tissue     - Well healed. No evidence of further granulation tissue. - Discussed massage of pelvic floor with finger or want to help reduce muscle spasm. If no improvement after a few weeks, recommend pelvic PT for further assessment and treatment. - Avoid heavy lifting or straining long term to help prevent recurrence.  - Avoid bladder irritants. Discussed option of medication if urgency symptoms become more frequent.  - Can take miralax for any signs of constipation to help reduce straining.   Follow up as needed  Marguerita Beards, MD  Time spent: I spent 25 minutes dedicated to the care of this patient on the date of this encounter to include pre-visit review of records, face-to-face time with the patient and post  visit documentation.

## 2023-04-01 DIAGNOSIS — M65331 Trigger finger, right middle finger: Secondary | ICD-10-CM | POA: Diagnosis not present

## 2023-04-01 DIAGNOSIS — M65332 Trigger finger, left middle finger: Secondary | ICD-10-CM | POA: Diagnosis not present

## 2023-05-02 DIAGNOSIS — E039 Hypothyroidism, unspecified: Secondary | ICD-10-CM | POA: Diagnosis not present

## 2023-05-02 DIAGNOSIS — M85851 Other specified disorders of bone density and structure, right thigh: Secondary | ICD-10-CM | POA: Diagnosis not present

## 2023-05-02 DIAGNOSIS — I10 Essential (primary) hypertension: Secondary | ICD-10-CM | POA: Diagnosis not present

## 2023-05-02 DIAGNOSIS — E7800 Pure hypercholesterolemia, unspecified: Secondary | ICD-10-CM | POA: Diagnosis not present

## 2023-05-02 DIAGNOSIS — M545 Low back pain, unspecified: Secondary | ICD-10-CM | POA: Diagnosis not present

## 2023-05-02 DIAGNOSIS — R7303 Prediabetes: Secondary | ICD-10-CM | POA: Diagnosis not present

## 2023-05-02 DIAGNOSIS — G4700 Insomnia, unspecified: Secondary | ICD-10-CM | POA: Diagnosis not present

## 2023-05-02 DIAGNOSIS — R1319 Other dysphagia: Secondary | ICD-10-CM | POA: Diagnosis not present

## 2023-05-02 DIAGNOSIS — J387 Other diseases of larynx: Secondary | ICD-10-CM | POA: Diagnosis not present

## 2023-05-11 DIAGNOSIS — M25551 Pain in right hip: Secondary | ICD-10-CM | POA: Diagnosis not present

## 2023-05-11 DIAGNOSIS — M25552 Pain in left hip: Secondary | ICD-10-CM | POA: Diagnosis not present

## 2023-05-11 DIAGNOSIS — M48061 Spinal stenosis, lumbar region without neurogenic claudication: Secondary | ICD-10-CM | POA: Diagnosis not present

## 2023-05-17 DIAGNOSIS — M545 Low back pain, unspecified: Secondary | ICD-10-CM | POA: Diagnosis not present

## 2023-05-28 ENCOUNTER — Encounter (HOSPITAL_COMMUNITY): Payer: Self-pay

## 2023-05-28 ENCOUNTER — Emergency Department (HOSPITAL_COMMUNITY): Payer: Medicare PPO

## 2023-05-28 ENCOUNTER — Emergency Department (HOSPITAL_COMMUNITY)
Admission: EM | Admit: 2023-05-28 | Discharge: 2023-05-28 | Disposition: A | Discharge status: Home / Self Care | Payer: Medicare PPO | Attending: Emergency Medicine | Admitting: Emergency Medicine

## 2023-05-28 ENCOUNTER — Other Ambulatory Visit: Payer: Self-pay

## 2023-05-28 DIAGNOSIS — M25531 Pain in right wrist: Secondary | ICD-10-CM | POA: Diagnosis not present

## 2023-05-28 DIAGNOSIS — S4990XA Unspecified injury of shoulder and upper arm, unspecified arm, initial encounter: Secondary | ICD-10-CM | POA: Diagnosis present

## 2023-05-28 DIAGNOSIS — M1811 Unilateral primary osteoarthritis of first carpometacarpal joint, right hand: Secondary | ICD-10-CM | POA: Diagnosis not present

## 2023-05-28 DIAGNOSIS — W010XXA Fall on same level from slipping, tripping and stumbling without subsequent striking against object, initial encounter: Secondary | ICD-10-CM | POA: Insufficient documentation

## 2023-05-28 DIAGNOSIS — Y92009 Unspecified place in unspecified non-institutional (private) residence as the place of occurrence of the external cause: Secondary | ICD-10-CM | POA: Diagnosis not present

## 2023-05-28 DIAGNOSIS — S4291XA Fracture of right shoulder girdle, part unspecified, initial encounter for closed fracture: Secondary | ICD-10-CM | POA: Diagnosis not present

## 2023-05-28 DIAGNOSIS — W19XXXA Unspecified fall, initial encounter: Secondary | ICD-10-CM | POA: Diagnosis not present

## 2023-05-28 DIAGNOSIS — S42291A Other displaced fracture of upper end of right humerus, initial encounter for closed fracture: Secondary | ICD-10-CM | POA: Diagnosis not present

## 2023-05-28 DIAGNOSIS — S42201A Unspecified fracture of upper end of right humerus, initial encounter for closed fracture: Secondary | ICD-10-CM | POA: Diagnosis not present

## 2023-05-28 DIAGNOSIS — M8580 Other specified disorders of bone density and structure, unspecified site: Secondary | ICD-10-CM | POA: Diagnosis not present

## 2023-05-28 DIAGNOSIS — M25511 Pain in right shoulder: Secondary | ICD-10-CM | POA: Diagnosis not present

## 2023-05-28 MED ORDER — OXYCODONE-ACETAMINOPHEN 5-325 MG PO TABS
1.0000 | ORAL_TABLET | ORAL | Status: DC | PRN
  Administered 2023-05-28: 1 via ORAL
  Filled 2023-05-28: qty 1

## 2023-05-28 MED ORDER — OXYCODONE-ACETAMINOPHEN 5-325 MG PO TABS: 1.0000 | ORAL_TABLET | Freq: Four times a day (QID) | ORAL | 0 refills | Status: DC | PRN

## 2023-05-28 NOTE — ED Provider Notes (Addendum)
[place] EMERGENCY DEPARTMENT AT [facility] Provider Note   CSN: [id number] Arrival date & time: 05/28/23  1239     History Chief Complaint  Patient presents with   Arm Injury    Stacey Coleman is a 72 y.o. female.  Patient presents to the emergency department concerns of arm injury.  Reports that she has been having right arm pain after a mechanical fall earlier today when she tripped over an object on the road.  Was seen in urgent care earlier today with x-ray performed at that time which showed a comminuted, moderately displaced fracture of the humeral head and neck. Patient placed into a sling and advised to seek orthopedic evaluation. Also endorsing pain to the right wrist and hand from the fall. No left sided pain, no chest wall pain.   Arm Injury      Home Medications Prior to Admission medications   Medication Sig Start Date End Date Taking? Authorizing Provider  oxyCODONE-acetaminophen (PERCOCET/ROXICET) 5-325 MG tablet Take 1 tablet by mouth every 6 (six) hours as needed for severe pain (pain score 7-10). 05/28/23  Yes Smitty Knudsen, PA-C  acetaminophen (TYLENOL) 500 MG tablet Take 1 tablet (500 mg total) by mouth every 6 (six) hours as needed (pain). 10/25/22   Selmer Dominion, NP  amLODipine (NORVASC) 5 MG tablet Take 5 mg by mouth daily.    [provider]  benazepril-hydrochlorthiazide (LOTENSIN HCT) 20-12.5 MG per tablet Take 1 tablet by mouth daily.    [provider]  calcium carbonate (OSCAL) 1500 (600 Ca) MG TABS tablet Take by mouth 2 (two) times daily with a meal.    [provider]  calcium carbonate (TUMS - DOSED IN MG ELEMENTAL CALCIUM) 500 MG chewable tablet Chew by mouth.    [provider]  Cholecalciferol (VITAMIN D3) 2000 units capsule Take by mouth daily.    [provider]  CINNAMON EXTRACT PO Take by mouth.    [provider]  denosumab (PROLIA) 60 MG/ML SOSY injection Inject 60 mg  into the skin every 6 (six) months.    [provider]  diclofenac Sodium (VOLTAREN) 1 % GEL diclofenac 1 % topical gel    [provider]  estradiol (ESTRACE) 0.1 MG/GM vaginal cream Place 0.5 g vaginally 2 (two) times a week. Place 0.5g nightly for two weeks then twice a week after 12/27/22   Selmer Dominion, NP  fexofenadine (ALLEGRA) 180 MG tablet Take 180 mg by mouth as needed.    [provider]  ibuprofen (ADVIL) 600 MG tablet Take 1 tablet (600 mg total) by mouth every 6 (six) hours as needed. 10/25/22   Selmer Dominion, NP  Ibuprofen-diphenhydrAMINE Cit (ADVIL PM PO) Take by mouth at bedtime.    [provider]  Lactobacillus (PROBIOTIC ACIDOPHILUS PO) Take by mouth daily.    [provider]  Lactobacillus-Inulin (CULTURELLE DIGESTIVE DAILY PO) Take by mouth.    [provider]  levothyroxine (SYNTHROID, LEVOTHROID) 125 MCG tablet Take 125 mcg by mouth daily before breakfast.    [provider]  methocarbamol (ROBAXIN) 500 MG tablet Take 500 mg by mouth every 6 (six) hours as needed.    [provider]  nabumetone (RELAFEN) 500 MG tablet as needed. 06/11/20   [provider]  Omega-3 1000 MG CAPS Take by mouth.    [provider]  omeprazole (PRILOSEC) 20 MG capsule Take 20 mg by mouth daily. 08/11/22   [provider]  Peppermint Oil (PEPOGEST PO) Take by mouth.    [provider]  polyethylene glycol powder (GLYCOLAX/MIRALAX) 17 GM/SCOOP powder Take 17 g by mouth daily. Drink 17g (1 scoop) dissolved in water per day. 10/25/22   Selmer Dominion, NP  Propylene Glycol (SYSTANE BALANCE) 0.6 % SOLN Apply to eye.    [provider]  Red Yeast Rice 600 MG CAPS Take by mouth. Patient not taking: Reported on 01/21/2023    [provider]  rosuvastatin (CRESTOR) 5 MG tablet 1 tablet Orally Once a day for 30 days 12/31/22   [provider]  triamcinolone (NASACORT) 55  MCG/ACT AERO nasal inhaler Place 2 sprays into both nostrils at bedtime.    [provider]  vitamin k 100 MCG tablet Take 100 mcg by mouth daily.    [provider]      Allergies    Macrolides and ketolides    Review of Systems   Review of Systems  Musculoskeletal:        Right arm pain  All other systems reviewed and are negative.   Physical Exam Updated Vital Signs BP (!) 158/78   Pulse 74   Temp 97.9 F (36.6 C)   Resp 18   Ht 4\' 11"  (1.499 m)   Wt 64.9 kg   SpO2 98%   BMI 28.88 kg/m  Physical Exam Vitals and nursing note reviewed.  Constitutional:      General: She is not in acute distress.    Appearance: She is well-developed.  HENT:     Head: Normocephalic and atraumatic.  Eyes:     Conjunctiva/sclera: Conjunctivae normal.  Cardiovascular:     Rate and Rhythm: Normal rate and regular rhythm.     Heart sounds: No murmur heard. Pulmonary:     Effort: Pulmonary effort is normal. No respiratory distress.     Breath sounds: Normal breath sounds.  Abdominal:     Palpations: Abdomen is soft.     Tenderness: There is no abdominal tenderness.  Musculoskeletal:        General: Tenderness and signs of injury present. No swelling.     Cervical back: Neck supple.     Comments: Difficult to assess right arm as patient is currently wearing a sling. Neurovascularly intact.  No focal tenderness or abnormality to the right hand or wrist.  Some mild bruising noted on the dorsal aspect of the hand.  Skin:    General: Skin is warm and dry.     Capillary Refill: Capillary refill takes less than 2 seconds.  Neurological:     Mental Status: She is alert.  Psychiatric:        Mood and Affect: Mood normal.     ED Results / Procedures / Treatments   Labs (all labs ordered are listed, but only abnormal results are displayed) Labs Reviewed - No data to display  EKG None  Radiology CT SHOULDER RIGHT WO CONTRAST  Result Date: 05/28/2023 CLINICAL DATA:   Shoulder trauma, fracture of humerus or scapula possible acute nondisplaced EXAM: CT OF THE UPPER RIGHT EXTREMITY WITHOUT CONTRAST TECHNIQUE: Multidetector CT imaging of the upper right extremity was performed according to the standard protocol. RADIATION DOSE REDUCTION: This exam was performed according to the departmental dose-optimization program which includes automated exposure control, adjustment of the mA and/or kV according to patient size and/or use of iterative reconstruction technique. COMPARISON:  X-ray right humerus 05/28/2023 FINDINGS: Bones/Joint/Cartilage Question acute nondisplaced fracture of the inferior glenoid (  6:52, 602:68). Acute displaced and comminuted humeral head and anatomical neck fracture. No scapular spine or body fracture. No chrome in fracture. No coracoid fracture. No clavicular fracture. No acute displaced fractures of the bone visualized ribs. Ligaments Suboptimally assessed by CT. Muscles and Tendons Heterogeneity of the surrounding musculature likely representing intramuscular hematoma formation. Soft tissues Mild lateral arm severe soft tissue edema and likely underlying hematoma. Other: None IMPRESSION: 1. Acute displaced and comminuted humeral head and anatomical neck fracture. 2. Question acute nondisplaced fracture of the inferior glenoid. Electronically Signed   By: Tish Frederickson M.D.   On: 05/28/2023 16:12   DG Wrist Complete Right  Result Date: 05/28/2023 CLINICAL DATA:  FOOSH, wrist pain EXAM: RIGHT WRIST - COMPLETE 3+ VIEW; RIGHT HAND - COMPLETE 3+ VIEW COMPARISON:  None Available. FINDINGS: Right hand: There is no evidence of fracture or dislocation. Cortical irregularity of the fifth metacarpal body consistent with old healed fracture. Interphalangeal joint degenerative changes. Soft tissues are unremarkable. Right wrist: No evidence of fracture, dislocation, or joint effusion. Severe degenerative changes of the first carpometacarpal joint. Soft tissues are  unremarkable. IMPRESSION: No acute displaced fracture or dislocation of the bones of the right hand and wrist. Electronically Signed   By: Tish Frederickson M.D.   On: 05/28/2023 14:54   DG Hand Complete Right  Result Date: 05/28/2023 CLINICAL DATA:  FOOSH, wrist pain EXAM: RIGHT WRIST - COMPLETE 3+ VIEW; RIGHT HAND - COMPLETE 3+ VIEW COMPARISON:  None Available. FINDINGS: Right hand: There is no evidence of fracture or dislocation. Cortical irregularity of the fifth metacarpal body consistent with old healed fracture. Interphalangeal joint degenerative changes. Soft tissues are unremarkable. Right wrist: No evidence of fracture, dislocation, or joint effusion. Severe degenerative changes of the first carpometacarpal joint. Soft tissues are unremarkable. IMPRESSION: No acute displaced fracture or dislocation of the bones of the right hand and wrist. Electronically Signed   By: Tish Frederickson M.D.   On: 05/28/2023 14:54   DG Humerus Right  Result Date: 05/28/2023 CLINICAL DATA:  Pain EXAM: RIGHT HUMERUS - 2+ VIEW COMPARISON:  None Available. FINDINGS: Acute displaced right humeral head and neck fracture. Distally no acute fracture noted of the humerus. No shoulder dislocation. Limited evaluation of the elbow. Soft tissues are unremarkable. IMPRESSION: Acute displaced right humeral head and neck fracture. Electronically Signed   By: Tish Frederickson M.D.   On: 05/28/2023 14:52    Procedures Procedures   Medications Ordered in ED Medications  oxyCODONE-acetaminophen (PERCOCET/ROXICET) 5-325 MG per tablet 1 tablet (1 tablet Oral Given 05/28/23 1321)    ED Course/ Medical Decision Making/ A&P                               Medical Decision Making Amount and/or Complexity of Data Reviewed Radiology: ordered.  Risk Prescription drug management.   This patient presents to the ED for concern of Arm pain. Differential diagnosis includes humeral    Imaging Studies ordered:  I ordered imaging  studies including x-ray of right humerus, right hand, right wrist, CT right shoulder I independently visualized and interpreted imaging which showed x-rays of the hand and wrist are unremarkable, right humerus with displaced humeral head fracture as well as neck fracture.  CT right shoulder imaging confirms these fractures.  Concern for possible inferior glenoid fracture as well. I agree with the radiologist interpretation   Medicines ordered and prescription drug management:  I ordered medication including  Percocet for pain Reevaluation of the patient after these medicines showed that the patient improved I have reviewed the patients home medicines and have made adjustments as needed   Problem List / ED Course:  Patient presented to the emergency department concerns of an arm injury.  She reports that she was tripped over show was around the road.  States that she landed on her outstretched arms with pain on the right arm.  Some wrist pain but primarily concerned about the right shoulder pain.  Was seen in urgent care earlier with x-ray imaging which did show a an acute comminuted fracture of the right humerus.  Advised to come to emergency department for further evaluation and imaging as needed.  Given that I could not see the imaging from the urgent care x-ray, will repeat x-ray imaging of the right humerus.  Will likely consult with orthopedic surgery that as I can see the imaging report which does state that she does have a comminuted fracture of the right humeral head.  Will also try to manage pain as this began with Percocet.  No other acute concerns at this time. Spoke with Dr. Yevette Edwards, orthopedic surgery, who advised this should be managed outpatient. However, CT imaging or right shoulder would be helpful for possible surgical planning but likely will be managed non-operatively. Place into sling and have patient follow up with Dr. Thornell Mule. Provided patient with contact information for  Dr. Hulda Humphrey with [facility]. She states that she may seek care from [facility] but advised patient that she just needs to call [facility] and they will be able to get her in for an appointment quickly. Advised strict return precautions with patient and she verbalized understanding current treatment plan.  Percocet sent to patient's pharmacy for continued pain control at home. No other acute concerns at this time. Patient initially presented in arm sling so need to replace this. Patient discharged home in stable condition.  Final Clinical Impression(s) / ED Diagnoses Final diagnoses:  Other closed displaced fracture of proximal end of right humerus, initial encounter    Rx / DC Orders ED Discharge Orders          Ordered    oxyCODONE-acetaminophen (PERCOCET/ROXICET) 5-325 MG tablet  Every 6 hours PRN        05/28/23 1619              Smitty Knudsen, PA-C 05/28/23 1804    Smitty Knudsen, PA-C 05/28/23 1805    Anders Simmonds T, DO 05/28/23 1952

## 2023-05-28 NOTE — ED Notes (Signed)
Pt returned from xray

## 2023-05-28 NOTE — ED Notes (Signed)
Pt sitting up in the bedside chair she does not want to lie down  family at  the bedside

## 2023-05-28 NOTE — ED Triage Notes (Signed)
Patient was walking across the stretcher going to a painting class and there was brush in the street and she was trying to avoid it and tripped and fell.  Went to UC and has a humerus fx to the right arm.  Arm is in sling.  Patient has abrasion to right palm and bruising to wrist.  Removed rings and ice pack in place

## 2023-05-28 NOTE — Discharge Instructions (Addendum)
You were seen in the emergency department today with concerns of a right arm injury.  X-ray imaging does confirm that you have a fracture to the right humeral head and neck.  CT imaging was also performed for further evaluation of this injury.  I spoke directly with Dr. Yevette Edwards who is an orthopedic surgeon with [facility] who advised that you do require evaluation for this.  Management is likely to be nonoperative however CT imaging will be helpful to make that determination.  You may reach out to another orthopedic surgeon if you have any preferences or hesitations. I have sent a prescription for Percocet for pain control to your pharmacy. Please take this as prescribed for severe pain. For mild pain, take Tylenol or ibuprofen. Keep your arm in the slight as often as possible. If you do feel the symptoms are worsening, return to the emergency department.

## 2023-06-02 DIAGNOSIS — M25511 Pain in right shoulder: Secondary | ICD-10-CM | POA: Diagnosis not present

## 2023-06-02 DIAGNOSIS — S42291A Other displaced fracture of upper end of right humerus, initial encounter for closed fracture: Secondary | ICD-10-CM | POA: Diagnosis not present

## 2023-06-06 NOTE — Progress Notes (Signed)
Sent message, via epic in basket, requesting orders in epic from surgeon.  

## 2023-06-07 NOTE — Progress Notes (Signed)
COVID Vaccine received:  []  No [x]  Yes Date of any COVID positive Test in last 90 days:  PCP - Tally Joe, MD  at [facility] Cardiologist - none  Chest x-ray -  EKG -  11-03-2022  Epic Stress Test -  ECHO -  Cardiac Cath - Coronary calcium score: 6.10  on 12-27-2022  Epic  PCR screen: [x]  Ordered & Completed []   No Order but Needs PROFEND     []   N/A for this surgery  Surgery Plan:  [x]  Ambulatory   []  Outpatient in bed  []  Admit Anesthesia:    [x]  General  []  Spinal  []   Choice []   MAC  Pacemaker / ICD device [x]  No []  Yes   Spinal Cord Stimulator:[x]  No []  Yes       History of Sleep Apnea? []  No [x]  Yes  mild CPAP used?- [x]  No []  Yes    Does the patient monitor blood sugar?   []  N/A   []  No []  Yes  Patient has: []  NO Hx DM   [x]  Pre-DM   []  DM1  []   DM2 Last A1c was:        on      Does patient have a [employer] or Dexacom? []  No []  Yes   Fasting Blood Sugar Ranges-  Checks Blood Sugar _____ times a day  Blood Thinner / Instructions:  None Aspirin Instructions:  none   ERAS Protocol Ordered: []  No  []  Yes PRE-SURGERY []  ENSURE  []  G2   []  No Drink Ordered  Patient is to be NPO after:     NO ORDERS  Dental hx: []  Dentures:  []  N/A      []  Bridge or Partial:                   []  Loose or Damaged teeth:   Comments:   Activity level: Patient is able / unable to climb a flight of stairs without difficulty; []  No CP  []  No SOB, but would have ___   Patient can / can not perform ADLs without assistance.   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   Patient denies shortness of breath, fever, cough and chest pain at PAT appointment.  Patient verbalized understanding and agreement to the Pre-Surgical Instructions that were given to them at this PAT appointment. Patient was also educated of the need to review these PAT instructions again prior to her surgery.I reviewed the appropriate phone numbers to call if they have any and  questions or concerns.

## 2023-06-07 NOTE — Patient Instructions (Signed)
SURGICAL WAITING ROOM VISITATION Patients having surgery or a procedure may have no more than 2 support people in the waiting area - these visitors may rotate in the visitor waiting room.   Due to an increase in RSV and influenza rates and associated hospitalizations, children ages 69 and under may not visit patients in [facility] hospitals. If the patient needs to stay at the hospital during part of their recovery, the visitor guidelines for inpatient rooms apply.  PRE-OP VISITATION  Pre-op nurse will coordinate an appropriate time for 1 support person to accompany the patient in pre-op.  This support person may not rotate.  This visitor will be contacted when the time is appropriate for the visitor to come back in the pre-op area.  Please refer to the [facility] website for the visitor guidelines for Inpatients (after your surgery is over and you are in a regular room).  You are not required to quarantine at this time prior to your surgery. However, you must do this: Hand Hygiene often Do NOT share personal items Notify your provider if you are in close contact with someone who has COVID or you develop fever 100.4 or greater, new onset of sneezing, cough, sore throat, shortness of breath or body aches.  If you test positive for Covid or have been in contact with anyone that has tested positive in the last 10 days please notify you surgeon.    Your procedure is scheduled on:  Thursday  June 09, 2023  Report to [facility] Main Entrance: Bee entrance where the [employer] is available.   Report to admitting at:  09:30   AM  Call this number if you have any questions or problems the morning of surgery [phone]  Do not eat or drink anything after Midnight the night prior to your surgery/procedure.   FOLLOW ANY ADDITIONAL PRE OP INSTRUCTIONS YOU RECEIVED FROM YOUR SURGEON'S OFFICE!!!   Oral Hygiene is also important to reduce your risk of infection.         Remember - BRUSH YOUR TEETH THE MORNING OF SURGERY WITH YOUR REGULAR TOOTHPASTE  Do NOT smoke after Midnight the night before surgery.   STOP TAKING all Vitamins, Herbs and supplements 1 week before your surgery.   Take ONLY these medicines the morning of surgery with A SIP OF WATER: amlodipine, levothyroxine, omeprazole, You may take Tylenol if needed.    You may not have any metal on your body including hair pins, jewelry, and body piercing  Do not wear make-up, lotions, powders, perfumes  or deodorant  Do not wear nail polish including gel and S&S, artificial / acrylic nails, or any other type of covering on natural nails including finger and toenails. If you have artificial nails, gel coating, etc., that needs to be removed by a nail salon, Please have this removed prior to surgery. Not doing so may mean that your surgery could be cancelled or delayed if the Surgeon or anesthesia staff feels like they are unable to monitor you safely.   Do not shave 48 hours prior to surgery to avoid nicks in your skin which may contribute to postoperative infections.   Contacts, Hearing Aids, dentures or bridgework may not be worn into surgery. DENTURES WILL BE REMOVED PRIOR TO SURGERY PLEASE DO NOT APPLY "Poly grip" OR ADHESIVES!!!  Patients discharged on the day of surgery will not be allowed to drive home.  Someone NEEDS to stay with you for the first 24 hours after anesthesia.  Do not bring your home medications to the hospital. The Pharmacy will dispense medications listed on your medication list to you during your admission in the Hospital.  Special Instructions: Bring a copy of your healthcare power of attorney and living will documents the day of surgery, if you wish to have them scanned into your  Medical Records- EPIC  Please read over the following fact sheets you were given: IF YOU HAVE QUESTIONS ABOUT YOUR PRE-OP INSTRUCTIONS, PLEASE CALL [phone]   [facility] -  Preparing for Surgery Before surgery, you can play an important role.  Because skin is not sterile, your skin needs to be as free of germs as possible.  You can reduce the number of germs on your skin by washing with CHG (chlorahexidine gluconate) soap before surgery.  CHG is an antiseptic cleaner which kills germs and bonds with the skin to continue killing germs even after washing. Please DO NOT use if you have an allergy to CHG or antibacterial soaps.  If your skin becomes reddened/irritated stop using the CHG and inform your nurse when you arrive at Short Stay. Do not shave (including legs and underarms) for at least 48 hours prior to the first CHG shower.  You may shave your face/neck.  Please follow these instructions carefully:  1.  Shower with CHG Soap the night before surgery and the  morning of surgery.  2.  If you choose to wash your hair, wash your hair first as usual with your normal  shampoo.  3.  After you shampoo, rinse your hair and body thoroughly to remove the shampoo.                             4.  Use CHG as you would any other liquid soap.  You can apply chg directly to the skin and wash.  Gently with a scrungie or clean washcloth.  5.  Apply the CHG Soap to your body ONLY FROM THE NECK DOWN.   Do not use on face/ open                           Wound or open sores. Avoid contact with eyes, ears mouth and genitals (private parts).                       Wash face,  Genitals (private parts) with your normal soap.             6.  Wash thoroughly, paying special attention to the area where your  surgery  will be performed.  7.  Thoroughly rinse your body with warm water from the neck down.  8.  DO NOT shower/wash with your normal soap after using and rinsing off the CHG Soap.            9.  Pat yourself dry with a clean towel.            10.  Wear clean pajamas.            11.  Place clean sheets on your bed the night of your first shower and do not  sleep with pets.  ON THE DAY  OF SURGERY : Do not apply any lotions/deodorants the morning of surgery.  Please wear clean clothes to the hospital/surgery center.     FAILURE TO FOLLOW THESE INSTRUCTIONS MAY RESULT IN THE CANCELLATION OF YOUR  SURGERY  PATIENT SIGNATURE_________________________________  NURSE SIGNATURE__________________________________  ________________________________________________________________________

## 2023-06-08 ENCOUNTER — Encounter (HOSPITAL_COMMUNITY): Payer: Self-pay

## 2023-06-08 ENCOUNTER — Other Ambulatory Visit: Payer: Self-pay

## 2023-06-08 ENCOUNTER — Encounter (HOSPITAL_COMMUNITY)
Admission: RE | Admit: 2023-06-08 | Discharge: 2023-06-08 | Disposition: A | Discharge status: Home / Self Care | Payer: Medicare PPO | Source: Ambulatory Visit | Attending: Orthopedic Surgery | Admitting: Orthopedic Surgery

## 2023-06-08 VITALS — BP 150/86 | HR 98 | Temp 98.0°F | Resp 18 | Ht 60.0 in | Wt 142.1 lb

## 2023-06-08 DIAGNOSIS — Z01818 Encounter for other preprocedural examination: Secondary | ICD-10-CM

## 2023-06-08 DIAGNOSIS — I10 Essential (primary) hypertension: Secondary | ICD-10-CM | POA: Insufficient documentation

## 2023-06-08 DIAGNOSIS — Z01812 Encounter for preprocedural laboratory examination: Secondary | ICD-10-CM | POA: Insufficient documentation

## 2023-06-08 LAB — BASIC METABOLIC PANEL
Anion gap: 12 (ref 5–15)
BUN: 10 mg/dL (ref 8–23)
CO2: 22 mmol/L (ref 22–32)
Calcium: 9.4 mg/dL (ref 8.9–10.3)
Chloride: 102 mmol/L (ref 98–111)
Creatinine, Ser: 0.49 mg/dL (ref 0.44–1.00)
GFR, Estimated: >60 mL/min (ref >60)
Glucose, Bld: 106 mg/dL — ABNORMAL HIGH (ref 70–99)
Potassium: 3.8 mmol/L (ref 3.5–5.1)
Sodium: 136 mmol/L (ref 135–145)

## 2023-06-08 LAB — CBC
HCT: 38.5 % (ref 36.0–46.0)
Hemoglobin: 12.5 g/dL (ref 12.0–15.0)
MCH: 30 pg (ref 26.0–34.0)
MCHC: 32.5 g/dL (ref 30.0–36.0)
MCV: 92.5 fL (ref 80.0–100.0)
Platelets: 413 10*3/uL — ABNORMAL HIGH (ref 150–400)
RBC: 4.16 MIL/uL (ref 3.87–5.11)
RDW: 14 % (ref 11.5–15.5)
WBC: 13.6 10*3/uL — ABNORMAL HIGH (ref 4.0–10.5)
nRBC: 0 % (ref 0.0–0.2)

## 2023-06-08 LAB — SURGICAL PCR SCREEN
MRSA, PCR: NEGATIVE
Staphylococcus aureus: NEGATIVE

## 2023-06-09 ENCOUNTER — Ambulatory Visit (HOSPITAL_COMMUNITY): Payer: Self-pay | Admitting: Registered Nurse

## 2023-06-09 ENCOUNTER — Encounter (HOSPITAL_COMMUNITY)
Admission: RE | Disposition: A | Discharge status: Home / Self Care | Payer: Self-pay | Source: Home / Self Care | Attending: Orthopedic Surgery

## 2023-06-09 ENCOUNTER — Ambulatory Visit (HOSPITAL_COMMUNITY)
Admission: RE | Admit: 2023-06-09 | Discharge: 2023-06-10 | Disposition: A | Discharge status: Home / Self Care | Payer: Medicare PPO | Attending: Orthopedic Surgery | Admitting: Orthopedic Surgery

## 2023-06-09 ENCOUNTER — Ambulatory Visit (HOSPITAL_BASED_OUTPATIENT_CLINIC_OR_DEPARTMENT_OTHER): Payer: Medicare PPO | Admitting: Registered Nurse

## 2023-06-09 ENCOUNTER — Other Ambulatory Visit (HOSPITAL_COMMUNITY): Payer: Self-pay

## 2023-06-09 ENCOUNTER — Other Ambulatory Visit: Payer: Self-pay

## 2023-06-09 ENCOUNTER — Encounter (HOSPITAL_COMMUNITY): Payer: Self-pay | Admitting: Orthopedic Surgery

## 2023-06-09 DIAGNOSIS — K449 Diaphragmatic hernia without obstruction or gangrene: Secondary | ICD-10-CM | POA: Insufficient documentation

## 2023-06-09 DIAGNOSIS — Z01818 Encounter for other preprocedural examination: Secondary | ICD-10-CM

## 2023-06-09 DIAGNOSIS — E039 Hypothyroidism, unspecified: Secondary | ICD-10-CM | POA: Insufficient documentation

## 2023-06-09 DIAGNOSIS — I10 Essential (primary) hypertension: Secondary | ICD-10-CM | POA: Diagnosis not present

## 2023-06-09 DIAGNOSIS — G8918 Other acute postprocedural pain: Secondary | ICD-10-CM | POA: Diagnosis not present

## 2023-06-09 DIAGNOSIS — S42241A 4-part fracture of surgical neck of right humerus, initial encounter for closed fracture: Secondary | ICD-10-CM

## 2023-06-09 DIAGNOSIS — Z79899 Other long term (current) drug therapy: Secondary | ICD-10-CM | POA: Insufficient documentation

## 2023-06-09 DIAGNOSIS — W1830XA Fall on same level, unspecified, initial encounter: Secondary | ICD-10-CM | POA: Insufficient documentation

## 2023-06-09 DIAGNOSIS — S42201A Unspecified fracture of upper end of right humerus, initial encounter for closed fracture: Secondary | ICD-10-CM | POA: Diagnosis not present

## 2023-06-09 DIAGNOSIS — G4730 Sleep apnea, unspecified: Secondary | ICD-10-CM | POA: Insufficient documentation

## 2023-06-09 DIAGNOSIS — Z96611 Presence of right artificial shoulder joint: Secondary | ICD-10-CM

## 2023-06-09 DIAGNOSIS — S42291A Other displaced fracture of upper end of right humerus, initial encounter for closed fracture: Secondary | ICD-10-CM | POA: Diagnosis not present

## 2023-06-09 DIAGNOSIS — K219 Gastro-esophageal reflux disease without esophagitis: Secondary | ICD-10-CM | POA: Diagnosis not present

## 2023-06-09 DIAGNOSIS — M25411 Effusion, right shoulder: Secondary | ICD-10-CM | POA: Insufficient documentation

## 2023-06-09 HISTORY — PX: REVERSE SHOULDER ARTHROPLASTY: SHX5054

## 2023-06-09 SURGERY — ARTHROPLASTY, SHOULDER, TOTAL, REVERSE
Anesthesia: Regional | Site: Shoulder | Laterality: Right

## 2023-06-09 MED ORDER — ROCURONIUM BROMIDE 100 MG/10ML IV SOLN
INTRAVENOUS | Status: DC | PRN
  Administered 2023-06-09: 40 mg via INTRAVENOUS

## 2023-06-09 MED ORDER — MENTHOL 3 MG MT LOZG: 1.0000 | LOZENGE | OROMUCOSAL | Status: DC | PRN

## 2023-06-09 MED ORDER — PHENOL 1.4 % MT LIQD: 1.0000 | OROMUCOSAL | Status: DC | PRN

## 2023-06-09 MED ORDER — OXYCODONE HCL 5 MG PO TABS
5.0000 mg | ORAL_TABLET | ORAL | Status: DC | PRN
  Administered 2023-06-09 – 2023-06-10 (×3): 5 mg via ORAL
  Filled 2023-06-09 (×4): qty 1

## 2023-06-09 MED ORDER — METOCLOPRAMIDE HCL 5 MG/ML IJ SOLN: 5.0000 mg | Freq: Three times a day (TID) | INTRAMUSCULAR | Status: DC | PRN

## 2023-06-09 MED ORDER — BUPIVACAINE HCL (PF) 0.5 % IJ SOLN
INTRAMUSCULAR | Status: DC | PRN
  Administered 2023-06-09: 10 mL

## 2023-06-09 MED ORDER — ONDANSETRON HCL 4 MG PO TABS: 4.0000 mg | ORAL_TABLET | Freq: Four times a day (QID) | ORAL | Status: DC | PRN

## 2023-06-09 MED ORDER — DROPERIDOL 2.5 MG/ML IJ SOLN: 0.6250 mg | Freq: Once | INTRAMUSCULAR | Status: DC | PRN

## 2023-06-09 MED ORDER — FENTANYL CITRATE PF 50 MCG/ML IJ SOSY
25.0000 ug | PREFILLED_SYRINGE | INTRAMUSCULAR | Status: DC | PRN
  Administered 2023-06-09 (×3): 50 ug via INTRAVENOUS

## 2023-06-09 MED ORDER — PHENYLEPHRINE HCL-NACL 20-0.9 MG/250ML-% IV SOLN
INTRAVENOUS | Status: DC | PRN
  Administered 2023-06-09: 50 ug/min via INTRAVENOUS

## 2023-06-09 MED ORDER — OXYCODONE-ACETAMINOPHEN 5-325 MG PO TABS
1.0000 | ORAL_TABLET | Freq: Four times a day (QID) | ORAL | 0 refills | Status: DC | PRN
  Filled 2023-06-09: qty 15, 4d supply, fill #0

## 2023-06-09 MED ORDER — PROPOFOL 10 MG/ML IV BOLUS
INTRAVENOUS | Status: DC | PRN
  Administered 2023-06-09: 120 mg via INTRAVENOUS
  Administered 2023-06-09 (×2): 50 mg via INTRAVENOUS

## 2023-06-09 MED ORDER — PHENYLEPHRINE HCL (PRESSORS) 10 MG/ML IV SOLN
INTRAVENOUS | Status: AC
Start: 2023-06-09 — End: ?
  Filled 2023-06-09: qty 1

## 2023-06-09 MED ORDER — PHENYLEPHRINE 80 MCG/ML (10ML) SYRINGE FOR IV PUSH (FOR BLOOD PRESSURE SUPPORT)
PREFILLED_SYRINGE | INTRAVENOUS | Status: DC | PRN
  Administered 2023-06-09 (×2): 160 ug via INTRAVENOUS
  Administered 2023-06-09: 240 ug via INTRAVENOUS
  Administered 2023-06-09 (×4): 160 ug via INTRAVENOUS

## 2023-06-09 MED ORDER — ORAL CARE MOUTH RINSE: 15.0000 mL | Freq: Once | OROMUCOSAL | Status: AC

## 2023-06-09 MED ORDER — OXYCODONE HCL 5 MG PO TABS
5.0000 mg | ORAL_TABLET | Freq: Once | ORAL | Status: AC | PRN
  Administered 2023-06-09: 5 mg via ORAL

## 2023-06-09 MED ORDER — MIDAZOLAM HCL 2 MG/2ML IJ SOLN: 2.0000 mg | INTRAMUSCULAR | Status: DC

## 2023-06-09 MED ORDER — FENTANYL CITRATE PF 50 MCG/ML IJ SOSY
PREFILLED_SYRINGE | INTRAMUSCULAR | Status: AC
  Filled 2023-06-09: qty 1

## 2023-06-09 MED ORDER — LIDOCAINE HCL (CARDIAC) PF 100 MG/5ML IV SOSY
PREFILLED_SYRINGE | INTRAVENOUS | Status: DC | PRN
  Administered 2023-06-09: 60 mg via INTRATRACHEAL

## 2023-06-09 MED ORDER — VANCOMYCIN HCL 1000 MG IV SOLR
INTRAVENOUS | Status: AC
  Filled 2023-06-09: qty 20

## 2023-06-09 MED ORDER — DEXAMETHASONE SODIUM PHOSPHATE 10 MG/ML IJ SOLN
INTRAMUSCULAR | Status: DC | PRN
  Administered 2023-06-09: 10 mg via INTRAVENOUS

## 2023-06-09 MED ORDER — FENTANYL CITRATE (PF) 100 MCG/2ML IJ SOLN
INTRAMUSCULAR | Status: AC
  Filled 2023-06-09: qty 2

## 2023-06-09 MED ORDER — BUPIVACAINE LIPOSOME 1.3 % IJ SUSP
INTRAMUSCULAR | Status: DC | PRN
  Administered 2023-06-09: 10 mL

## 2023-06-09 MED ORDER — 0.9 % SODIUM CHLORIDE (POUR BTL) OPTIME
TOPICAL | Status: DC | PRN
  Administered 2023-06-09: 1000 mL

## 2023-06-09 MED ORDER — METHOCARBAMOL 1000 MG/10ML IJ SOLN: 500.0000 mg | Freq: Four times a day (QID) | INTRAMUSCULAR | Status: DC | PRN

## 2023-06-09 MED ORDER — FENTANYL CITRATE (PF) 100 MCG/2ML IJ SOLN
INTRAMUSCULAR | Status: DC | PRN
  Administered 2023-06-09: 75 ug via INTRAVENOUS
  Administered 2023-06-09: 25 ug via INTRAVENOUS

## 2023-06-09 MED ORDER — TRANEXAMIC ACID-NACL 1000-0.7 MG/100ML-% IV SOLN
1000.0000 mg | INTRAVENOUS | Status: AC
  Administered 2023-06-09: 1000 mg via INTRAVENOUS
  Filled 2023-06-09: qty 100

## 2023-06-09 MED ORDER — ONDANSETRON HCL 4 MG/2ML IJ SOLN: 4.0000 mg | Freq: Four times a day (QID) | INTRAMUSCULAR | Status: DC | PRN

## 2023-06-09 MED ORDER — METOCLOPRAMIDE HCL 5 MG PO TABS: 5.0000 mg | ORAL_TABLET | Freq: Three times a day (TID) | ORAL | Status: DC | PRN

## 2023-06-09 MED ORDER — ACETAMINOPHEN 10 MG/ML IV SOLN
INTRAVENOUS | Status: AC
  Filled 2023-06-09: qty 100

## 2023-06-09 MED ORDER — OXYCODONE HCL 5 MG/5ML PO SOLN
5.0000 mg | Freq: Once | ORAL | Status: AC | PRN
Start: 2023-06-09 — End: 2023-06-09

## 2023-06-09 MED ORDER — VANCOMYCIN HCL 1000 MG IV SOLR
INTRAVENOUS | Status: DC | PRN
  Administered 2023-06-09: 1000 mg

## 2023-06-09 MED ORDER — ONDANSETRON HCL 4 MG PO TABS
4.0000 mg | ORAL_TABLET | Freq: Three times a day (TID) | ORAL | 0 refills | Status: DC | PRN
  Filled 2023-06-09: qty 10, 4d supply, fill #0

## 2023-06-09 MED ORDER — POLYETHYLENE GLYCOL 3350 17 G PO PACK: 17.0000 g | PACK | Freq: Every day | ORAL | Status: DC | PRN

## 2023-06-09 MED ORDER — LACTATED RINGERS IV SOLN: INTRAVENOUS | Status: DC

## 2023-06-09 MED ORDER — FENTANYL CITRATE PF 50 MCG/ML IJ SOSY
PREFILLED_SYRINGE | INTRAMUSCULAR | Status: AC
  Filled 2023-06-09: qty 2

## 2023-06-09 MED ORDER — STERILE WATER FOR IRRIGATION IR SOLN
Status: DC | PRN
  Administered 2023-06-09: 1000 mL

## 2023-06-09 MED ORDER — FENTANYL CITRATE PF 50 MCG/ML IJ SOSY
100.0000 ug | PREFILLED_SYRINGE | INTRAMUSCULAR | Status: DC
  Administered 2023-06-09: 50 ug via INTRAVENOUS
  Filled 2023-06-09: qty 2

## 2023-06-09 MED ORDER — ACETAMINOPHEN 325 MG PO TABS: 325.0000 mg | ORAL_TABLET | Freq: Four times a day (QID) | ORAL | Status: DC | PRN

## 2023-06-09 MED ORDER — HYDROMORPHONE HCL 1 MG/ML IJ SOLN
0.5000 mg | INTRAMUSCULAR | Status: DC | PRN
  Administered 2023-06-09 (×2): 0.5 mg via INTRAVENOUS

## 2023-06-09 MED ORDER — CHLORHEXIDINE GLUCONATE 0.12 % MT SOLN
15.0000 mL | Freq: Once | OROMUCOSAL | Status: AC
  Administered 2023-06-09: 15 mL via OROMUCOSAL

## 2023-06-09 MED ORDER — CEFAZOLIN SODIUM-DEXTROSE 2-4 GM/100ML-% IV SOLN
2.0000 g | INTRAVENOUS | Status: AC
  Administered 2023-06-09: 2 g via INTRAVENOUS
  Filled 2023-06-09: qty 100

## 2023-06-09 MED ORDER — SUGAMMADEX SODIUM 200 MG/2ML IV SOLN
INTRAVENOUS | Status: DC | PRN
  Administered 2023-06-09: 200 mg via INTRAVENOUS

## 2023-06-09 MED ORDER — OXYCODONE HCL 5 MG PO TABS: 10.0000 mg | ORAL_TABLET | ORAL | Status: DC | PRN

## 2023-06-09 MED ORDER — HYDROMORPHONE HCL 1 MG/ML IJ SOLN: 0.5000 mg | INTRAMUSCULAR | Status: DC | PRN

## 2023-06-09 MED ORDER — OXYCODONE HCL 5 MG PO TABS
ORAL_TABLET | ORAL | Status: AC
  Filled 2023-06-09: qty 1

## 2023-06-09 MED ORDER — PANTOPRAZOLE SODIUM 40 MG PO TBEC
40.0000 mg | DELAYED_RELEASE_TABLET | Freq: Every day | ORAL | Status: DC
  Administered 2023-06-10: 40 mg via ORAL
  Filled 2023-06-09: qty 1

## 2023-06-09 MED ORDER — HYDROMORPHONE HCL 1 MG/ML IJ SOLN
INTRAMUSCULAR | Status: AC
  Filled 2023-06-09: qty 1

## 2023-06-09 MED ORDER — DIPHENHYDRAMINE HCL 12.5 MG/5ML PO ELIX: 12.5000 mg | ORAL_SOLUTION | ORAL | Status: DC | PRN

## 2023-06-09 MED ORDER — METHOCARBAMOL 500 MG PO TABS
500.0000 mg | ORAL_TABLET | Freq: Four times a day (QID) | ORAL | 1 refills | Status: DC | PRN
  Filled 2023-06-09: qty 30, 8d supply, fill #0

## 2023-06-09 MED ORDER — METHOCARBAMOL 500 MG PO TABS
500.0000 mg | ORAL_TABLET | Freq: Four times a day (QID) | ORAL | Status: DC | PRN
  Administered 2023-06-09 – 2023-06-10 (×3): 500 mg via ORAL
  Filled 2023-06-09 (×4): qty 1

## 2023-06-09 MED ORDER — ACETAMINOPHEN 10 MG/ML IV SOLN
1000.0000 mg | Freq: Once | INTRAVENOUS | Status: DC | PRN
Start: 2023-06-09 — End: 2023-06-09
  Administered 2023-06-09: 1000 mg via INTRAVENOUS

## 2023-06-09 MED ORDER — ALUM & MAG HYDROXIDE-SIMETH 200-200-20 MG/5ML PO SUSP: 30.0000 mL | ORAL | Status: DC | PRN

## 2023-06-09 SURGICAL SUPPLY — 63 items
BAG COUNTER SPONGE SURGICOUNT (BAG) IMPLANT
BAG ZIPLOCK 12X15 (MISCELLANEOUS) ×1 IMPLANT
BIT DRILL AR 3 NS (BIT) IMPLANT
BLADE SAW SGTL 83.5X18.5 (BLADE) ×1 IMPLANT
BNDG COHESIVE 4X5 TAN STRL LF (GAUZE/BANDAGES/DRESSINGS) ×1 IMPLANT
COOLER ICEMAN CLASSIC (MISCELLANEOUS) ×1 IMPLANT
COVER BACK TABLE 60X90IN (DRAPES) ×1 IMPLANT
COVER SURGICAL LIGHT HANDLE (MISCELLANEOUS) ×1 IMPLANT
CUP SUT UNIV REVERS 36 NEUTRAL (Cup) IMPLANT
DERMABOND ADVANCED .7 DNX12 (GAUZE/BANDAGES/DRESSINGS) ×1 IMPLANT
DERMABOND ADVANCED .7 DNX6 (GAUZE/BANDAGES/DRESSINGS) IMPLANT
DRAPE SHEET LG 3/4 BI-LAMINATE (DRAPES) ×1 IMPLANT
DRAPE SURG 17X11 SM STRL (DRAPES) ×1 IMPLANT
DRAPE SURG ORHT 6 SPLT 77X108 (DRAPES) ×2 IMPLANT
DRAPE TOP 10253 STERILE (DRAPES) ×1 IMPLANT
DRAPE U-SHAPE 47X51 STRL (DRAPES) ×1 IMPLANT
DRESSING AQUACEL AG SP 3.5X6 (GAUZE/BANDAGES/DRESSINGS) ×1 IMPLANT
DRSG AQUACEL AG ADV 3.5X 6 (GAUZE/BANDAGES/DRESSINGS) IMPLANT
DRSG AQUACEL AG ADV 3.5X10 (GAUZE/BANDAGES/DRESSINGS) IMPLANT
DRSG AQUACEL AG SP 3.5X6 (GAUZE/BANDAGES/DRESSINGS) ×1
DURAPREP 26ML APPLICATOR (WOUND CARE) ×1 IMPLANT
ELECT BLADE TIP CTD 4 INCH (ELECTRODE) ×1 IMPLANT
ELECT PENCIL ROCKER SW 15FT (MISCELLANEOUS) ×1 IMPLANT
ELECT REM PT RETURN 15FT ADLT (MISCELLANEOUS) ×1 IMPLANT
FACESHIELD WRAPAROUND (MASK) ×5
FACESHIELD WRAPAROUND OR TEAM (MASK) ×5 IMPLANT
GLENOID UNI REV MOD 24 +2 LAT (Joint) IMPLANT
GLENOSPHERE 36 +4 LAT/24 (Joint) IMPLANT
GLOVE BIO SURGEON STRL SZ7.5 (GLOVE) ×1 IMPLANT
GLOVE BIO SURGEON STRL SZ8 (GLOVE) ×1 IMPLANT
GLOVE SS BIOGEL STRL SZ 7 (GLOVE) ×1 IMPLANT
GLOVE SS BIOGEL STRL SZ 7.5 (GLOVE) ×1 IMPLANT
GOWN STRL SURGICAL XL XLNG (GOWN DISPOSABLE) ×2 IMPLANT
INSERT HUMERAL UNI REVERS 36 3 (Insert) IMPLANT
KIT BASIN OR (CUSTOM PROCEDURE TRAY) ×1 IMPLANT
KIT TURNOVER KIT A (KITS) IMPLANT
MANIFOLD NEPTUNE II (INSTRUMENTS) ×1 IMPLANT
NDL TAPERED W/ NITINOL LOOP (MISCELLANEOUS) ×1 IMPLANT
NEEDLE TAPERED W/ NITINOL LOOP (MISCELLANEOUS) ×1
NS IRRIG 1000ML POUR BTL (IV SOLUTION) ×1 IMPLANT
PACK SHOULDER (CUSTOM PROCEDURE TRAY) ×1 IMPLANT
PAD ARMBOARD 7.5X6 YLW CONV (MISCELLANEOUS) ×1 IMPLANT
PAD COLD SHLDR WRAP-ON (PAD) ×1 IMPLANT
PIN NITINOL TARGETER 2.8 (PIN) IMPLANT
PIN SET MODULAR GLENOID SYSTEM (PIN) IMPLANT
RESTRAINT HEAD UNIVERSAL NS (MISCELLANEOUS) ×1 IMPLANT
SCREW CENTRAL MOD 30MM (Screw) IMPLANT
SCREW PERI LOCK 5.5X16 (Screw) IMPLANT
SCREW PERI LOCK 5.5X24 (Screw) IMPLANT
SCREW PERIPHERAL 5.5X20 LOCK (Screw) IMPLANT
SLING ARM FOAM STRAP LRG (SOFTGOODS) IMPLANT
SLING ARM FOAM STRAP MED (SOFTGOODS) IMPLANT
STEM HUMERAL MOD SZ 5 135 DEG (Stem) IMPLANT
SUT MNCRL AB 3-0 PS2 18 (SUTURE) ×1 IMPLANT
SUT MON AB 2-0 CT1 36 (SUTURE) ×1 IMPLANT
SUT VIC AB 1 CT1 36 (SUTURE) ×1 IMPLANT
SUTURE TAPE 1.3 40 TPR END (SUTURE) ×2 IMPLANT
SUTURETAPE 1.3 40 TPR END (SUTURE) ×6
TOWEL OR 17X26 10 PK STRL BLUE (TOWEL DISPOSABLE) ×1 IMPLANT
TOWEL OR NON WOVEN STRL DISP B (DISPOSABLE) ×1 IMPLANT
TUBE SUCTION HIGH CAP CLEAR NV (SUCTIONS) ×1 IMPLANT
TUBING CONNECTING 10 (TUBING) ×1 IMPLANT
WATER STERILE IRR 1000ML POUR (IV SOLUTION) ×2 IMPLANT

## 2023-06-09 NOTE — Transfer of Care (Signed)
Immediate Anesthesia Transfer of Care Note  Patient: Stacey Coleman  Procedure(s) Performed: REVERSE SHOULDER ARTHROPLASTY (Right: Shoulder)  Patient Location: PACU  Anesthesia Type:GA combined with regional for post-op pain  Level of Consciousness: drowsy and patient cooperative  Airway & Oxygen Therapy: Patient connected to face mask oxygen  Post-op Assessment: Report given to RN and Post -op Vital signs reviewed and stable  Post vital signs: stable  Last Vitals:  Vitals Value Taken Time  BP 136/78 06/09/23 1451  Temp    Pulse 85 06/09/23 1452  Resp 21 06/09/23 1452  SpO2 100 % 06/09/23 1452  Vitals shown include unfiled device data.  Last Pain:  Vitals:   06/09/23 1210  TempSrc:   PainSc: 0-No pain         Complications: No notable events documented.

## 2023-06-09 NOTE — Care Plan (Signed)
Ortho Bundle Case Management Note  Patient Details  Name: TANYRA POTTINGER MRN: [id number] Date of Birth: Dec 26, 1950                  R Rev TSA on 06/09/23.  DCP: Home with son.  DME: No needs.  PT: Ilean China   DME Arranged:  N/A DME Agency:       Additional Comments: Please contact me with any questions of if this plan should need to change.    Despina Pole, CCM Case Manager, [facility]  [phone] 06/09/2023, 11:23 AM

## 2023-06-09 NOTE — Anesthesia Preprocedure Evaluation (Addendum)
Anesthesia Evaluation  Patient identified by MRN, date of birth, ID band Patient awake    Reviewed: Allergy & Precautions, H&P , NPO status , Patient's Chart, lab work & pertinent test results  Airway Mallampati: II  TM Distance: >3 FB Neck ROM: Full    Dental no notable dental hx. (+) Teeth Intact, Dental Advisory Given   Pulmonary sleep apnea    Pulmonary exam normal breath sounds clear to auscultation       Cardiovascular Exercise Tolerance: Good hypertension, Pt. on medications  Rhythm:Regular Rate:Normal     Neuro/Psych negative neurological ROS  negative psych ROS   GI/Hepatic Neg liver ROS, hiatal hernia,GERD  Medicated,,  Endo/Other  Hypothyroidism    Renal/GU negative Renal ROS  negative genitourinary   Musculoskeletal  (+) Arthritis ,    Abdominal   Peds  Hematology  (+) Blood dyscrasia, anemia   Anesthesia Other Findings   Reproductive/Obstetrics negative OB ROS                              Anesthesia Physical Anesthesia Plan  ASA: 3  Anesthesia Plan: General and Regional   Post-op Pain Management: Regional block*   Induction: Intravenous  PONV Risk Score and Plan: 4 or greater and Ondansetron, Dexamethasone and Treatment may vary due to age or medical condition  Airway Management Planned: Oral ETT  Additional Equipment:   Intra-op Plan:   Post-operative Plan: Extubation in OR  Informed Consent: I have reviewed the patients History and Physical, chart, labs and discussed the procedure including the risks, benefits and alternatives for the proposed anesthesia with the patient or authorized representative who has indicated his/her understanding and acceptance.     Dental advisory given  Plan Discussed with: CRNA  Anesthesia Plan Comments:         Anesthesia Quick Evaluation

## 2023-06-09 NOTE — Anesthesia Procedure Notes (Signed)
Procedure Name: Intubation Date/Time: 06/09/2023 12:52 PM  Performed by: Donna Bernard, CRNAPre-anesthesia Checklist: Patient identified, Emergency Drugs available, Suction available, Patient being monitored and Timeout performed Patient Re-evaluated:Patient Re-evaluated prior to induction Oxygen Delivery Method: Circle system utilized Preoxygenation: Pre-oxygenation with 100% oxygen Induction Type: IV induction Ventilation: Mask ventilation without difficulty Laryngoscope Size: Miller and 2 Grade View: Grade I Tube type: Oral Tube size: 7.0 mm Number of attempts: 1 Airway Equipment and Method: Stylet Placement Confirmation: positive ETCO2, ETT inserted through vocal cords under direct vision, CO2 detector and breath sounds checked- equal and bilateral Secured at: 20 cm Tube secured with: Tape Dental Injury: Teeth and Oropharynx as per pre-operative assessment

## 2023-06-09 NOTE — H&P (Signed)
Stacey Coleman    Chief Complaint: Right displaced proximal humerus fracture HPI: The patient is a 72 y.o. female status post mechanical fall sustaining a severely displaced right four-part proximal humerus fracture.  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 Tally Coleman @ [place].   Hypothyroid    Follows w/ PCP, Dr. Tally Coleman @ [place].   Laryngotracheal cleft    laryngotracheal reflux   Nasal congestion    nasal dripping   Osteoporosis    Follows w/ Dr. Cleophas Coleman, orthopedics.   Pre-diabetes    Follows w/ PCP. No meds.   Seasonal allergies    Sleep apnea    mild, no CPAP   Wears hearing aid    both ears   Wears partial dentures       Past Surgical History:  Procedure Laterality Date   ANKLE ARTHROSCOPY  2008   spurs-left   BLADDER SUSPENSION N/A 11/08/2022   Procedure: TRANSVAGINAL TAPE (TVT) PROCEDURE;  Surgeon: Stacey Beards, MD;  Location: [facility];  Service: Gynecology;  Laterality: N/A;   BONE RESECTION  2004   left humerous   CLOSED REDUCTION FINGER WITH PERCUTANEOUS PINNING Right 10/30/2020   Procedure: CLOSED REDUCTION RIGHT SMALL PROXIMAL PHALANX FRACTURE OF  FINGER WITH PERCUTANEOUS PINNING;  Surgeon: Stacey Loa, MD;  Location: [facility];  Service: Orthopedics;  Laterality: Right;   COLONOSCOPY     CYSTOCELE REPAIR N/A 11/08/2022   Procedure: ANTERIOR REPAIR (CYSTOCELE);  Surgeon: Stacey Beards, MD;  Location: [facility];  Service: Gynecology;  Laterality: N/A;   CYSTOSCOPY N/A 11/08/2022   Procedure: CYSTOSCOPY;  Surgeon: Stacey Beards, MD;  Location: [facility];  Service: Gynecology;  Laterality: N/A;   ESOPHAGOGASTRODUODENOSCOPY     hiatal hernia   KNEE ARTHROSCOPY  1990   rt   KNEE ARTHROSCOPY  2002   rt   KNEE ARTHROSCOPY W/ ACL RECONSTRUCTION  2001   right   KNEE ARTHROSCOPY WITH MEDIAL MENISECTOMY Left 12/13/2013   Procedure: LEFT KNEE ARTHROSCOPY WITH MEDIAL MENISECTOMY, PLICA EXCISION, CHONDROPLASTY;  Surgeon: Stacey Ave, MD;  Location: [facility];  Service: Orthopedics;  Laterality: Left;   SHOULDER SURGERY Left 2004   Removed chondro sarcoma   TOTAL KNEE ARTHROPLASTY Right 08/28/2014   Procedure: RIGHT TOTAL KNEE ARTHROPLASTY;  Surgeon: Stacey Ave, MD;  Location: [facility] OR;  Service: Orthopedics;  Laterality: Right;   VAGINAL HYSTERECTOMY N/A 11/08/2022   Procedure: HYSTERECTOMY VAGINAL with possible bilateral salpingo-oophorectomy;  Surgeon: Stacey Beards, MD;  Location: [facility];  Service: Gynecology;  Laterality: N/A;   VAGINAL PROLAPSE REPAIR N/A 11/08/2022   Procedure: VAGINAL VAULT SUSPENSION;  Surgeon: Stacey Beards, MD;  Location: [facility];  Service: Gynecology;  Laterality: N/A;    Family History  Problem Relation Age of Onset   Breast cancer Mother    Heart attack Father  Hypertension Father    Diabetes Brother    Heart attack Maternal Grandfather    Colon cancer Paternal Grandfather    Diabetes Other     Social History:  reports that she has never smoked. She has never used smokeless tobacco. She reports current alcohol use. She reports that she does not use drugs.  BMI: Estimated body mass index is 27.75 kg/m as calculated from the following:   Height as of 06/08/23: 5' (1.524 m).   Weight as of 06/08/23: 64.5 kg.  Lab Results  Component Value Date   ALBUMIN 3.7  08/20/2014   Diabetes: Patient does not have a diagnosis of diabetes.     Smoking Status:   reports that she has never smoked. She has never used smokeless tobacco.     No medications prior to admission.     Physical Exam: Right shoulder clinical exam demonstrates severe diffuse swelling about the shoulder with global tenderness.  Compartments are soft and remains neurovascular intact as noted at recent office visit.  Severe pain with any attempts at passive motion.  Radiographs confirm a severely displaced right four-part proximal humerus fracture.  Vitals  Temp:  [98 F (36.7 C)] 98 F (36.7 C) (11/13 1303) Pulse Rate:  [98] 98 (11/13 1303) Resp:  [18] 18 (11/13 1303) BP: (150)/(86) 150/86 (11/13 1303) SpO2:  [100 %] 100 % (11/13 1303) Weight:  [64.5 kg] 64.5 kg (11/13 1303)  Assessment/Plan  Impression: Right displaced proximal humerus fracture  Plan of Action: Procedure(s): REVERSE SHOULDER ARTHROPLASTY  [name] M Keneth Borg 06/09/2023, 5:47 AM Contact # [phone]

## 2023-06-09 NOTE — Plan of Care (Signed)
  Problem: Coping: Goal: Level of anxiety will decrease Outcome: Progressing   Problem: Pain Management: Goal: General experience of comfort will improve Outcome: Progressing

## 2023-06-09 NOTE — Op Note (Signed)
06/09/2023  2:35 PM  PATIENT:   Stacey Coleman  72 y.o. female  PRE-OPERATIVE DIAGNOSIS:  Right displaced four-part proximal humerus fracture  POST-OPERATIVE DIAGNOSIS: Same  PROCEDURE: Right shoulder reverse arthroplasty lysing a press-fit size 5 Arthrex stem with a neutral metathesis, +3 constrained polyethylene insert, 36/+4 glenosphere and a small/+2 baseplate  SURGEON:  Varun Jourdan, Vania Rea M.D.  ASSISTANTS: Ralene Bathe, PA-C  Ralene Bathe, PA-C was utilized as an Geophysicist/field seismologist throughout this case, essential for help with positioning the patient, positioning extremity, tissue manipulation, implantation of the prosthesis, suture management, wound closure, and intraoperative decision-making.  ANESTHESIA:   General Endotracheal and interscalene block with Exparel rel  EBL: 250 cc  SPECIMEN: None  Drains: None   PATIENT DISPOSITION:  PACU - hemodynamically stable.    PLAN OF CARE: Discharge to home after PACU  Brief history:  Patient is a 72 year old female who had a ground level fall sustaining a severely displaced right four-part proximal humerus fracture.  She is brought to the operating this time for planned right shoulder reverse arthroplasty.  Preoperatively, I counseled the patient regarding treatment options and risks versus benefits thereof.  Possible surgical complications were all reviewed including potential for bleeding, infection, neurovascular injury, persistent pain, loss of motion, anesthetic complication, failure of the implant, and possible need for additional surgery. They understand and accept and agrees with our planned procedure.   Procedure in detail:  After undergoing routine preop evaluation patient received prophylactic antibiotics and interscalene block with Exparel was established in the holding area by the anesthesia department.  Subsequently placed spine on the operating table and underwent the smooth induction of a general endotracheal  anesthesia.  Placed in the beachchair position and appropriately padded and protected.  The right shoulder girdle region was sterilely prepped and draped in standard fashion.  Timeout was called.  Deltopectoral approach to the right shoulder is made to an approximately 8 cm incision.  Skin flaps elevated dissection carried deeply deltopectoral interval was then developed from proximal to this with the vein taken laterally.  A large fracture hematoma was evacuated and dissection carried beneath the deltoid and the conjoined tendon was also mobilized and retracted medially.  The long head biceps tendon was tenotomized and the proximal segment was unroofed and excised and served as delineation between the greater and lesser tuberosities which had separated the long the bicipital groove.  An osteotome was then used to remove the remnant of the articular surface attached to the lesser tuberosity after the subscapularis tendon had been tagged at the bone tendon junction with a pair of grasping suture tape sutures.  At this point the articular segment was then removed with a grasping forcep.  The greater tuberosity was a separate fragment with an appropriate volume bone mass and so this was tagged with a series of 3 grasping suture tape sutures to allow mobilization.  At this point we then removed all residual bony debris and then exposed glenoid and performed a circumferential labral resection.  Guidepin directed into the center of the glenoid and the glenoid was then prepared with the central followed by the peripheral reamer down to a stable subchondral bony bed.  Preparation completed with a drill and tapped for a 30 mm lag screw.  Our baseplate was assembled and was inserted with vancomycin powder applied to the threads of the lag screw and excellent fixation was achieved.  The peripheral locking screws were all then placed using standard technique with excellent fixation.  A 36/+4  glenosphere was then impacted onto  the baseplate and a central locking screw was placed.  Attention returned to the humeral shaft where we initially placed a provisional fiber cerclage around the proximal metaphysis to protect the shaft from propagation of a unicortical fracture during broaching.  Canal was opened and we broached initially to a size 6 but were not able to get a reduction and so downsized to a standard 5 with appropriate seating the lout good soft tissue balance stability and motion with a trial reduction.  The trial was then removed.  A final implant was opened.  We terminally tightened the fiber cerclage and then seated our final size 5 stem utilizing some bone graft harvested from the humeral head around the anterior and posterior margins of the stem to achieve additional fixation and excellent fixation was achieved.  We then performed a trial reduction and felt that the +3 poly gave is the best motion stability and soft tissue balance.  We selected a final +3 constrained poly which was impacted on the implant final reduction was then performed.  We assessed the position of the greater tuberosity and lesser tuberosity with good soft tissue balance and performed a repair using the previously placed sutures through the superior and posterior rotator cuff the bone tendon junction of the greater tuberosity and these were brought down to a limb of the fiber cerclage on the metaphysis and also sutured anteriorly towards the subscapularis which in a similar fashion we repaired back to the lower limb to the humeral shaft using the previously placed fiber cerclage and then sutured transversely to the suture limb from the greater tuberosity and close the rotator interval as well achieving a nice closure of the soft tissue envelope restoring the native soft tissue balance all much to our satisfaction.  The joint was then copious irrigated.  Final hemostasis was obtained.  Balance of the vancomycin powder was spread liberally throughout the  deep soft tissue planes.  Deltopectoral interval reapproximated with a series of figure-of-eight and with Vicryl sutures.  2-0 Monocryl used to close the subcu layer and intracuticular 3-0 Monocryl used to close the skin followed by Dermabond and Aquacel dressing.  The right arm placed into a sling.  The patient was awakened, extubated, and taken to the room in stable condition.  Senaida Lange MD   Contact # [phone]

## 2023-06-09 NOTE — Plan of Care (Signed)
  Problem: Education: Goal: Knowledge of the prescribed therapeutic regimen will improve Outcome: Progressing   Problem: Activity: Goal: Ability to tolerate increased activity will improve Outcome: Progressing   Problem: Pain Management: Goal: Pain level will decrease with appropriate interventions Outcome: Progressing   

## 2023-06-09 NOTE — Discharge Instructions (Signed)
Vania Rea. Supple, M.D., F.A.A.O.S. Orthopaedic Surgery Specializing in Arthroscopic and Reconstructive Surgery of the Shoulder [phone] [address] [place], [place] [address] - Fax [phone]   POST-OP TOTAL SHOULDER REPLACEMENT INSTRUCTIONS  1. Follow up in the office for your first post-op appointment 10-14 days from the date of your surgery. If you do not already have a scheduled appointment, our office will contact you to schedule.  2. The bandage over your incision is waterproof. You may begin showering with this dressing on. You may leave this dressing on until first follow up appointment within 2 weeks. We prefer you leave this dressing in place until follow up however after 5-7 days if you are having itching or skin irritation and would like to remove it you may do so. Go slow and tug at the borders gently to break the bond the dressing has with the skin. At this point if there is no drainage it is okay to go without a bandage or you may cover it with a light guaze and tape. You can also expect significant bruising around your shoulder that will drift down your arm and into your chest wall. This is very normal and should resolve over several days.   3. Wear your sling/immobilizer at all times except to perform the exercises below or to occasionally let your arm dangle by your side to stretch your elbow. You also need to sleep in your sling immobilizer until instructed otherwise. It is ok to remove your sling if you are sitting in a controlled environment and allow your arm to rest in a position of comfort by your side or on your lap with pillows to give your neck and skin a break from the sling. You may remove it to allow arm to dangle by side to shower. If you are up walking around and when you go to sleep at night you need to wear it.  4. Range of motion to your elbow, wrist, and hand are encouraged 3-5 times daily. Exercise to your hand and fingers helps to reduce  swelling you may experience.   5. Prescriptions for a pain medication and a muscle relaxant are provided for you. It is recommended that if you are experiencing pain that you pain medication alone is not controlling, add the muscle relaxant along with the pain medication which can give additional pain relief. The first 1-2 days is generally the most severe of your pain and then should gradually decrease. As your pain lessens it is recommended that you decrease your use of the pain medications to an "as needed basis'" only and to always comply with the recommended dosages of the pain medications.  6. Pain medications can produce constipation along with their use. If you experience this, the use of an over the counter stool softener or laxative daily is recommended.   7. For additional questions or concerns, please do not hesitate to call the office. If after hours there is an answering service to forward your concerns to the physician on call.  8.Pain control following an exparel block  To help control your post-operative pain you received a nerve block  performed with Exparel which is a long acting anesthetic (numbing agent) which can provide pain relief and sensations of numbness (and relief of pain) in the operative shoulder and arm for up to 3 days. Sometimes it provides mixed relief, meaning you may still have numbness in certain areas of the arm but can still be able to  move  parts of that arm, hand, and fingers. We recommend that your prescribed pain medications  be used as needed. We do not feel it is necessary to "pre medicate" and "stay ahead" of pain.  Taking narcotic pain medications when you are not having any pain can lead to unnecessary and potentially dangerous side effects.    9. Use the ice machine as much as possible in the first 5-7 days from surgery, then you can wean its use to as needed. The ice typically needs to be replaced every 6 hours, instead of ice you can actually freeze  water bottles to put in the cooler and then fill water around them to avoid having to purchase ice. You can have spare water bottles freezing to allow you to rotate them once they have melted. Try to have a thin shirt or light cloth or towel under the ice wrap to protect your skin.   FOR ADDITIONAL INFO ON ICE MACHINE AND INSTRUCTIONS GO TO THE WEBSITE AT  [email or web address]  10.  We recommend that you avoid any dental work or cleaning in the first 3 months following your joint replacement. This is to help minimize the possibility of infection from the bacteria in your mouth that enters your bloodstream during dental work. We also recommend that you take an antibiotic prior to your dental work for the first year after your shoulder replacement to further help reduce that risk. Please simply contact our office for antibiotics to be sent to your pharmacy prior to dental work.  11. Dental Antibiotics:  We recommend waiting at least 3 months for any dental work even cleanings unless there is a Actuary. We also recommend  prophylactic antibiotics for all dental procdeures  the first year following your joint replacement. In some exceptions we recommend them to be used lifelong. We will provide you with that prescription in follow up office visits, or you can call our office.  Exceptions are as follows:  1. History of prior total joint infection  2. Severely immunocompromised (Organ Transplant, cancer chemotherapy, Rheumatoid biologic meds such as Humera)  3. Poorly controlled diabetes (A1C &gt; 8.0, blood glucose over 200)   POST-OP EXERCISES  OK to allow arm to dangle and move elbow wrist and hand, no shoulder motion or exercises

## 2023-06-09 NOTE — Anesthesia Postprocedure Evaluation (Signed)
Anesthesia Post Note  Patient: Stacey Coleman  Procedure(s) Performed: REVERSE SHOULDER ARTHROPLASTY (Right: Shoulder)     Patient location during evaluation: PACU Anesthesia Type: Regional and General Level of consciousness: awake and alert Pain management: pain level controlled Vital Signs Assessment: post-procedure vital signs reviewed and stable Respiratory status: spontaneous breathing, nonlabored ventilation, respiratory function stable and patient connected to nasal cannula oxygen Cardiovascular status: blood pressure returned to baseline and stable Postop Assessment: no apparent nausea or vomiting Anesthetic complications: no   No notable events documented.  Last Vitals:  Vitals:   06/09/23 1600 06/09/23 1610  BP: 131/68   Pulse: 86   Resp: 18   Temp:    SpO2: 100% 93%    Last Pain:  Vitals:   06/09/23 1600  TempSrc:   PainSc: 8                  [place]

## 2023-06-09 NOTE — Anesthesia Procedure Notes (Signed)
Anesthesia Regional Block: Interscalene brachial plexus block   Pre-Anesthetic Checklist: , timeout performed,  Correct Patient, Correct Site, Correct Laterality,  Correct Procedure, Correct Position, site marked,  Risks and benefits discussed,  Surgical consent,  Pre-op evaluation,  At surgeon's request and post-op pain management  Laterality: Right  Prep: chloraprep       Needles:  Injection technique: Single-shot  Needle Type: Echogenic Stimulator Needle     Needle Length: 9cm  Needle Gauge: 21     Additional Needles:   Procedures:,,,, ultrasound used (permanent image in chart),,    Narrative:  Start time: 06/09/2023 11:55 AM End time: 06/09/2023 11:58 AM Injection made incrementally with aspirations every 5 mL.  Performed by: Personally  Anesthesiologist: [place], MD  Additional Notes: Discussed risks and benefits of the nerve block in detail, including but not limited vascular injury, permanent nerve damage and infection.   Patient tolerated the procedure well. Local anesthetic introduced in an incremental fashion under minimal resistance after negative aspirations. No paresthesias were elicited. After completion of the procedure, no acute issues were identified and patient continued to be monitored by RN.

## 2023-06-10 ENCOUNTER — Other Ambulatory Visit (HOSPITAL_COMMUNITY): Payer: Self-pay

## 2023-06-10 ENCOUNTER — Encounter (HOSPITAL_COMMUNITY): Payer: Self-pay | Admitting: Orthopedic Surgery

## 2023-06-10 DIAGNOSIS — K219 Gastro-esophageal reflux disease without esophagitis: Secondary | ICD-10-CM | POA: Diagnosis not present

## 2023-06-10 DIAGNOSIS — S42291A Other displaced fracture of upper end of right humerus, initial encounter for closed fracture: Secondary | ICD-10-CM | POA: Diagnosis not present

## 2023-06-10 DIAGNOSIS — M25411 Effusion, right shoulder: Secondary | ICD-10-CM | POA: Diagnosis not present

## 2023-06-10 DIAGNOSIS — I10 Essential (primary) hypertension: Secondary | ICD-10-CM | POA: Diagnosis not present

## 2023-06-10 DIAGNOSIS — E039 Hypothyroidism, unspecified: Secondary | ICD-10-CM | POA: Diagnosis not present

## 2023-06-10 DIAGNOSIS — G4730 Sleep apnea, unspecified: Secondary | ICD-10-CM | POA: Diagnosis not present

## 2023-06-10 DIAGNOSIS — K449 Diaphragmatic hernia without obstruction or gangrene: Secondary | ICD-10-CM | POA: Diagnosis not present

## 2023-06-10 DIAGNOSIS — Z79899 Other long term (current) drug therapy: Secondary | ICD-10-CM | POA: Diagnosis not present

## 2023-06-10 NOTE — Progress Notes (Signed)
   06/10/23 1124  TOC Brief Assessment  Insurance and Status Reviewed  Patient has primary care physician Yes  Home environment has been reviewed home with family  Prior level of function: independent  Social Determinants of Health Reivew SDOH reviewed no interventions necessary  Readmission risk has been reviewed Yes  Transition of care needs no transition of care needs at this time

## 2023-06-10 NOTE — Evaluation (Signed)
Occupational Therapy Evaluation Patient Details Name: Stacey Coleman MRN: [id number] DOB: 08-Aug-1950 Today's Date: 06/10/2023   History of Present Illness Ms. Stacey Coleman is a 72 yr old female who is s/p a R shoulder reverse arthroplasty 06-19-23, due to a displaced 4-part proximal humerus fracture.   Clinical Impression   Pt is s/p shoulder replacement of right dominant upper extremity on 06-09-23. Therapist provided education and instruction to patient with regards to ROM/exercises, post-op precautions, UE and sling positioning, donning upper extremity clothing, recommendations for bathing while maintaining shoulder precautions, use of ice for pain and edema management, use of ice machine, and correctly donning/doffing sling. Patient presented with fair+ recall and teach back abilities. Patient needed assistance to donn shirt, sling, underwear, pants, socks and shoes, with instruction provided on compensatory strategies to perform ADLs. OT to continue to follow pt for further services in the acute care setting to reinforce the aforementioned education provided and to ensure the pt's optimal ADL performance. Patient to follow up with MD for further therapy needs once discharged from the hospital.        If plan is discharge home, recommend the following: Assist for transportation;Assistance with cooking/housework;Help with stairs or ramp for entrance;A little help with bathing/dressing/bathroom    Functional Status Assessment  Patient has had a recent decline in their functional status and demonstrates the ability to make significant improvements in function in a reasonable and predictable amount of time.  Equipment Recommendations  Tub/shower seat    Recommendations for Other Services       Precautions / Restrictions Precautions Precautions: Shoulder Type of Shoulder Precautions: sling on at all times except ADLs/exercise, no shoulder AROM Shoulder Interventions: Shoulder  sling/immobilizer Precaution Booklet Issued: Yes (comment) Precaution Comments: PROM 10 ER, 45 ABD,60 FE , PASSIVE ROM FOR ADL's ONLY, NOT for EXERCISES. OK to exercise elbow wrist and hand rom and for edema control   No pendulums, may allow arm to dangle   Pt may shower     Please instruct on Ice machine use Required Braces or Orthoses: Sling Restrictions Weight Bearing Restrictions: Yes RUE Weight Bearing: Non weight bearing      Mobility Bed Mobility Overal bed mobility: Needs Assistance Bed Mobility: Supine to Sit     Supine to sit: Supervision, HOB elevated, Used rails          Transfers Overall transfer level: Needs assistance Equipment used: None Transfers: Sit to/from Stand Sit to Stand: Contact guard assist                  Balance     Sitting balance-[name]: Good         Standing balance comment: Supervision to CGA                           ADL either performed or assessed with clinical judgement      Pertinent Vitals/Pain Pain Assessment Pain Assessment: 0-10 Pain Score: 3  Pain Location: RUE Pain Intervention(s): Monitored during session, Limited activity within patient's tolerance     Extremity/Trunk Assessment Upper Extremity Assessment Upper Extremity Assessment: Right hand dominant           Communication Communication Communication: No apparent difficulties   Cognition Arousal: Alert Behavior During Therapy: WFL for tasks assessed/performed Overall Cognitive Status: Within Functional Limits for tasks assessed          General Comments: Oriented x4, able to follow commands, friendly  Shoulder Instructions Shoulder Instructions Donning/doffing shirt without moving shoulder: Moderate assistance (with OT assisting and instructing pt on proper technique) Method for sponge bathing under operated UE:  (Patient able to recall with min cues) Donning/doffing sling/immobilizer: Maximal assistance (with  OT assisting and instructing pt on proper technique) Correct positioning of sling/immobilizer: Moderate assistance (with OT assisting and instructing pt on proper technique) Pendulum exercises (written home exercise program):  (N/A) ROM for elbow, wrist and digits of operated UE:  (Pt verbalized understanding) Sling wearing schedule (on at all times/off for ADL's):  (Pt able to recall with min cues) Proper positioning of operated UE when showering:  (Pt able to state with min cues) Dressing change:  (N/A) Positioning of UE while sleeping:  (Pt verbalized understanding)    Home Living Family/patient expects to be discharged to:: Private residence Living Arrangements: Alone      Home Equipment: Agricultural consultant (2 wheels);Cane - single point;Shower seat (equipment available at her home)   Additional Comments: The pt lives alone, however plans to go to [place] to stay with her daughter for a while, once discharged from the hospital. She is unsure of her daughter's exact home set-up, however did indicate she believes her daughter lives in a 2nd story apartment.      Prior Functioning/Environment Prior Level of Function : Independent/Modified Independent;Driving             Mobility Comments: Independent with ambulation. ADLs Comments: Independent with ADLs, driving, cooking, and cleaning. She's a retired Education officer, museum.        OT Problem List: Impaired UE functional use;Decreased range of motion;Pain;Decreased knowledge of precautions      OT Treatment/Interventions: Self-care/ADL training;Therapeutic exercise;DME and/or AE instruction;Therapeutic activities;Patient/family education    OT Goals(Current goals can be found in the care plan section) Acute Rehab OT Goals OT Goal Formulation: With patient Time For Goal Achievement: 06/24/23 Potential to Achieve Goals: Good ADL Goals Pt Will Perform Upper Body Dressing: with supervision;with set-up;sitting Pt Will Perform Lower Body  Dressing: with set-up;with supervision;sit to/from stand Pt Will Transfer to Toilet: with supervision;ambulating Pt Will Perform Toileting - Clothing Manipulation and hygiene: with supervision;sit to/from stand  OT Frequency: Min 1X/week       AM-PAC OT "6 Clicks" Daily Activity     Outcome Measure Help from another person eating meals?: A Little Help from another person taking care of personal grooming?: A Little Help from another person toileting, which includes using toliet, bedpan, or urinal?: A Little Help from another person bathing (including washing, rinsing, drying)?: A Lot Help from another person to put on and taking off regular upper body clothing?: A Lot Help from another person to put on and taking off regular lower body clothing?: A Little 6 Click Score: 16   End of Session Equipment Utilized During Treatment: Other (comment) (N/A) Nurse Communication: Other (comment) (shoulder education completed)  Activity Tolerance: Patient tolerated treatment well Patient left: in bed;with call bell/phone within reach  OT Visit Diagnosis: Muscle weakness (generalized) (M62.81)                Time: 1025-1110 OT Time Calculation (min): 45 min Charges:  OT General Charges $OT Visit: 1 Visit OT Evaluation $OT Eval Moderate Complexity: 1 Mod OT Treatments $Self Care/Home Management : 8-22 mins    Reuben Likes, OTR/L 06/10/2023, 1:07 PM

## 2023-06-10 NOTE — Plan of Care (Signed)
  Problem: Education: Goal: Knowledge of General Education information will improve Description: Including pain rating scale, medication(s)/side effects and non-pharmacologic comfort measures Outcome: Progressing   Problem: Activity: Goal: Risk for activity intolerance will decrease Outcome: Progressing   Problem: Pain Management: Goal: General experience of comfort will improve Outcome: Progressing

## 2023-06-10 NOTE — Discharge Summary (Signed)
PATIENT ID:      Stacey Coleman  MRN:     [id number] DOB/AGE:    10/28/1950 / 72 y.o.     DISCHARGE SUMMARY  ADMISSION DATE:    06/09/2023 DISCHARGE DATE:  06/10/2023  ADMISSION DIAGNOSIS: Right displaced proximal humerus fracture Past Medical History:  Diagnosis Date   Anemia 2013   after multiple blood donations   Arthritis    lower back and left ankle, Follows w/ [name], MD orthopedics.   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 Tally Joe @ [facility].   Hypothyroid    Follows w/ PCP, Dr. Tally Joe @ [facility].   Laryngotracheal cleft    laryngotracheal reflux   Nasal congestion    nasal dripping   Osteoporosis    Follows w/ Dr. Cleophas Dunker, orthopedics.   Pre-diabetes    Follows w/ PCP. No meds.   Seasonal allergies    Sleep apnea    mild, no CPAP   Wears hearing aid    both ears   Wears partial dentures     DISCHARGE DIAGNOSIS:   Principal Problem:   S/P reverse total shoulder arthroplasty, right   PROCEDURE: Procedure(s): REVERSE SHOULDER ARTHROPLASTY on 06/09/2023  CONSULTS:    HISTORY:  See H&P in chart.  HOSPITAL COURSE:  Stacey Coleman is a 72 y.o. admitted on 06/09/2023 with a diagnosis of Right displaced proximal humerus fracture.  They were brought to the operating room on 06/09/2023 and underwent Procedure(s): REVERSE SHOULDER ARTHROPLASTY.    They were given perioperative antibiotics:  Anti-infectives (From admission, onward)    Start     Dose/Rate Route Frequency Ordered Stop   06/09/23 1315  vancomycin (VANCOCIN) powder  Status:  Discontinued          As needed 06/09/23 1315 06/09/23 1804   06/09/23 1015  ceFAZolin (ANCEF) IVPB 2g/100 mL premix        2 g 200 mL/hr over 30 Minutes Intravenous On call to O.R.  06/09/23 1011 06/09/23 1259     .  Patient underwent the above named procedure and tolerated it well. The following day they were hemodynamically stable and pain was controlled on oral analgesics. They were neurovascularly intact to the operative extremity. OT was ordered and worked with patient per protocol. They were medically and orthopaedically stable for discharge on .    DIAGNOSTIC STUDIES:  RECENT RADIOGRAPHIC STUDIES :  CT SHOULDER RIGHT WO CONTRAST  Result Date: 05/28/2023 CLINICAL DATA:  Shoulder trauma, fracture of humerus or scapula possible acute nondisplaced EXAM: CT OF THE UPPER RIGHT EXTREMITY WITHOUT CONTRAST TECHNIQUE: Multidetector CT imaging of the upper right extremity was performed according to the standard protocol. RADIATION DOSE REDUCTION: This exam was performed according to the departmental dose-optimization program which includes automated exposure control, adjustment of the mA and/or kV according to patient size and/or use of iterative reconstruction technique. COMPARISON:  X-ray right humerus 05/28/2023 FINDINGS: Bones/Joint/Cartilage Question acute nondisplaced fracture of the inferior glenoid (6:52, 602:68). Acute displaced and comminuted humeral head and anatomical neck fracture. No scapular spine or body fracture. No chrome in fracture. No coracoid fracture. No clavicular fracture. No acute displaced fractures of the bone visualized ribs. Ligaments Suboptimally assessed by CT. Muscles  and Tendons Heterogeneity of the surrounding musculature likely representing intramuscular hematoma formation. Soft tissues Mild lateral arm severe soft tissue edema and likely underlying hematoma. Other: None IMPRESSION: 1. Acute displaced and comminuted humeral head and anatomical neck fracture. 2. Question acute nondisplaced fracture of the inferior glenoid. Electronically Signed   By: Tish Frederickson M.D.   On: 05/28/2023 16:12   DG Wrist Complete Right  Result Date:  05/28/2023 CLINICAL DATA:  FOOSH, wrist pain EXAM: RIGHT WRIST - COMPLETE 3+ VIEW; RIGHT HAND - COMPLETE 3+ VIEW COMPARISON:  None Available. FINDINGS: Right hand: There is no evidence of fracture or dislocation. Cortical irregularity of the fifth metacarpal body consistent with old healed fracture. Interphalangeal joint degenerative changes. Soft tissues are unremarkable. Right wrist: No evidence of fracture, dislocation, or joint effusion. Severe degenerative changes of the first carpometacarpal joint. Soft tissues are unremarkable. IMPRESSION: No acute displaced fracture or dislocation of the bones of the right hand and wrist. Electronically Signed   By: Tish Frederickson M.D.   On: 05/28/2023 14:54   DG Hand Complete Right  Result Date: 05/28/2023 CLINICAL DATA:  FOOSH, wrist pain EXAM: RIGHT WRIST - COMPLETE 3+ VIEW; RIGHT HAND - COMPLETE 3+ VIEW COMPARISON:  None Available. FINDINGS: Right hand: There is no evidence of fracture or dislocation. Cortical irregularity of the fifth metacarpal body consistent with old healed fracture. Interphalangeal joint degenerative changes. Soft tissues are unremarkable. Right wrist: No evidence of fracture, dislocation, or joint effusion. Severe degenerative changes of the first carpometacarpal joint. Soft tissues are unremarkable. IMPRESSION: No acute displaced fracture or dislocation of the bones of the right hand and wrist. Electronically Signed   By: Tish Frederickson M.D.   On: 05/28/2023 14:54   DG Humerus Right  Result Date: 05/28/2023 CLINICAL DATA:  Pain EXAM: RIGHT HUMERUS - 2+ VIEW COMPARISON:  None Available. FINDINGS: Acute displaced right humeral head and neck fracture. Distally no acute fracture noted of the humerus. No shoulder dislocation. Limited evaluation of the elbow. Soft tissues are unremarkable. IMPRESSION: Acute displaced right humeral head and neck fracture. Electronically Signed   By: Tish Frederickson M.D.   On: 05/28/2023 14:52    RECENT VITAL  SIGNS:  Patient Vitals for the past 24 hrs:  BP Temp Temp src Pulse Resp SpO2 Height Weight  06/10/23 0421 117/78 98.2 F (36.8 C) -- 93 17 96 % -- --  06/10/23 0129 116/74 98.8 F (37.1 C) -- 95 17 95 % -- --  06/09/23 2127 135/83 97.7 F (36.5 C) -- 94 17 95 % -- --  06/09/23 1811 135/75 97.9 F (36.6 C) Oral 85 19 93 % -- --  06/09/23 1715 (!) 146/91 -- -- 85 (!) 21 98 % -- --  06/09/23 1700 (!) 140/82 -- -- 85 19 98 % -- --  06/09/23 1645 136/75 -- -- 78 18 97 % -- --  06/09/23 1630 129/73 -- -- 81 15 90 % -- --  06/09/23 1615 118/62 -- -- 80 15 93 % -- --  06/09/23 1610 -- -- -- -- -- 93 % -- --  06/09/23 1600 131/68 -- -- 86 18 100 % -- --  06/09/23 1557 -- -- -- 85 16 98 % -- --  06/09/23 1545 (!) 117/104 -- -- 73 15 100 % -- --  06/09/23 1530 (!) 122/98 -- -- 81 (!) 26 99 % -- --  06/09/23 1515 124/75 -- -- 85 20 97 % -- --  06/09/23 1500 -- -- -- 86  16 95 % -- --  06/09/23 1452 136/78 98 F (36.7 C) -- 85 (!) 21 100 % -- --  06/09/23 1210 (!) 145/77 -- -- 88 17 99 % -- --  06/09/23 1205 (!) 147/92 -- -- 96 (!) 22 100 % -- --  06/09/23 1200 (!) 153/87 -- -- 95 16 100 % -- --  06/09/23 1155 -- -- -- 91 17 100 % -- --  06/09/23 1050 -- -- -- -- -- -- 5' (1.524 m) 64.5 kg  06/09/23 1029 138/81 98.1 F (36.7 C) Oral 94 18 100 % -- --  .  RECENT EKG RESULTS:    Orders placed or performed during the hospital encounter of 11/03/22   EKG 12 lead per protocol   EKG 12 lead per protocol    DISCHARGE INSTRUCTIONS:    DISCHARGE MEDICATIONS:   Allergies as of 06/10/2023       Reactions   Shellfish Allergy Other (See Comments)   Never had, she doesn't like to eat shrimp.    Strawberry (diagnostic) Hives        Medication List     STOP taking these medications    ibuprofen 600 MG tablet Commonly known as: ADVIL       TAKE these medications    acetaminophen 500 MG tablet Commonly known as: TYLENOL Take 1 tablet (500 mg total) by mouth every 6 (six) hours  as needed (pain). What changed: how much to take   amLODipine 5 MG tablet Commonly known as: NORVASC Take 5 mg by mouth daily.   benazepril-hydrochlorthiazide 20-12.5 MG tablet Commonly known as: LOTENSIN HCT Take 1 tablet by mouth daily.   calcium carbonate 500 MG chewable tablet Commonly known as: TUMS - dosed in mg elemental calcium Chew 1 tablet by mouth daily as needed for indigestion or heartburn.   Calcium Citrate-Mag-Minerals Tabs Take 1 tablet by mouth 3 (three) times daily.   chlorhexidine 0.12 % solution Commonly known as: PERIDEX Use as directed 5 mLs in the mouth or throat 2 (two) times daily.   Cinnamon Extract 500 MG Tabs Take 500 mg by mouth daily.   CULTURELLE DIGESTIVE DAILY PO Take 1 capsule by mouth daily.   denosumab 60 MG/ML Sosy injection Commonly known as: PROLIA Inject 60 mg into the skin every 6 (six) months.   diclofenac Sodium 1 % Gel Commonly known as: VOLTAREN Apply 2 g topically daily as needed (pain).   diphenhydramine-acetaminophen 25-500 MG Tabs tablet Commonly known as: TYLENOL PM Take 0.5 tablets by mouth at bedtime.   estradiol 0.1 MG/GM vaginal cream Commonly known as: ESTRACE Place 0.5 g vaginally 2 (two) times a week. Place 0.5g nightly for two weeks then twice a week after   fexofenadine 180 MG tablet Commonly known as: ALLEGRA Take 180 mg by mouth daily as needed for allergies (In the Spring).   levothyroxine 100 MCG tablet Commonly known as: SYNTHROID Take 100 mcg by mouth daily before breakfast.   methocarbamol 500 MG tablet Commonly known as: ROBAXIN Take 1 tablet (500 mg total) by mouth every 6 (six) hours as needed for muscle spasms.   OMEGA-3 PO Take 1,200 mg by mouth daily.   omeprazole 20 MG capsule Commonly known as: PRILOSEC Take 20 mg by mouth daily.   ondansetron 4 MG tablet Commonly known as: Zofran Take 1 tablet (4 mg total) by mouth every 8 (eight) hours as needed for nausea or vomiting.   OVER  THE COUNTER MEDICATION Apply 1 Application topically  2 (two) times daily as needed (Pain). Helichrysum essential oils   oxyCODONE-acetaminophen 5-325 MG tablet Commonly known as: PERCOCET/ROXICET Take 1 tablet by mouth every 6 (six) hours as needed for severe pain (pain score 7-10). What changed:  when to take this reasons to take this   PEPOGEST PO Take 1 capsule by mouth 3 (three) times daily with meals as needed (IBS).   polyethylene glycol powder 17 GM/SCOOP powder Commonly known as: GLYCOLAX/MIRALAX Take 17 g by mouth daily. Drink 17g (1 scoop) dissolved in water per day.   Relafen 500 MG tablet Generic drug: nabumetone Take 500 mg by mouth daily as needed for mild pain (pain score 1-3) or moderate pain (pain score 4-6).   rosuvastatin 5 MG tablet Commonly known as: CRESTOR Take 5 mg by mouth daily.   Systane Balance 0.6 % Soln Generic drug: Propylene Glycol Place 1 drop into both eyes at bedtime.   triamcinolone 55 MCG/ACT Aero nasal inhaler Commonly known as: NASACORT Place 2 sprays into both nostrils at bedtime.   Vitamin D3 50 MCG (2000 UT) capsule Take 2,000 Units by mouth daily.   vitamin k 100 MCG tablet Take 100 mcg by mouth daily. K2        FOLLOW UP VISIT:    Follow-up Information     Francena Hanly, MD. Go on 06/22/2023.   Specialty: Orthopedic Surgery Why: You are scheduled for first post op appt on Wednesday November 27 at 9:15am. Contact information: [address] [place] [address] [phone]                 DISCHARGE TO: Home with family   DISCHARGE CONDITION:  Eustaquio Maize Demarion Pondexter for Dr. Francena Hanly 06/10/2023, 7:50 AM

## 2023-06-22 DIAGNOSIS — Z96611 Presence of right artificial shoulder joint: Secondary | ICD-10-CM | POA: Diagnosis not present

## 2023-06-22 DIAGNOSIS — Z471 Aftercare following joint replacement surgery: Secondary | ICD-10-CM | POA: Diagnosis not present

## 2023-07-07 DIAGNOSIS — E559 Vitamin D deficiency, unspecified: Secondary | ICD-10-CM | POA: Diagnosis not present

## 2023-07-07 DIAGNOSIS — M545 Low back pain, unspecified: Secondary | ICD-10-CM | POA: Diagnosis not present

## 2023-07-07 DIAGNOSIS — M810 Age-related osteoporosis without current pathological fracture: Secondary | ICD-10-CM | POA: Diagnosis not present

## 2023-07-07 DIAGNOSIS — R5383 Other fatigue: Secondary | ICD-10-CM | POA: Diagnosis not present

## 2023-07-24 ENCOUNTER — Inpatient Hospital Stay (HOSPITAL_COMMUNITY)
Admission: EM | Admit: 2023-07-24 | Discharge: 2023-07-27 | DRG: 541 | Disposition: A | Discharge status: Home Health | Payer: Medicare PPO | Attending: Internal Medicine | Admitting: Internal Medicine

## 2023-07-24 ENCOUNTER — Encounter (HOSPITAL_COMMUNITY): Payer: Self-pay

## 2023-07-24 ENCOUNTER — Other Ambulatory Visit: Payer: Self-pay

## 2023-07-24 DIAGNOSIS — Z90710 Acquired absence of both cervix and uterus: Secondary | ICD-10-CM | POA: Diagnosis not present

## 2023-07-24 DIAGNOSIS — M4626 Osteomyelitis of vertebra, lumbar region: Principal | ICD-10-CM | POA: Diagnosis present

## 2023-07-24 DIAGNOSIS — M810 Age-related osteoporosis without current pathological fracture: Secondary | ICD-10-CM | POA: Diagnosis not present

## 2023-07-24 DIAGNOSIS — R7303 Prediabetes: Secondary | ICD-10-CM | POA: Diagnosis present

## 2023-07-24 DIAGNOSIS — Z8616 Personal history of COVID-19: Secondary | ICD-10-CM | POA: Diagnosis not present

## 2023-07-24 DIAGNOSIS — M1990 Unspecified osteoarthritis, unspecified site: Secondary | ICD-10-CM | POA: Diagnosis present

## 2023-07-24 DIAGNOSIS — G8929 Other chronic pain: Secondary | ICD-10-CM | POA: Diagnosis present

## 2023-07-24 DIAGNOSIS — Z833 Family history of diabetes mellitus: Secondary | ICD-10-CM

## 2023-07-24 DIAGNOSIS — E039 Hypothyroidism, unspecified: Secondary | ICD-10-CM | POA: Diagnosis present

## 2023-07-24 DIAGNOSIS — Z96611 Presence of right artificial shoulder joint: Secondary | ICD-10-CM | POA: Diagnosis present

## 2023-07-24 DIAGNOSIS — Z79890 Hormone replacement therapy (postmenopausal): Secondary | ICD-10-CM

## 2023-07-24 DIAGNOSIS — Z5982 Transportation insecurity: Secondary | ICD-10-CM

## 2023-07-24 DIAGNOSIS — Z8249 Family history of ischemic heart disease and other diseases of the circulatory system: Secondary | ICD-10-CM | POA: Diagnosis not present

## 2023-07-24 DIAGNOSIS — Z91013 Allergy to seafood: Secondary | ICD-10-CM | POA: Diagnosis not present

## 2023-07-24 DIAGNOSIS — Z79899 Other long term (current) drug therapy: Secondary | ICD-10-CM | POA: Diagnosis not present

## 2023-07-24 DIAGNOSIS — Z96651 Presence of right artificial knee joint: Secondary | ICD-10-CM | POA: Diagnosis present

## 2023-07-24 DIAGNOSIS — M4636 Infection of intervertebral disc (pyogenic), lumbar region: Secondary | ICD-10-CM | POA: Diagnosis present

## 2023-07-24 DIAGNOSIS — M545 Low back pain, unspecified: Secondary | ICD-10-CM | POA: Diagnosis not present

## 2023-07-24 DIAGNOSIS — M86172 Other acute osteomyelitis, left ankle and foot: Secondary | ICD-10-CM | POA: Diagnosis not present

## 2023-07-24 DIAGNOSIS — Z91018 Allergy to other foods: Secondary | ICD-10-CM | POA: Diagnosis not present

## 2023-07-24 DIAGNOSIS — K219 Gastro-esophageal reflux disease without esophagitis: Secondary | ICD-10-CM | POA: Diagnosis not present

## 2023-07-24 DIAGNOSIS — Z85830 Personal history of malignant neoplasm of bone: Secondary | ICD-10-CM

## 2023-07-24 DIAGNOSIS — E785 Hyperlipidemia, unspecified: Secondary | ICD-10-CM | POA: Diagnosis not present

## 2023-07-24 DIAGNOSIS — Z974 Presence of external hearing-aid: Secondary | ICD-10-CM | POA: Diagnosis not present

## 2023-07-24 DIAGNOSIS — Z9109 Other allergy status, other than to drugs and biological substances: Secondary | ICD-10-CM | POA: Diagnosis not present

## 2023-07-24 DIAGNOSIS — M869 Osteomyelitis, unspecified: Principal | ICD-10-CM

## 2023-07-24 DIAGNOSIS — I10 Essential (primary) hypertension: Secondary | ICD-10-CM | POA: Diagnosis present

## 2023-07-24 DIAGNOSIS — M4646 Discitis, unspecified, lumbar region: Secondary | ICD-10-CM | POA: Diagnosis not present

## 2023-07-24 LAB — CBC WITH DIFFERENTIAL/PLATELET
Abs Immature Granulocytes: 0.05 10*3/uL (ref 0.00–0.07)
Basophils Absolute: 0 10*3/uL (ref 0.0–0.1)
Basophils Relative: 1 %
Eosinophils Absolute: 0.2 10*3/uL (ref 0.0–0.5)
Eosinophils Relative: 2 %
HCT: 39.1 % (ref 36.0–46.0)
Hemoglobin: 12.4 g/dL (ref 12.0–15.0)
Immature Granulocytes: 1 %
Lymphocytes Relative: 31 %
Lymphs Abs: 2.2 10*3/uL (ref 0.7–4.0)
MCH: 28.2 pg (ref 26.0–34.0)
MCHC: 31.7 g/dL (ref 30.0–36.0)
MCV: 89.1 fL (ref 80.0–100.0)
Monocytes Absolute: 0.7 10*3/uL (ref 0.1–1.0)
Monocytes Relative: 9 %
Neutro Abs: 4.1 10*3/uL (ref 1.7–7.7)
Neutrophils Relative %: 56 %
Platelets: 359 10*3/uL (ref 150–400)
RBC: 4.39 MIL/uL (ref 3.87–5.11)
RDW: 14.1 % (ref 11.5–15.5)
WBC: 7.2 10*3/uL (ref 4.0–10.5)
nRBC: 0 % (ref 0.0–0.2)

## 2023-07-24 LAB — COMPREHENSIVE METABOLIC PANEL
ALT: 13 U/L (ref 0–44)
AST: 20 U/L (ref 15–41)
Albumin: 3.6 g/dL (ref 3.5–5.0)
Alkaline Phosphatase: 69 U/L (ref 38–126)
Anion gap: 10 (ref 5–15)
BUN: 11 mg/dL (ref 8–23)
CO2: 23 mmol/L (ref 22–32)
Calcium: 9.7 mg/dL (ref 8.9–10.3)
Chloride: 103 mmol/L (ref 98–111)
Creatinine, Ser: 0.72 mg/dL (ref 0.44–1.00)
GFR, Estimated: >60 mL/min (ref >60)
Glucose, Bld: 101 mg/dL — ABNORMAL HIGH (ref 70–99)
Potassium: 3.6 mmol/L (ref 3.5–5.1)
Sodium: 136 mmol/L (ref 135–145)
Total Bilirubin: 0.5 mg/dL (ref <1.2)
Total Protein: 7.6 g/dL (ref 6.5–8.1)

## 2023-07-24 LAB — I-STAT CG4 LACTIC ACID, ED: Lactic Acid, Venous: 1.3 mmol/L (ref 0.5–1.9)

## 2023-07-24 LAB — LACTIC ACID, PLASMA: Lactic Acid, Venous: 0.9 mmol/L (ref 0.5–1.9)

## 2023-07-24 LAB — SEDIMENTATION RATE: Sed Rate: 14 mm/h (ref 0–22)

## 2023-07-24 MED ORDER — METRONIDAZOLE 500 MG/100ML IV SOLN: 500.0000 mg | Freq: Once | INTRAVENOUS | Status: DC

## 2023-07-24 MED ORDER — BENAZEPRIL HCL 20 MG PO TABS
20.0000 mg | ORAL_TABLET | Freq: Every day | ORAL | Status: DC
  Administered 2023-07-25 – 2023-07-27 (×3): 20 mg via ORAL
  Filled 2023-07-24 (×4): qty 1

## 2023-07-24 MED ORDER — METHOCARBAMOL 1000 MG/10ML IJ SOLN
500.0000 mg | Freq: Four times a day (QID) | INTRAMUSCULAR | Status: DC | PRN
  Administered 2023-07-26 – 2023-07-27 (×2): 500 mg via INTRAVENOUS
  Filled 2023-07-24 (×2): qty 10

## 2023-07-24 MED ORDER — SODIUM CHLORIDE 0.9 % IV SOLN: 2.0000 g | Freq: Once | INTRAVENOUS | Status: DC

## 2023-07-24 MED ORDER — VANCOMYCIN HCL 1250 MG/250ML IV SOLN
1250.0000 mg | Freq: Once | INTRAVENOUS | Status: DC
  Filled 2023-07-24: qty 250

## 2023-07-24 MED ORDER — LEVOTHYROXINE SODIUM 100 MCG PO TABS
100.0000 ug | ORAL_TABLET | Freq: Every day | ORAL | Status: DC
  Administered 2023-07-25 – 2023-07-27 (×3): 100 ug via ORAL
  Filled 2023-07-24 (×3): qty 1

## 2023-07-24 MED ORDER — ACETAMINOPHEN 650 MG RE SUPP: 650.0000 mg | Freq: Four times a day (QID) | RECTAL | Status: DC | PRN

## 2023-07-24 MED ORDER — SENNOSIDES-DOCUSATE SODIUM 8.6-50 MG PO TABS: 1.0000 | ORAL_TABLET | Freq: Every evening | ORAL | Status: DC | PRN

## 2023-07-24 MED ORDER — PANTOPRAZOLE SODIUM 40 MG PO TBEC
40.0000 mg | DELAYED_RELEASE_TABLET | Freq: Every day | ORAL | Status: DC
Start: 2023-07-25 — End: 2023-07-27
  Administered 2023-07-25 – 2023-07-27 (×3): 40 mg via ORAL
  Filled 2023-07-24 (×3): qty 1

## 2023-07-24 MED ORDER — ACETAMINOPHEN 325 MG PO TABS
650.0000 mg | ORAL_TABLET | Freq: Four times a day (QID) | ORAL | Status: DC | PRN
  Administered 2023-07-26: 650 mg via ORAL
  Filled 2023-07-24: qty 2

## 2023-07-24 MED ORDER — AMLODIPINE BESYLATE 5 MG PO TABS
5.0000 mg | ORAL_TABLET | Freq: Every day | ORAL | Status: DC
  Administered 2023-07-25 – 2023-07-27 (×3): 5 mg via ORAL
  Filled 2023-07-24 (×3): qty 1

## 2023-07-24 MED ORDER — ROSUVASTATIN CALCIUM 5 MG PO TABS
5.0000 mg | ORAL_TABLET | Freq: Every day | ORAL | Status: DC
  Administered 2023-07-25 – 2023-07-27 (×3): 5 mg via ORAL
  Filled 2023-07-24 (×3): qty 1

## 2023-07-24 MED ORDER — OXYCODONE HCL 5 MG PO TABS
2.5000 mg | ORAL_TABLET | ORAL | Status: DC | PRN
  Administered 2023-07-27: 2.5 mg via ORAL
  Filled 2023-07-24: qty 1

## 2023-07-24 MED ORDER — BENAZEPRIL-HYDROCHLOROTHIAZIDE 20-12.5 MG PO TABS: 1.0000 | ORAL_TABLET | Freq: Every day | ORAL | Status: DC

## 2023-07-24 MED ORDER — HYDROCHLOROTHIAZIDE 12.5 MG PO TABS
12.5000 mg | ORAL_TABLET | Freq: Every day | ORAL | Status: DC
  Administered 2023-07-25 – 2023-07-27 (×3): 12.5 mg via ORAL
  Filled 2023-07-24 (×4): qty 1

## 2023-07-24 NOTE — ED Provider Notes (Incomplete)
[place] EMERGENCY DEPARTMENT AT [facility] Provider Note   CSN: [id number] Arrival date & time: 07/24/23  1400     History {Add pertinent medical, surgical, social history, OB history to HPI:1} Chief Complaint  Patient presents with   Osteomyelitis     Stacey Coleman is a 72 y.o. female history of osteoporosis, chronic back pain, here presenting with back pain.  Patient had back pain worsening since September.  Patient unfortunately had a shoulder fracture and November so was unable to get MRI.  Patient finally had MRI done on December 14.  The read came back today that showed osteomyelitis of L4.  There was no epidural abscess however.  Patient received a call today and was told to come to the ER.  Patient states that she has some numbness of the right knee.  She denies any fevers.  She adamantly denies any IV drug use.  She states that she is supposed to get a epidural injection by her doctor but none was done recently.  Denies any focal weakness.  Please see MRI report below   MRI report 1. Edematous changes involving the left posterosuperior aspect of the L4 vertebral body and extending into the left pedicle have increased since the prior exam. Additionally, T2 signal changes along the inferior aspect of the L3 vertebral body on the left as well as minimal fluid and enhancement in the left side of the disc. 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 2. Moderate central canal stenosis and moderate to severe foraminal narrowing bilaterally at L3-4. 3. Moderate subarticular narrowing at L4-5 is worse on the right. 4. Severe right and moderate left foraminal stenosis at L4-5 is stable. 5. Mild subarticular and foraminal stenosis bilaterally at L5-S1 is stable. 6. Mild foraminal narrowing bilaterally at L2-3 is stable.   HPI     Home Medications Prior to Admission medications   Medication Sig Start Date End Date Taking? Authorizing Provider   acetaminophen (TYLENOL) 500 MG tablet Take 1 tablet (500 mg total) by mouth every 6 (six) hours as needed (pain). Patient taking differently: Take 500-1,000 mg by mouth every 6 (six) hours as needed (pain). 10/25/22   Selmer Dominion, NP  amLODipine (NORVASC) 5 MG tablet Take 5 mg by mouth daily.    [provider]  benazepril-hydrochlorthiazide (LOTENSIN HCT) 20-12.5 MG per tablet Take 1 tablet by mouth daily.    [provider]  calcium carbonate (TUMS - DOSED IN MG ELEMENTAL CALCIUM) 500 MG chewable tablet Chew 1 tablet by mouth daily as needed for indigestion or heartburn.    [provider]  chlorhexidine (PERIDEX) 0.12 % solution Use as directed 5 mLs in the mouth or throat 2 (two) times daily. 05/25/23   [provider]  Cholecalciferol (VITAMIN D3) 2000 units capsule Take 2,000 Units by mouth daily.    [provider]  Cinnamon Extract 500 MG TABS Take 500 mg by mouth daily.    [provider]  denosumab (PROLIA) 60 MG/ML SOSY injection Inject 60 mg into the skin every 6 (six) months.    [provider]  diclofenac Sodium (VOLTAREN) 1 % GEL Apply 2 g topically daily as needed (pain).    [provider]  diphenhydramine-acetaminophen (TYLENOL PM) 25-500 MG TABS tablet Take 0.5 tablets by mouth at bedtime.    [provider]  estradiol (ESTRACE) 0.1 MG/GM vaginal cream Place 0.5 g vaginally 2 (two) times a week. Place 0.5g nightly for two weeks then  twice a week after 12/27/22   Selmer Dominion, NP  fexofenadine (ALLEGRA) 180 MG tablet Take 180 mg by mouth daily as needed for allergies (In the Spring).    [provider]  Lactobacillus-Inulin (CULTURELLE DIGESTIVE DAILY PO) Take 1 capsule by mouth daily.    [provider]  levothyroxine (SYNTHROID) 100 MCG tablet Take 100 mcg by mouth daily before breakfast.    [provider]  methocarbamol (ROBAXIN) 500 MG tablet Take 1 tablet (500 mg  total) by mouth every 6 (six) hours as needed for muscle spasms. 06/09/23   Shuford, French Ana, PA-C  Multiple Minerals-Vitamins (CALCIUM CITRATE-MAG-MINERALS) TABS Take 1 tablet by mouth 3 (three) times daily.    [provider]  nabumetone (RELAFEN) 500 MG tablet Take 500 mg by mouth daily as needed for mild pain (pain score 1-3) or moderate pain (pain score 4-6). 06/11/20   [provider]  Omega-3 Fatty Acids (OMEGA-3 PO) Take 1,200 mg by mouth daily.    [provider]  omeprazole (PRILOSEC) 20 MG capsule Take 20 mg by mouth daily. 08/11/22   [provider]  ondansetron (ZOFRAN) 4 MG tablet Take 1 tablet (4 mg total) by mouth every 8 (eight) hours as needed for nausea or vomiting. 06/09/23   Shuford, French Ana, PA-C  OVER THE COUNTER MEDICATION Apply 1 Application topically 2 (two) times daily as needed (Pain). Helichrysum essential oils    [provider]  oxyCODONE-acetaminophen (PERCOCET/ROXICET) 5-325 MG tablet Take 1 tablet by mouth every 6 (six) hours as needed for severe pain (pain score 7-10). 06/09/23   Shuford, French Ana, PA-C  Peppermint Oil (PEPOGEST PO) Take 1 capsule by mouth 3 (three) times daily with meals as needed (IBS).    [provider]  polyethylene glycol powder (GLYCOLAX/MIRALAX) 17 GM/SCOOP powder Take 17 g by mouth daily. Drink 17g (1 scoop) dissolved in water per day. Patient not taking: Reported on 06/03/2023 10/25/22   Selmer Dominion, NP  Propylene Glycol (SYSTANE BALANCE) 0.6 % SOLN Place 1 drop into both eyes at bedtime.    [provider]  rosuvastatin (CRESTOR) 5 MG tablet Take 5 mg by mouth daily. 12/31/22   [provider]  triamcinolone (NASACORT) 55 MCG/ACT AERO nasal inhaler Place 2 sprays into both nostrils at bedtime.    [provider]  vitamin k 100 MCG tablet Take 100 mcg by mouth daily. K2    [provider]      Allergies    Grass pollen(k-o-r-t-swt vern), Shellfish allergy,  and Strawberry (diagnostic)    Review of Systems   Review of Systems  Musculoskeletal:  Positive for back pain.  All other systems reviewed and are negative.   Physical Exam Updated Vital Signs BP (!) 153/75 (BP Location: Left Arm)   Pulse 95   Temp 98.5 F (36.9 C)   Resp 15   Ht 5' (1.524 m)   Wt 64.4 kg   SpO2 98%   BMI 27.73 kg/m  Physical Exam Vitals and nursing note reviewed.  Constitutional:      Appearance: Normal appearance.  HENT:     Head: Normocephalic.     Nose: Nose normal.     Mouth/Throat:     Mouth: Mucous membranes are moist.  Eyes:     Extraocular Movements: Extraocular movements intact.     Pupils: Pupils are equal, round, and reactive to light.  Cardiovascular:     Rate and Rhythm: Normal rate and regular rhythm.  Pulses: Normal pulses.     Heart sounds: Normal heart sounds.  Pulmonary:     Effort: Pulmonary effort is normal.     Breath sounds: Normal breath sounds.  Abdominal:     General: Abdomen is flat.     Palpations: Abdomen is soft.  Musculoskeletal:     Cervical back: Normal range of motion and neck supple.     Comments: Mild lower lumbar tenderness.  Skin:    General: Skin is warm.     Capillary Refill: Capillary refill takes less than 2 seconds.  Neurological:     General: No focal deficit present.     Mental Status: She is alert and oriented to person, place, and time.     Comments: Patient has no saddle anesthesia.  Normal strength bilateral legs.  Normal reflexes bilateral knees  Psychiatric:        Mood and Affect: Mood normal.        Behavior: Behavior normal.     ED Results / Procedures / Treatments   Labs (all labs ordered are listed, but only abnormal results are displayed) Labs Reviewed  COMPREHENSIVE METABOLIC PANEL - Abnormal; Notable for the following components:      Result Value   Glucose, Bld 101 (*)    All other components within normal limits  CULTURE, BLOOD (ROUTINE X 2)  CULTURE, BLOOD (ROUTINE X  2)  CBC WITH DIFFERENTIAL/PLATELET  LACTIC ACID, PLASMA  I-STAT CG4 LACTIC ACID, ED    EKG None  Radiology No results found.  Procedures Procedures  {Document cardiac monitor, telemetry assessment procedure when appropriate:1}  Medications Ordered in ED Medications  metroNIDAZOLE (FLAGYL) IVPB 500 mg (has no administration in time range)  vancomycin (VANCOREADY) IVPB 1250 mg/250 mL (has no administration in time range)  ceFEPIme (MAXIPIME) 2 g in sodium chloride 0.9 % 100 mL IVPB (has no administration in time range)    ED Course/ Medical Decision Making/ A&P   {   Click here for ABCD2, HEART and other calculatorsREFRESH Note before signing :1}                              Medical Decision Making Stacey Coleman is a 72 y.o. female here presenting with osteomyelitis.  Patient has chronic back pain and MRI 2 weeks ago showed osteomyelitis of L4.  Patient's pain is under control.  Patient has no saddle anesthesia or incontinence.  Plan to get labs and give broad-spectrum antibiotics and consult Ortho.  6:51 PM I discussed case with on-call doctor for [facility].  He states that they do not have a spine surgeon and recommended neurosurgery consult.  6:54 PM Discussed case with Dr. Yetta Barre from neurosurgery.  He states that he already knew about the case.  He states that he can see the patient as consult but recommend broad-spectrum antibiotics and blood cultures and infectious disease consult in the morning.  He states that he does not plan to do any intervention so patient can eat   Problems Addressed: Osteomyelitis of other site, unspecified type [facility]): acute illness or injury  Amount and/or Complexity of Data Reviewed Labs: ordered.    Final Clinical Impression(s) / ED Diagnoses Final diagnoses:  None    Rx / DC Orders ED Discharge Orders     None

## 2023-07-24 NOTE — H&P (Signed)
History and Physical    Stacey Coleman ZOX:[id number] DOB: 09-06-1950 DOA: 07/24/2023  PCP: Tally Joe, MD   Patient coming from: Home   Chief Complaint: Back pain, possible lumbar spine infection   HPI: Stacey Coleman is a 72 y.o. female with medical history significant for hypertension, hyperlipidemia, hypothyroidism, and chronic back pain who presents with back pain and outpatient imaging concerning for possible osteomyelitis involving the lumbar spine.   Patient reports a history of chronic low back pain.  This has been more severe recently and is localized to just left of the midline low back.  She had previously experienced some tingling in her right leg but that resolved.  She denies focal weakness.  She denies any fever or chills.  She reports undergoing recent MRI, was told that there was concern for infection, and she was directed to the ED.  ED Course: Upon arrival to the ED, patient is found to be afebrile and saturating well on room air with normal heart rate and stable blood pressure.  Labs are notable for normal renal function, normal WBC, and normal lactic acid.  Neurosurgery was consulted by the ED physician, blood cultures were collected, and hospitalists asked to admit.  Review of Systems:  All other systems reviewed and apart from HPI, are negative.  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 Tally Joe @ [place].   Hypothyroid    Follows w/ PCP, Dr. Tally Joe @ [place].   Laryngotracheal cleft    laryngotracheal reflux   Nasal congestion    nasal dripping    Osteoporosis    Follows w/ Dr. Cleophas Dunker, orthopedics.   Pre-diabetes    Follows w/ PCP. No meds.   Seasonal allergies    Sleep apnea    mild, no CPAP   Wears hearing aid    both ears   Wears partial dentures     Past Surgical History:  Procedure Laterality Date   ANKLE ARTHROSCOPY  2008   spurs-left   BLADDER SUSPENSION N/A 11/08/2022   Procedure: TRANSVAGINAL TAPE (TVT) PROCEDURE;  Surgeon: Marguerita Beards, MD;  Location: [facility];  Service: Gynecology;  Laterality: N/A;   BONE RESECTION  2004   left humerous   CLOSED REDUCTION FINGER WITH PERCUTANEOUS PINNING Right 10/30/2020   Procedure: CLOSED REDUCTION RIGHT SMALL PROXIMAL PHALANX FRACTURE OF  FINGER WITH PERCUTANEOUS PINNING;  Surgeon: Betha Loa, MD;  Location: [facility];  Service: Orthopedics;  Laterality: Right;   COLONOSCOPY     CYSTOCELE REPAIR N/A 11/08/2022   Procedure: ANTERIOR REPAIR (CYSTOCELE);  Surgeon: Marguerita Beards, MD;  Location: [facility];  Service: Gynecology;  Laterality: N/A;   CYSTOSCOPY N/A 11/08/2022   Procedure: CYSTOSCOPY;  Surgeon: Marguerita Beards, MD;  Location: [facility];  Service: Gynecology;  Laterality: N/A;   ESOPHAGOGASTRODUODENOSCOPY     hiatal hernia   KNEE ARTHROSCOPY  1990   rt   KNEE ARTHROSCOPY  2002   rt   KNEE ARTHROSCOPY  W/ ACL RECONSTRUCTION  2001   right   KNEE ARTHROSCOPY WITH MEDIAL MENISECTOMY Left 12/13/2013   Procedure: LEFT KNEE ARTHROSCOPY WITH MEDIAL MENISECTOMY, PLICA EXCISION, CHONDROPLASTY;  Surgeon: Loreta Ave, MD;  Location: [facility];  Service: Orthopedics;  Laterality: Left;   REVERSE SHOULDER ARTHROPLASTY Right 06/09/2023   Procedure: REVERSE SHOULDER ARTHROPLASTY;  Surgeon: Francena Hanly, MD;  Location: [facility];  Service: Orthopedics;  Laterality: Right;  interscalene block 110   SHOULDER SURGERY Left 2004   Removed chondro sarcoma   TOTAL KNEE  ARTHROPLASTY Right 08/28/2014   Procedure: RIGHT TOTAL KNEE ARTHROPLASTY;  Surgeon: Loreta Ave, MD;  Location: [facility];  Service: Orthopedics;  Laterality: Right;   VAGINAL HYSTERECTOMY N/A 11/08/2022   Procedure: HYSTERECTOMY VAGINAL with possible bilateral salpingo-oophorectomy;  Surgeon: Marguerita Beards, MD;  Location: [facility];  Service: Gynecology;  Laterality: N/A;   VAGINAL PROLAPSE REPAIR N/A 11/08/2022   Procedure: VAGINAL VAULT SUSPENSION;  Surgeon: Marguerita Beards, MD;  Location: [facility];  Service: Gynecology;  Laterality: N/A;    Social History:   reports that she has never smoked. She has never used smokeless tobacco. She reports current alcohol use. She reports that she does not use drugs.  Allergies  Allergen Reactions   Grass Pollen(K-O-R-T-Swt Vern) Other (See Comments)   Shellfish Allergy Other (See Comments)    Never had, she doesn't like to eat shrimp.    Strawberry (Diagnostic) Hives    Family History  Problem Relation Age of Onset   Breast cancer Mother    Heart attack Father    Hypertension Father    Diabetes Brother    Heart attack Maternal Grandfather    Colon cancer Paternal Grandfather    Diabetes Other      Prior to Admission medications   Medication Sig Start Date End Date Taking? Authorizing Provider  acetaminophen (TYLENOL) 500 MG tablet Take 1 tablet (500 mg total) by mouth every 6 (six) hours as needed (pain). Patient taking differently: Take 500-1,000 mg by mouth every 6 (six) hours as needed (pain). 10/25/22  Yes Selmer Dominion, NP  amLODipine (NORVASC) 5 MG tablet Take 5 mg by mouth daily.   Yes [provider]  benazepril-hydrochlorthiazide (LOTENSIN HCT) 20-12.5 MG per tablet Take 1 tablet by mouth daily.   Yes [provider]  calcium carbonate (TUMS - DOSED IN MG ELEMENTAL CALCIUM) 500 MG chewable tablet Chew 1 tablet by mouth daily as needed for indigestion or heartburn.    Yes [provider]  Cholecalciferol (VITAMIN D3) 2000 units capsule Take 2,000 Units by mouth daily.   Yes [provider]  Cinnamon Extract 500 MG TABS Take 500 mg by mouth daily.   Yes [provider]  denosumab (PROLIA) 60 MG/ML SOSY injection Inject 60 mg into the skin every 6 (six) months. Next Injection 08/20/2023.   Yes [provider]  diclofenac Sodium (VOLTAREN) 1 % GEL Apply 2 g topically daily as needed (pain).   Yes [provider]  diphenhydramine-acetaminophen (TYLENOL PM) 25-500 MG TABS tablet Take 0.5 tablets by mouth at bedtime.   Yes [provider]  estradiol (ESTRACE) 0.1 MG/GM vaginal cream Place 0.5 g vaginally 2 (two) times a week. Place 0.5g nightly for two weeks then twice a week after 12/27/22  Yes Zuleta, Joan Mayans, NP  Lactobacillus-Inulin (CULTURELLE DIGESTIVE DAILY PO) Take 1 capsule by mouth daily.   Yes [provider]  levothyroxine (SYNTHROID) 100  MCG tablet Take 100 mcg by mouth daily before breakfast.   Yes [provider]  methocarbamol (ROBAXIN) 500 MG tablet Take 1 tablet (500 mg total) by mouth every 6 (six) hours as needed for muscle spasms. 06/09/23  Yes Shuford, French Ana, PA-C  Omega-3 Fatty Acids (OMEGA-3 PO) Take 1,200 mg by mouth daily.   Yes [provider]  omeprazole (PRILOSEC) 20 MG capsule Take 20 mg by mouth daily. 08/11/22  Yes [provider]  OVER THE COUNTER MEDICATION Apply 1 Application topically 2 (two) times daily as needed (Pain). Helichrysum essential oils   Yes [provider]  Peppermint Oil (PEPOGEST PO) Take 1 capsule by mouth 3 (three) times daily with meals as needed (IBS).   Yes [provider]  Propylene Glycol (SYSTANE BALANCE) 0.6 % SOLN Place 1 drop into both eyes at bedtime.   Yes [provider]  rosuvastatin (CRESTOR) 5 MG tablet Take 5 mg by mouth daily. 12/31/22  Yes [provider]  triamcinolone  (NASACORT) 55 MCG/ACT AERO nasal inhaler Place 2 sprays into both nostrils at bedtime.   Yes [provider]  vitamin k 100 MCG tablet Take 100 mcg by mouth daily. K2   Yes [provider]  fexofenadine (ALLEGRA) 180 MG tablet Take 180 mg by mouth daily as needed for allergies (In the Spring). Patient not taking: Reported on 07/24/2023    [provider]    Physical Exam: Vitals:   07/24/23 1432 07/24/23 1442  BP: (!) 153/75   Pulse: 95   Resp: 15   Temp: 98.5 F (36.9 C)   SpO2: 98%   Weight:  64.4 kg  Height:  5' (1.524 m)    Constitutional: NAD, calm  Eyes: PERTLA, lids and conjunctivae normal ENMT: Mucous membranes are moist. Posterior pharynx clear of any exudate or lesions.   Neck: supple, no masses  Respiratory: no wheezing, no crackles. No accessory muscle use.  Cardiovascular: S1 & S2 heard, regular rate and rhythm. No JVD. Abdomen: No distension, no tenderness, soft. Bowel sounds active.  Musculoskeletal: no clubbing / cyanosis. Right arm in sling.   Skin: no significant rashes, lesions, ulcers. Warm, dry, well-perfused. Neurologic: CN 2-12 grossly intact. Sensation to light touch intact. Strength 5/5 in all 4 limbs. Alert and oriented.  Psychiatric: Pleasant. Cooperative.    Labs and Imaging on Admission: I have personally reviewed following labs and imaging studies  CBC: Recent Labs  Lab 07/24/23 1511  WBC 7.2  NEUTROABS 4.1  HGB 12.4  HCT 39.1  MCV 89.1  PLT 359   Basic Metabolic Panel: Recent Labs  Lab 07/24/23 1511  NA 136  K 3.6  CL 103  CO2 23  GLUCOSE 101*  BUN 11  CREATININE 0.72  CALCIUM 9.7   GFR: Estimated Creatinine Clearance: 51.7 mL/min (by C-G formula based on SCr of 0.72 mg/dL). Liver Function Tests: Recent Labs  Lab 07/24/23 1511  AST 20  ALT 13  ALKPHOS 69  BILITOT 0.5  PROT 7.6  ALBUMIN 3.6   No results for input(s): "LIPASE", "AMYLASE" in the last 168 hours. No results for input(s):  "AMMONIA" in the last 168 hours. Coagulation Profile: No results for input(s): "INR", "PROTIME" in the last 168 hours. Cardiac Enzymes: No results for input(s): "CKTOTAL", "CKMB", "CKMBINDEX", "TROPONINI" in the last 168 hours. BNP (last 3 results) No results for input(s): "PROBNP" in the last 8760 hours. HbA1C: No results for input(s): "HGBA1C" in the last 72 hours. CBG: No results  for input(s): "GLUCAP" in the last 168 hours. Lipid Profile: No results for input(s): "CHOL", "HDL", "LDLCALC", "TRIG", "CHOLHDL", "LDLDIRECT" in the last 72 hours. Thyroid Function Tests: No results for input(s): "TSH", "T4TOTAL", "FREET4", "T3FREE", "THYROIDAB" in the last 72 hours. Anemia Panel: No results for input(s): "VITAMINB12", "FOLATE", "FERRITIN", "TIBC", "IRON", "RETICCTPCT" in the last 72 hours. Urine analysis:    Component Value Date/Time   COLORURINE YELLOW 08/20/2014 1610   APPEARANCEUR HAZY (A) 08/20/2014 1610   LABSPEC 1.008 08/20/2014 1610   PHURINE 5.5 08/20/2014 1610   GLUCOSEU NEGATIVE 08/20/2014 1610   HGBUR NEGATIVE 08/20/2014 1610   BILIRUBINUR Negative 11/26/2022 1108   KETONESUR NEGATIVE 08/20/2014 1610   PROTEINUR Negative 11/26/2022 1108   PROTEINUR NEGATIVE 08/20/2014 1610   UROBILINOGEN 0.2 11/26/2022 1108   UROBILINOGEN 0.2 08/20/2014 1610   NITRITE Negative 11/26/2022 1108   NITRITE NEGATIVE 08/20/2014 1610   LEUKOCYTESUR Small (1+) (A) 11/26/2022 1108   Sepsis Labs: @LABRCNTIP (procalcitonin:4,lacticidven:4) )No results found for this or any previous visit (from the past 240 hours).   Radiological Exams on Admission: No results found.   Assessment/Plan   1. Osteomyelitis of lumbar spine  - Outpatient imaging was concerning for osteomyelitis of lumbar spine  - She is not septic, there is no focal deficit, and no abscess on imaging per report  - Check ESR and CRP, hold antibiotics for now, consult IR for consideration of CT-guided biopsy for culture and  histopathology, consult ID in am   2. Hypertension  - Norvasc, benazepril-HCTZ  3. HLD  - Crestor   4. Hypothyroidism  - Synthroid     DVT prophylaxis: SCDs  Code Status: Full  Level of Care: Level of care: Med-Surg Family Communication: None present   Disposition Plan:  Patient is from: Home   Anticipated d/c is to: TBD Anticipated d/c date is: 07/27/23  Patient currently: Pending neurosurgery and likely ID consult, IR eval for biopsy  Consults called: Neurosurgery  Admission status: Inpatient     Briscoe Deutscher, MD [facility]  07/24/2023, 7:34 PM

## 2023-07-24 NOTE — ED Provider Triage Note (Cosign Needed Addendum)
Emergency Medicine Provider Triage Evaluation Note  Stacey Coleman , a 72 y.o. female  was evaluated in triage.  Pt complains of low back painx several months. Had MRI done at [facility] with contrast. Was called by Dr [name] for osteo of spine. No loss of bowel or bladder.  MRI was done at [facility] in L3/L4.  Review of Systems  Positive: Low back pain Negative: Loss of bladder  Physical Exam  BP (!) 153/75 (BP Location: Left Arm)   Pulse 95   Temp 98.5 F (36.9 C)   Resp 15   SpO2 98%  Gen:   Awake, no distress   Resp:  Normal effort  MSK:   Moves extremities without difficulty  Medical Decision Making  Medically screening exam initiated at 2:35 PM.  Appropriate orders placed.  Hermelinda Dellen was informed that the remainder of the evaluation will be completed by another provider, this initial triage assessment does not replace that evaluation, and the importance of remaining in the ED until their evaluation is complete.    Pete Pelt, PA 07/24/23 1437    Pete Pelt, [place] 07/24/23 1438

## 2023-07-24 NOTE — Consult Note (Signed)
I was consulted by Dr. Donavan Foil at this afternoon regarding this patient.  She has had chronic back pain.  She had a lumbar MRI on 05/17/2023 which demonstrated degenerative changes.  She had a repeat CT scan on 07/07/2023 which demonstrated some increased inflammatory changes  at L3-4 possibly indicative of a spontaneous infection.  There is no operative the lesion.    I recommend the patient be admitted, get C reactive protein, sed rate, blood cultures and infectious disease consult tomorrow.  We will see the patient tomorrow.  But this is not a surgical problem.

## 2023-07-24 NOTE — Progress Notes (Signed)
ED Pharmacy Antibiotic Sign Off An antibiotic consult was received from an ED provider for osteomyelitis per pharmacy dosing for vancomycin, cefepime. A chart review was completed to assess appropriateness.   The following one time order(s) were placed:  Vancomycin 1250 x1  Cefepime 2g IV x1  Further antibiotic and/or antibiotic pharmacy consults should be ordered by the admitting provider if indicated.   Thank you for allowing pharmacy to be a part of this patient's care.   De Burrs, [facility]  Clinical Pharmacist 07/24/23 2:49 PM

## 2023-07-24 NOTE — ED Triage Notes (Signed)
Patient had a scan and has osteomyelitis of her spine and sent by MD for IV antibiotics

## 2023-07-25 ENCOUNTER — Inpatient Hospital Stay (HOSPITAL_COMMUNITY): Payer: Medicare PPO

## 2023-07-25 ENCOUNTER — Encounter (HOSPITAL_COMMUNITY): Payer: Self-pay | Admitting: Family Medicine

## 2023-07-25 DIAGNOSIS — M4626 Osteomyelitis of vertebra, lumbar region: Secondary | ICD-10-CM | POA: Diagnosis not present

## 2023-07-25 HISTORY — PX: IR FLUORO GUIDED NEEDLE PLC ASPIRATION/INJECTION LOC: IMG2395

## 2023-07-25 LAB — BASIC METABOLIC PANEL
Anion gap: 10 (ref 5–15)
BUN: 12 mg/dL (ref 8–23)
CO2: 21 mmol/L — ABNORMAL LOW (ref 22–32)
Calcium: 9.1 mg/dL (ref 8.9–10.3)
Chloride: 107 mmol/L (ref 98–111)
Creatinine, Ser: 0.65 mg/dL (ref 0.44–1.00)
GFR, Estimated: >60 mL/min (ref >60)
Glucose, Bld: 122 mg/dL — ABNORMAL HIGH (ref 70–99)
Potassium: 3.5 mmol/L (ref 3.5–5.1)
Sodium: 138 mmol/L (ref 135–145)

## 2023-07-25 LAB — CBC
HCT: 35.2 % — ABNORMAL LOW (ref 36.0–46.0)
Hemoglobin: 11.1 g/dL — ABNORMAL LOW (ref 12.0–15.0)
MCH: 28 pg (ref 26.0–34.0)
MCHC: 31.5 g/dL (ref 30.0–36.0)
MCV: 88.9 fL (ref 80.0–100.0)
Platelets: 341 10*3/uL (ref 150–400)
RBC: 3.96 MIL/uL (ref 3.87–5.11)
RDW: 14.1 % (ref 11.5–15.5)
WBC: 7.6 10*3/uL (ref 4.0–10.5)
nRBC: 0 % (ref 0.0–0.2)

## 2023-07-25 LAB — BODY FLUID CELL COUNT WITH DIFFERENTIAL: Total Nucleated Cell Count, Fluid: 6 uL (ref 0–1000)

## 2023-07-25 LAB — PROTIME-INR
INR: 1.1 (ref 0.8–1.2)
Prothrombin Time: 14.3 s (ref 11.4–15.2)

## 2023-07-25 LAB — C-REACTIVE PROTEIN: CRP: 1.8 mg/dL — ABNORMAL HIGH (ref <1.0)

## 2023-07-25 LAB — CBG MONITORING, ED: Glucose-Capillary: 82 mg/dL (ref 70–99)

## 2023-07-25 MED ORDER — MIDAZOLAM HCL 2 MG/2ML IJ SOLN
INTRAMUSCULAR | Status: AC
  Filled 2023-07-25: qty 2

## 2023-07-25 MED ORDER — LIDOCAINE HCL (PF) 1 % IJ SOLN
30.0000 mL | Freq: Once | INTRAMUSCULAR | Status: DC
  Filled 2023-07-25: qty 30

## 2023-07-25 MED ORDER — SODIUM CHLORIDE 0.9 % IV SOLN
2.0000 g | INTRAVENOUS | Status: DC
  Administered 2023-07-25 – 2023-07-27 (×3): 2 g via INTRAVENOUS
  Filled 2023-07-25 (×3): qty 20

## 2023-07-25 MED ORDER — LIDOCAINE HCL (PF) 1 % IJ SOLN
INTRAMUSCULAR | Status: AC
  Filled 2023-07-25: qty 30

## 2023-07-25 MED ORDER — MIDAZOLAM HCL 2 MG/2ML IJ SOLN
INTRAMUSCULAR | Status: AC | PRN
  Administered 2023-07-25: .5 mg via INTRAVENOUS
  Administered 2023-07-25: 1 mg via INTRAVENOUS

## 2023-07-25 MED ORDER — LIDOCAINE HCL (PF) 1 % IJ SOLN
30.0000 mL | Freq: Once | INTRAMUSCULAR | Status: DC
  Filled 2023-07-25 (×2): qty 30

## 2023-07-25 MED ORDER — DAPTOMYCIN-SODIUM CHLORIDE 500-0.9 MG/50ML-% IV SOLN
500.0000 mg | Freq: Every day | INTRAVENOUS | Status: DC
  Administered 2023-07-26 – 2023-07-27 (×2): 500 mg via INTRAVENOUS
  Filled 2023-07-25 (×3): qty 50

## 2023-07-25 MED ORDER — FENTANYL CITRATE (PF) 100 MCG/2ML IJ SOLN
INTRAMUSCULAR | Status: AC | PRN
  Administered 2023-07-25 (×3): 25 ug via INTRAVENOUS

## 2023-07-25 MED ORDER — FENTANYL CITRATE (PF) 100 MCG/2ML IJ SOLN
INTRAMUSCULAR | Status: AC
  Filled 2023-07-25: qty 2

## 2023-07-25 NOTE — Progress Notes (Signed)
PROGRESS NOTE  DILARA WYLLIE  FAO:[id number] DOB: 10-Aug-1950 DOA: 07/24/2023 PCP: Tally Joe, MD   Brief Narrative: Patient is a 26 female with history of hypertension, hyperlipidemia, hypothyroidism, chronic back pain who presented with acute onset of new back pain.  Outpatient imaging concerning for possible osteomyelitis involving the lumbar spine.  No focal weakness.  Neurosurgery consulted.  Blood cultures collected.  IR consulted for CT-guided biopsy/culture.  ID will be consulted eventually  Assessment & Plan:  Principal Problem:   Osteomyelitis of lumbar spine (HCC) Active Problems:   Essential hypertension   Hyperlipidemia   Hypothyroid    Osteomyelitis of lumbar spine: Presented with new onset of back pain.  No focal deficits.  No fever or leukocytosis.  Outpatient imaging was concerning for osteomyelitis of lumbar spine.  It showed edematous changes involving the left posterior superior aspect of the L4 vertebral body, findings consistent with this osteomyelitis. IR consulted for CT-guided tissue sampling.   Will consult ID at some point.  Will initiate antibiotics after discharge patient has done.  Neurosurgery signed off. Currently back pain is well-controlled  Hypertension: Currently blood pressure stable.  On Norvasc, benazepril-hydrochlorothiazide at home  Hyperlipidemia: On Crestor  Hypothyroidism: On Synthyroid       DVT prophylaxis:SCDs Start: 07/24/23 1933     Code Status: Full Code  Family Communication: None at bedside  Patient status:Inpatient  Patient is from :home  Anticipated discharge ON:GEXB  Estimated DC date:after full work up   Consultants: Neurosurgery  Procedures:None  Antimicrobials:  Anti-infectives (From admission, onward)    Start     Dose/Rate Route Frequency Ordered Stop   07/24/23 1500  vancomycin (VANCOREADY) IVPB 1250 mg/250 mL  Status:  Discontinued        1,250 mg 166.7 mL/hr over 90 Minutes Intravenous  Once  07/24/23 1448 07/24/23 1910   07/24/23 1500  ceFEPIme (MAXIPIME) 2 g in sodium chloride 0.9 % 100 mL IVPB  Status:  Discontinued        2 g 200 mL/hr over 30 Minutes Intravenous  Once 07/24/23 1448 07/24/23 1910   07/24/23 1445  metroNIDAZOLE (FLAGYL) IVPB 500 mg  Status:  Discontinued        500 mg 100 mL/hr over 60 Minutes Intravenous  Once 07/24/23 1441 07/24/23 1910       Subjective: Patient seen and examined at bedside today.  Hemodynamically stable.  Sitting on the bed.  Denies any significant back pain today.  Afebrile  Objective: Vitals:   07/24/23 2034 07/24/23 2218 07/25/23 0335 07/25/23 0821  BP:  137/83 124/62 127/76  Pulse:  82 91 81  Resp:  16 16 16   Temp: 98.3 F (36.8 C) 98.3 F (36.8 C) 98.2 F (36.8 C) 98.7 F (37.1 C)  TempSrc: Oral Oral Oral   SpO2:  100% 98% 97%  Weight:      Height:       No intake or output data in the 24 hours ending 07/25/23 0839 Filed Weights   07/24/23 1442  Weight: 64.4 kg    Examination:  General exam: Overall comfortable, not in distress HEENT: PERRL Respiratory system:  no wheezes or crackles  Cardiovascular system: S1 & S2 heard, RRR.  Gastrointestinal system: Abdomen is nondistended, soft and nontender. Central nervous system: Alert and oriented Extremities: No edema, no clubbing ,no cyanosis Skin: No rashes, no ulcers,no icterus     Data Reviewed: I have personally reviewed following labs and imaging studies  CBC: Recent Labs  Lab 07/24/23  1511 07/25/23 0349  WBC 7.2 7.6  NEUTROABS 4.1  --   HGB 12.4 11.1*  HCT 39.1 35.2*  MCV 89.1 88.9  PLT 359 341   Basic Metabolic Panel: Recent Labs  Lab 07/24/23 1511 07/25/23 0349  NA 136 138  K 3.6 3.5  CL 103 107  CO2 23 21*  GLUCOSE 101* 122*  BUN 11 12  CREATININE 0.72 0.65  CALCIUM 9.7 9.1     No results found for this or any previous visit (from the past 240 hours).   Radiology Studies: No results found.  Scheduled Meds:  amLODipine  5 mg  Oral Daily   benazepril  20 mg Oral Daily   And   hydrochlorothiazide  12.5 mg Oral Daily   levothyroxine  100 mcg Oral Q0600   pantoprazole  40 mg Oral Daily   rosuvastatin  5 mg Oral Daily   Continuous Infusions:   LOS: 1 day   Burnadette Pop, MD [facility] P12/30/2024, 8:39 AM

## 2023-07-25 NOTE — Progress Notes (Signed)
Pharmacy Antibiotic Note  Stacey Coleman is a 72 y.o. female admitted on 07/24/2023 with discitis/osteo. Pharmacy has been consulted to start Daptomycin + Ceftriaxone after IR aspirate is completed.   The patient is noted to be in IR now to attempt an aspiration. If successful - will plan to start Daptomycin + Ceftriaxone post-procedure.   Plan: - Start Daptomycin 500 mg IV every 24 hours - Start Rocephin 2g IV every 24 hours - If IR aspirate not obtained, will hold antibiotics pending additional plans  Height: 5' (152.4 cm) Weight: 64.4 kg (142 lb) IBW/kg (Calculated) : 45.5  Temp (24hrs), Avg:98.4 F (36.9 C), Min:98.2 F (36.8 C), Max:98.7 F (37.1 C)  Recent Labs  Lab 07/24/23 1511 07/24/23 1845 07/25/23 0349  WBC 7.2  --  7.6  CREATININE 0.72  --  0.65  LATICACIDVEN 0.9 1.3  --     Estimated Creatinine Clearance: 51.7 mL/min (by C-G formula based on SCr of 0.65 mg/dL).    Allergies  Allergen Reactions   Grass Pollen(K-O-R-T-Swt Vern) Other (See Comments)   Shellfish Allergy Other (See Comments)    Never had, she doesn't like to eat shrimp.    Strawberry (Diagnostic) Hives    Antimicrobials this admission: Daptomycin 12/30 >> Ceftriaxone 12/30 >>  Dose adjustments this admission:   Microbiology results: 12/29 BCx >> 12/30 IR asp >>  Thank you for allowing pharmacy to be a part of this patient's care.  Georgina Pillion, PharmD, BCPS, BCIDP Infectious Diseases Clinical Pharmacist 07/25/2023 3:26 PM   **Pharmacist phone directory can now be found on [email or web address] ([email or web address]).  Listed under [facility].

## 2023-07-25 NOTE — Consult Note (Addendum)
Chief Complaint: Patient was seen in consultation today for L3-L4 osteomyelitis.   Referring Physician(s): Dr. Tressie Stalker  Supervising Physician: Joana Reamer Kizzie Fantasia  Patient Status: [facility] - Out-pt  History of Present Illness: Stacey Coleman is a 72 y.o. female with history of anemia, arthritis, GERD, HTN, pre-diabetes who presents with back pain.  She has recently been followed as an outpatient for same and underwent MRI Lumbar Spine 07/07/23 showing inflammatory changes at L3-4 and concern was raised for discitis/osteomyelitis. She is admitted for same.  IR consulted for aspiration prior to starting IV abx. On MRI review, changes appear centered on the facet joint with no signal change in the intervertebral disc. Therefore, facet aspiration was recommended.  Patient assessed at bedside. She is understanding of the goals of the procedure and is agreeable to proceed.   She has been NPO.   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 Tally Joe @ [place].   Hypothyroid    Follows w/ PCP, Dr. Tally Joe @ [place].   Laryngotracheal cleft    laryngotracheal reflux   Nasal congestion    nasal dripping   Osteoporosis    Follows w/ Dr. Cleophas Dunker, orthopedics.   Pre-diabetes    Follows w/ PCP. No meds.   Seasonal allergies    Sleep apnea    mild, no CPAP   Wears hearing aid    both ears   Wears partial dentures     Past Surgical History:  Procedure Laterality Date   ANKLE ARTHROSCOPY  2008   spurs-left   BLADDER SUSPENSION N/A 11/08/2022   Procedure: TRANSVAGINAL  TAPE (TVT) PROCEDURE;  Surgeon: Marguerita Beards, MD;  Location: [facility];  Service: Gynecology;  Laterality: N/A;   BONE RESECTION  2004   left humerous   CLOSED REDUCTION FINGER WITH PERCUTANEOUS PINNING Right 10/30/2020   Procedure: CLOSED REDUCTION RIGHT SMALL PROXIMAL PHALANX FRACTURE OF  FINGER WITH PERCUTANEOUS PINNING;  Surgeon: Betha Loa, MD;  Location: [facility];  Service: Orthopedics;  Laterality: Right;   COLONOSCOPY     CYSTOCELE REPAIR N/A 11/08/2022   Procedure: ANTERIOR REPAIR (CYSTOCELE);  Surgeon: Marguerita Beards, MD;  Location: [facility];  Service: Gynecology;  Laterality: N/A;   CYSTOSCOPY N/A 11/08/2022   Procedure: CYSTOSCOPY;  Surgeon: Marguerita Beards, MD;  Location: [facility];  Service: Gynecology;  Laterality: N/A;   ESOPHAGOGASTRODUODENOSCOPY     hiatal hernia   KNEE ARTHROSCOPY  1990   rt   KNEE ARTHROSCOPY  2002   rt   KNEE ARTHROSCOPY W/ ACL RECONSTRUCTION  2001   right   KNEE ARTHROSCOPY WITH MEDIAL MENISECTOMY Left 12/13/2013   Procedure: LEFT KNEE ARTHROSCOPY WITH MEDIAL MENISECTOMY, PLICA EXCISION, CHONDROPLASTY;  Surgeon: Loreta Ave, MD;  Location: [facility];  Service: Orthopedics;  Laterality: Left;   REVERSE SHOULDER ARTHROPLASTY Right 06/09/2023   Procedure: REVERSE SHOULDER ARTHROPLASTY;  Surgeon: Francena Hanly, MD;  Location: [facility];  Service: Orthopedics;  Laterality:  Right;  interscalene block 110   SHOULDER SURGERY Left 2004   Removed chondro sarcoma   TOTAL KNEE ARTHROPLASTY Right 08/28/2014   Procedure: RIGHT TOTAL KNEE ARTHROPLASTY;  Surgeon: Loreta Ave, MD;  Location: [facility];  Service: Orthopedics;  Laterality: Right;   VAGINAL HYSTERECTOMY N/A 11/08/2022   Procedure: HYSTERECTOMY VAGINAL with possible bilateral salpingo-oophorectomy;  Surgeon: Marguerita Beards, MD;  Location: [facility];  Service: Gynecology;   Laterality: N/A;   VAGINAL PROLAPSE REPAIR N/A 11/08/2022   Procedure: VAGINAL VAULT SUSPENSION;  Surgeon: Marguerita Beards, MD;  Location: [facility];  Service: Gynecology;  Laterality: N/A;    Allergies: Grass pollen(k-o-r-t-swt vern), Shellfish allergy, and Strawberry (diagnostic)  Medications: Prior to Admission medications   Medication Sig Start Date End Date Taking? Authorizing Provider  acetaminophen (TYLENOL) 500 MG tablet Take 1 tablet (500 mg total) by mouth every 6 (six) hours as needed (pain). Patient taking differently: Take 500-1,000 mg by mouth every 6 (six) hours as needed (pain). 10/25/22  Yes Selmer Dominion, NP  amLODipine (NORVASC) 5 MG tablet Take 5 mg by mouth daily.   Yes [provider]  benazepril-hydrochlorthiazide (LOTENSIN HCT) 20-12.5 MG per tablet Take 1 tablet by mouth daily.   Yes [provider]  calcium carbonate (TUMS - DOSED IN MG ELEMENTAL CALCIUM) 500 MG chewable tablet Chew 1 tablet by mouth daily as needed for indigestion or heartburn.   Yes [provider]  Cholecalciferol (VITAMIN D3) 2000 units capsule Take 2,000 Units by mouth daily.   Yes [provider]  Cinnamon Extract 500 MG TABS Take 500 mg by mouth daily.   Yes [provider]  denosumab (PROLIA) 60 MG/ML SOSY injection Inject 60 mg into the skin every 6 (six) months. Next Injection 08/20/2023.   Yes [provider]  diclofenac Sodium (VOLTAREN) 1 % GEL Apply 2 g topically daily as needed (pain).   Yes [provider]  diphenhydramine-acetaminophen (TYLENOL PM) 25-500 MG TABS tablet Take 0.5 tablets by mouth at bedtime.   Yes [provider]  estradiol (ESTRACE) 0.1 MG/GM vaginal cream Place 0.5 g vaginally 2 (two) times a week. Place 0.5g nightly for two weeks then twice a week after 12/27/22  Yes Zuleta, Joan Mayans, NP  Lactobacillus-Inulin (CULTURELLE DIGESTIVE DAILY PO) Take 1 capsule by mouth daily.    Yes [provider]  levothyroxine (SYNTHROID) 100 MCG tablet Take 100 mcg by mouth daily before breakfast.   Yes [provider]  methocarbamol (ROBAXIN) 500 MG tablet Take 1 tablet (500 mg total) by mouth every 6 (six) hours as needed for muscle spasms. 06/09/23  Yes Shuford, French Ana, PA-C  Omega-3 Fatty Acids (OMEGA-3 PO) Take 1,200 mg by mouth daily.   Yes [provider]  omeprazole (PRILOSEC) 20 MG capsule Take 20 mg by mouth daily. 08/11/22  Yes [provider]  OVER THE COUNTER MEDICATION Apply 1 Application topically 2 (two) times daily as needed (Pain). Helichrysum essential oils   Yes [provider]  Peppermint Oil (PEPOGEST PO) Take 1 capsule by mouth 3 (three) times daily with meals as needed (IBS).   Yes [provider]  Propylene Glycol (SYSTANE BALANCE) 0.6 % SOLN Place 1 drop into both eyes at bedtime.   Yes [provider]  rosuvastatin (CRESTOR) 5 MG tablet Take 5 mg by mouth daily. 12/31/22  Yes [provider]  triamcinolone (NASACORT) 55 MCG/ACT AERO nasal inhaler Place 2 sprays into both nostrils  at bedtime.   Yes [provider]  vitamin k 100 MCG tablet Take 100 mcg by mouth daily. K2   Yes [provider]  fexofenadine (ALLEGRA) 180 MG tablet Take 180 mg by mouth daily as needed for allergies (In the Spring). Patient not taking: Reported on 07/24/2023    [provider]     Family History  Problem Relation Age of Onset   Breast cancer Mother    Heart attack Father    Hypertension Father    Diabetes Brother    Heart attack Maternal Grandfather    Colon cancer Paternal Grandfather    Diabetes Other     Social History   Socioeconomic History   Marital status: Widowed    Spouse name: Not on file   Number of children: Not on file   Years of education: Not on file   Highest education level: Not on file  Occupational History   Occupation: retired Runner, broadcasting/film/video  Tobacco Use    Smoking status: Never   Smokeless tobacco: Never  Vaping Use   Vaping status: Never Used  Substance and Sexual Activity   Alcohol use: Yes    Comment: occasional   Drug use: No   Sexual activity: Not Currently  Other Topics Concern   Not on file  Social History Narrative   Retired Runner, broadcasting/film/video, 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   Social Drivers of Corporate investment banker Strain: Not on file  Food Insecurity: No Food Insecurity (07/25/2023)   Hunger Vital Sign    Worried About Running Out of Food in the Last Year: Never true    Ran Out of Food in the Last Year: Never true  Transportation Needs: Unmet Transportation Needs (07/25/2023)   PRAPARE - Administrator, Civil Service (Medical): No    Lack of Transportation (Non-Medical): Yes  Physical Activity: Not on file  Stress: Not on file  Social Connections: Moderately Integrated (07/25/2023)   Social Connection and Isolation Panel [NHANES]    Frequency of Communication with Friends and Family: More than three times a week    Frequency of Social Gatherings with Friends and Family: More than three times a week    Attends Religious Services: 1 to 4 times per year    Active Member of [employer] or Organizations: Yes    Attends Banker Meetings: More than 4 times per year    Marital Status: Widowed     Review of Systems: A 12 point ROS discussed and pertinent positives are indicated in the HPI above.  All other systems are negative.  Review of Systems  Constitutional:  Negative for fatigue and fever.  Respiratory:  Negative for cough and shortness of breath.   Cardiovascular:  Negative for chest pain.  Gastrointestinal:  Negative for abdominal pain, nausea and vomiting.  Musculoskeletal:  Positive for back pain.  Psychiatric/Behavioral:  Negative for behavioral problems and confusion.     Vital Signs: BP 137/79 (BP Location: Left Arm)   Pulse 82   Temp 98.4 F (36.9 C) (Oral)   Resp 16   Ht 5' (1.524 m)   Wt 142  lb (64.4 kg)   SpO2 100%   BMI 27.73 kg/m   Physical Exam Vitals and nursing note reviewed.  Constitutional:      General: She is not in acute distress.    Appearance: Normal appearance. She is not ill-appearing.  HENT:     Mouth/Throat:     Mouth: Mucous membranes are moist.  Pharynx: Oropharynx is clear.  Cardiovascular:     Rate and Rhythm: Normal rate and regular rhythm.  Pulmonary:     Effort: Pulmonary effort is normal. No respiratory distress.     Breath sounds: Normal breath sounds.  Abdominal:     General: Abdomen is flat. There is no distension.     Palpations: Abdomen is soft.  Skin:    General: Skin is warm and dry.  Neurological:     General: No focal deficit present.     Mental Status: She is alert and oriented to person, place, and time. Mental status is at baseline.  Psychiatric:        Mood and Affect: Mood normal.        Behavior: Behavior normal.        Thought Content: Thought content normal.        Judgment: Judgment normal.      MD Evaluation Airway: WNL Heart: WNL Abdomen: WNL Chest/ Lungs: WNL ASA  Classification: 3 Mallampati/Airway Score: Two   Imaging: No results found.  Labs:  CBC: Recent Labs    11/03/22 1313 06/08/23 1306 07/24/23 1511 07/25/23 0349  WBC 8.0 13.6* 7.2 7.6  HGB 13.0 12.5 12.4 11.1*  HCT 40.7 38.5 39.1 35.2*  PLT 316 413* 359 341    COAGS: No results for input(s): "INR", "APTT" in the last 8760 hours.  BMP: Recent Labs    11/03/22 1313 06/08/23 1306 07/24/23 1511 07/25/23 0349  NA 138 136 136 138  K 4.2 3.8 3.6 3.5  CL 105 102 103 107  CO2 23 22 23  21*  GLUCOSE 107* 106* 101* 122*  BUN 15 10 11 12   CALCIUM 9.3 9.4 9.7 9.1  CREATININE 0.71 0.49 0.72 0.65  GFRNONAA >60 >60 >60 >60    LIVER FUNCTION TESTS: Recent Labs    07/24/23 1511  BILITOT 0.5  AST 20  ALT 13  ALKPHOS 69  PROT 7.6  ALBUMIN 3.6    TUMOR MARKERS: No results for input(s): "AFPTM", "CEA", "CA199", "CHROMGRNA"  in the last 8760 hours.  Assessment and Plan: L3-L4 osteomyelitis Patient with history of back pain.  Recent imaging findings consistent with discitis/osteomyelitis. Now admitted for IV abx. IR consulted for aspiration for culture.  Case reviewed and approved by Dr. Tommie Sams for facet aspiration.  She has been NPO.  She is ageeable to proceed.   Risks and benefits of aspiration was discussed with the patient and/or patient's family including, but not limited to bleeding, infection, damage to adjacent structures or low yield requiring additional tests.  All of the questions were answered and there is agreement to proceed.  Consent signed and in chart.   Thank you for this interesting consult.  I greatly enjoyed meeting Stacey Coleman and look forward to participating in their care.  A copy of this report was sent to the requesting provider on this date.  Electronically Signed: Hoyt Koch, PA 07/25/2023, 2:24 PM   I spent a total of 20 Minutes    in face to face in clinical consultation, greater than 50% of which was counseling/coordinating care for L3-L4 osteomyelitis.

## 2023-07-25 NOTE — ED Notes (Signed)
ED TO INPATIENT HANDOFF REPORT  ED Nurse Name and Phone #: Cammy Copa  S Name/Age/Gender Stacey Coleman 72 y.o. female Room/Bed: 037C/037C  Code Status   Code Status: Full Code  Home/SNF/Other Home Patient oriented to: self, place, time, and situation Is this baseline? Yes   Triage Complete: Triage complete  Chief Complaint Osteomyelitis of lumbar spine [facility]) [M46.26]  Triage Note Patient had a scan and has osteomyelitis of her spine and sent by MD for IV antibiotics   Allergies Allergies  Allergen Reactions   Grass Pollen(K-O-R-T-Swt Vern) Other (See Comments)   Shellfish Allergy Other (See Comments)    Never had, she doesn't like to eat shrimp.    Strawberry (Diagnostic) Hives    Level of Care/Admitting Diagnosis ED Disposition     ED Disposition  Admit   Condition  --   Comment  Hospital Area: [facility] [[id number]]  Level of Care: Med-Surg [16]  May admit patient to [facility] or [facility] if equivalent level of care is available:: No  Covid Evaluation: Asymptomatic - no recent exposure (last 10 days) testing not required  Diagnosis: Osteomyelitis of lumbar spine [facility]) [[id number]]  Admitting Physician: Briscoe Deutscher [[id number]]  Attending Physician: Briscoe Deutscher [[id number]]  Certification:: I certify this patient will need inpatient services for at least 2 midnights  Expected Medical Readiness: 07/27/2023          B Medical/Surgery History Past Medical History:  Diagnosis Date   Anemia 2013   after multiple blood donations   Arthritis    lower back and left ankle, Follows w/ [name], MD orthopedics.   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 Tally Joe @ [facility].   Hypothyroid    Follows w/ PCP,  Dr. Tally Joe @ [facility].   Laryngotracheal cleft    laryngotracheal reflux   Nasal congestion    nasal dripping   Osteoporosis    Follows w/ Dr. Cleophas Dunker, orthopedics.   Pre-diabetes    Follows w/ PCP. No meds.   Seasonal allergies    Sleep apnea    mild, no CPAP   Wears hearing aid    both ears   Wears partial dentures    Past Surgical History:  Procedure Laterality Date   ANKLE ARTHROSCOPY  2008   spurs-left   BLADDER SUSPENSION N/A 11/08/2022   Procedure: TRANSVAGINAL TAPE (TVT) PROCEDURE;  Surgeon: Marguerita Beards, MD;  Location: [facility];  Service: Gynecology;  Laterality: N/A;   BONE RESECTION  2004   left humerous   CLOSED REDUCTION FINGER WITH PERCUTANEOUS PINNING Right 10/30/2020   Procedure: CLOSED REDUCTION RIGHT SMALL PROXIMAL PHALANX FRACTURE OF  FINGER WITH PERCUTANEOUS PINNING;  Surgeon: Betha Loa, MD;  Location: [facility];  Service: Orthopedics;  Laterality: Right;   COLONOSCOPY     CYSTOCELE REPAIR N/A 11/08/2022   Procedure: ANTERIOR REPAIR (CYSTOCELE);  Surgeon: Marguerita Beards, MD;  Location: [facility];  Service: Gynecology;  Laterality: N/A;   CYSTOSCOPY N/A 11/08/2022   Procedure: CYSTOSCOPY;  Surgeon: Marguerita Beards, MD;  Location: [facility];  Service: Gynecology;  Laterality: N/A;   ESOPHAGOGASTRODUODENOSCOPY     hiatal hernia  KNEE ARTHROSCOPY  1990   rt   KNEE ARTHROSCOPY  2002   rt   KNEE ARTHROSCOPY W/ ACL RECONSTRUCTION  2001   right   KNEE ARTHROSCOPY WITH MEDIAL MENISECTOMY Left 12/13/2013   Procedure: LEFT KNEE ARTHROSCOPY WITH MEDIAL MENISECTOMY, PLICA EXCISION, CHONDROPLASTY;  Surgeon: Loreta Ave, MD;  Location: [facility];  Service: Orthopedics;  Laterality: Left;   REVERSE SHOULDER ARTHROPLASTY Right 06/09/2023   Procedure: REVERSE SHOULDER ARTHROPLASTY;  Surgeon: Francena Hanly, MD;  Location: [facility];  Service: Orthopedics;   Laterality: Right;  interscalene block 110   SHOULDER SURGERY Left 2004   Removed chondro sarcoma   TOTAL KNEE ARTHROPLASTY Right 08/28/2014   Procedure: RIGHT TOTAL KNEE ARTHROPLASTY;  Surgeon: Loreta Ave, MD;  Location: [facility];  Service: Orthopedics;  Laterality: Right;   VAGINAL HYSTERECTOMY N/A 11/08/2022   Procedure: HYSTERECTOMY VAGINAL with possible bilateral salpingo-oophorectomy;  Surgeon: Marguerita Beards, MD;  Location: [facility];  Service: Gynecology;  Laterality: N/A;   VAGINAL PROLAPSE REPAIR N/A 11/08/2022   Procedure: VAGINAL VAULT SUSPENSION;  Surgeon: Marguerita Beards, MD;  Location: [facility];  Service: Gynecology;  Laterality: N/A;     A IV Location/Drains/Wounds Patient Lines/Drains/Airways Status     Active Line/Drains/Airways     Name Placement date Placement time Site Days   Peripheral IV 06/09/23 18 G 1.16" Left;Posterior Wrist 06/09/23  1105  Wrist  46   Peripheral IV 07/24/23 22 G 1" Anterior;Left Forearm 07/24/23  2003  Forearm  1            Intake/Output Last 24 hours No intake or output data in the 24 hours ending 07/25/23 1100  Labs/Imaging Results for orders placed or performed during the hospital encounter of 07/24/23 (from the past 48 hours)  CBC with Differential     Status: None   Collection Time: 07/24/23  3:11 PM  Result Value Ref Range   WBC 7.2 4.0 - 10.5 K/uL   RBC 4.39 3.87 - 5.11 MIL/uL   Hemoglobin 12.4 12.0 - 15.0 g/dL   HCT 16.1 09.6 - 04.5 %   MCV 89.1 80.0 - 100.0 fL   MCH 28.2 26.0 - 34.0 pg   MCHC 31.7 30.0 - 36.0 g/dL   RDW 40.9 81.1 - 91.4 %   Platelets 359 150 - 400 K/uL   nRBC 0.0 0.0 - 0.2 %   Neutrophils Relative % 56 %   Neutro Abs 4.1 1.7 - 7.7 K/uL   Lymphocytes Relative 31 %   Lymphs Abs 2.2 0.7 - 4.0 K/uL   Monocytes Relative 9 %   Monocytes Absolute 0.7 0.1 - 1.0 K/uL   Eosinophils Relative 2 %   Eosinophils Absolute 0.2 0.0 - 0.5 K/uL   Basophils Relative  1 %   Basophils Absolute 0.0 0.0 - 0.1 K/uL   Immature Granulocytes 1 %   Abs Immature Granulocytes 0.05 0.00 - 0.07 K/uL    Comment: Performed at [facility], [address], [place], [place] [address]  Comprehensive metabolic panel     Status: Abnormal   Collection Time: 07/24/23  3:11 PM  Result Value Ref Range   Sodium 136 135 - 145 mmol/L   Potassium 3.6 3.5 - 5.1 mmol/L   Chloride 103 98 - 111 mmol/L   CO2 23 22 - 32 mmol/L   Glucose, Bld 101 (H) 70 - 99 mg/dL    Comment: Glucose reference range applies only to  samples taken after fasting for at least 8 hours.   BUN 11 8 - 23 mg/dL   Creatinine, Ser 1.61 0.44 - 1.00 mg/dL   Calcium 9.7 8.9 - 09.6 mg/dL   Total Protein 7.6 6.5 - 8.1 g/dL   Albumin 3.6 3.5 - 5.0 g/dL   AST 20 15 - 41 U/L   ALT 13 0 - 44 U/L   Alkaline Phosphatase 69 38 - 126 U/L   Total Bilirubin 0.5 <1.2 mg/dL   GFR, Estimated >04 >54 mL/min    Comment: (NOTE) Calculated using the CKD-EPI Creatinine Equation (2021)    Anion gap 10 5 - 15    Comment: Performed at [facility], [address], [place], [place] [address]  Lactic acid, plasma     Status: None   Collection Time: 07/24/23  3:11 PM  Result Value Ref Range   Lactic Acid, Venous 0.9 0.5 - 1.9 mmol/L    Comment: Performed at [facility], [address], [place], [place] [address]  Sedimentation rate     Status: None   Collection Time: 07/24/23  3:11 PM  Result Value Ref Range   Sed Rate 14 0 - 22 mm/hr    Comment: Performed at [facility], [address], [place], [place] [address]  I-Stat CG4 Lactic Acid     Status: None   Collection Time: 07/24/23  6:45 PM  Result Value Ref Range   Lactic Acid, Venous 1.3 0.5 - 1.9 mmol/L  C-reactive protein     Status: Abnormal   Collection Time: 07/25/23  3:49 AM  Result Value Ref Range   CRP 1.8 (H) <1.0 mg/dL    Comment: Performed at [facility], [address] [place], [place] [address]  Basic metabolic panel      Status: Abnormal   Collection Time: 07/25/23  3:49 AM  Result Value Ref Range   Sodium 138 135 - 145 mmol/L   Potassium 3.5 3.5 - 5.1 mmol/L   Chloride 107 98 - 111 mmol/L   CO2 21 (L) 22 - 32 mmol/L   Glucose, Bld 122 (H) 70 - 99 mg/dL    Comment: Glucose reference range applies only to samples taken after fasting for at least 8 hours.   BUN 12 8 - 23 mg/dL   Creatinine, Ser 5.78 0.44 - 1.00 mg/dL   Calcium 9.1 8.9 - 46.9 mg/dL   GFR, Estimated >62 >95 mL/min    Comment: (NOTE) Calculated using the CKD-EPI Creatinine Equation (2021)    Anion gap 10 5 - 15    Comment: Performed at [facility], [address] [place], [place] [address]  CBC     Status: Abnormal   Collection Time: 07/25/23  3:49 AM  Result Value Ref Range   WBC 7.6 4.0 - 10.5 K/uL   RBC 3.96 3.87 - 5.11 MIL/uL   Hemoglobin 11.1 (L) 12.0 - 15.0 g/dL   HCT 24.4 (L) 01.0 - 27.2 %   MCV 88.9 80.0 - 100.0 fL   MCH 28.0 26.0 - 34.0 pg   MCHC 31.5 30.0 - 36.0 g/dL   RDW 53.6 64.4 - 03.4 %   Platelets 341 150 - 400 K/uL   nRBC 0.0 0.0 - 0.2 %    Comment: Performed at [facility], [address] [place], [place] [address]  CBG monitoring, ED     Status: None   Collection Time: 07/25/23  4:15 AM  Result Value Ref Range   Glucose-Capillary 82 70 - 99 mg/dL    Comment: Glucose reference range applies only to samples taken after fasting for at least 8 hours.   No results found.  Pending Labs [employer] (From admission, onward)     Start     Ordered   07/25/23 0947  Protime-INR  Once,   R        07/25/23 0946   07/25/23 0500  Basic metabolic panel  Daily,   R      07/24/23 1934   07/25/23 0500  CBC  Daily,   R      07/24/23 1934   07/24/23 1440  Blood culture (routine x 2)  BLOOD CULTURE X 2,   R (with STAT occurrences)      07/24/23 1439            Vitals/Pain Today's Vitals   07/24/23 2034 07/24/23 2218 07/25/23 0335 07/25/23 0821  BP:  137/83 124/62 127/76  Pulse:  82 91 81   Resp:  16 16 16   Temp: 98.3 F (36.8 C) 98.3 F (36.8 C) 98.2 F (36.8 C) 98.7 F (37.1 C)  TempSrc: Oral Oral Oral   SpO2:  100% 98% 97%  Weight:      Height:      PainSc:  2  2      Isolation Precautions No active isolations  Medications Medications  acetaminophen (TYLENOL) tablet 650 mg (has no administration in time range)    Or  acetaminophen (TYLENOL) suppository 650 mg (has no administration in time range)  oxyCODONE (Oxy IR/ROXICODONE) immediate release tablet 2.5 mg (has no administration in time range)  senna-docusate (Senokot-S) tablet 1 tablet (has no administration in time range)  methocarbamol (ROBAXIN) injection 500 mg (has no administration in time range)  amLODipine (NORVASC) tablet 5 mg (5 mg Oral Given 07/25/23 1023)  rosuvastatin (CRESTOR) tablet 5 mg (5 mg Oral Given 07/25/23 1024)  levothyroxine (SYNTHROID) tablet 100 mcg (100 mcg Oral Given 07/25/23 0641)  pantoprazole (PROTONIX) EC tablet 40 mg (40 mg Oral Given 07/25/23 1024)  benazepril (LOTENSIN) tablet 20 mg (20 mg Oral Given 07/25/23 1023)    And  hydrochlorothiazide (HYDRODIURIL) tablet 12.5 mg (12.5 mg Oral Given 07/25/23 1023)    Mobility walks     Focused Assessments Severe lower back pain   R Recommendations: See Admitting Provider Note  Report given to:   Additional Notes: NPO for IR today. Probably around lunch time, per IR tech. R arm sling from previous fx.

## 2023-07-25 NOTE — ED Notes (Signed)
Called IR and the tech states she thinks this pt will be taken over around noon.

## 2023-07-25 NOTE — Progress Notes (Signed)
Brief ID Note -   ID Consult received for concern over discitis / osteomyelitis for Stacey Coleman seen on outpatient MRI. She was seen as a new patient with Stacey Coleman Ortho team for low back pain with radicular symptoms. MRI revealed edematous changes in left posterior aspect of L4 vertebral body with surrounding fluid c/w OM. Upon presentation to the hospital she reported that her pain was actually improved.   She is currently OTF in IR for disc aspiration to help identify micro target for this infection.   She has low inflammatory markers (CRP 1.8, ESR 14) and no infectious symptoms described in chart. She had recent referse total shoulder d/t fracture with Dr. Rennis Coleman on 06/09/23.   After aspiration is complete will start empiric daptomycin + ceftriaxone for treatment and see her formally tomorrow 12/31.     Stacey Alberts, MSN, NP-C [facility]  [email or web address] Pager: [phone] Office: [phone] [facility] Main Line: [phone] *Secure Chat Communication Welcome

## 2023-07-25 NOTE — ED Notes (Signed)
Pt wanted it noted that she wasn't refusing to take her blood pressure medication. She normally takes them in the morning and wanted to stay on her home schedule as much as possible.

## 2023-07-25 NOTE — Consult Note (Signed)
Providing Compassionate, Quality Care - Together   Reason for Consult: L3-4 osteomyelitis/discitis Referring Physician: Dr. [name] is an 72 y.o. female.  HPI: Stacey Coleman is a 72 year old female with history outlined below. She saw Dr. Cleophas Dunker of [facility] in the outpatient setting for back pain. An MRI was ordered by Dr. Cleophas Dunker and was performed on 07/07/2023. It revealed changes consistent with osteomyelitis/discitis at L3-4. The patient reports back pain that has improved since she was seen at [facility]. She is no longer having tingling in her right lower extremity. She denies fever or feelings of malaise. She denies weakness of her lower extremities. CRP 1.8 ESR 14  Past Medical History:  Diagnosis Date   Anemia 2013   after multiple blood donations   Arthritis    lower back and left ankle, Follows w/ [name], MD orthopedics.   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 Tally Joe @ [facility].   Hypothyroid    Follows w/ PCP, Dr. Tally Joe @ [place].   Laryngotracheal cleft    laryngotracheal reflux   Nasal congestion    nasal dripping   Osteoporosis    Follows w/ Dr. Cleophas Dunker, orthopedics.   Pre-diabetes    Follows w/ PCP. No meds.   Seasonal allergies    Sleep apnea    mild, no CPAP   Wears hearing aid    both ears   Wears partial dentures     Past Surgical History:  Procedure Laterality Date   ANKLE ARTHROSCOPY  2008   spurs-left   BLADDER SUSPENSION N/A 11/08/2022   Procedure: TRANSVAGINAL TAPE (TVT) PROCEDURE;  Surgeon: Marguerita Beards, MD;  Location: [facility];  Service: Gynecology;  Laterality: N/A;   BONE RESECTION  2004   left humerous   CLOSED REDUCTION FINGER WITH PERCUTANEOUS  PINNING Right 10/30/2020   Procedure: CLOSED REDUCTION RIGHT SMALL PROXIMAL PHALANX FRACTURE OF  FINGER WITH PERCUTANEOUS PINNING;  Surgeon: Betha Loa, MD;  Location: [facility];  Service: Orthopedics;  Laterality: Right;   COLONOSCOPY     CYSTOCELE REPAIR N/A 11/08/2022   Procedure: ANTERIOR REPAIR (CYSTOCELE);  Surgeon: Marguerita Beards, MD;  Location: [facility];  Service: Gynecology;  Laterality: N/A;   CYSTOSCOPY N/A 11/08/2022   Procedure: CYSTOSCOPY;  Surgeon: Marguerita Beards, MD;  Location: [facility];  Service: Gynecology;  Laterality: N/A;   ESOPHAGOGASTRODUODENOSCOPY     hiatal hernia   KNEE ARTHROSCOPY  1990   rt   KNEE ARTHROSCOPY  2002   rt   KNEE ARTHROSCOPY W/ ACL RECONSTRUCTION  2001   right   KNEE ARTHROSCOPY WITH MEDIAL MENISECTOMY Left 12/13/2013   Procedure: LEFT KNEE ARTHROSCOPY WITH MEDIAL MENISECTOMY, PLICA EXCISION, CHONDROPLASTY;  Surgeon: Loreta Ave, MD;  Location: [facility];  Service: Orthopedics;  Laterality: Left;   REVERSE SHOULDER ARTHROPLASTY Right 06/09/2023   Procedure: REVERSE SHOULDER ARTHROPLASTY;  Surgeon: Francena Hanly, MD;  Location: [facility];  Service: Orthopedics;  Laterality: Right;  interscalene block 110   SHOULDER SURGERY Left 2004   Removed chondro sarcoma   TOTAL KNEE ARTHROPLASTY Right 08/28/2014   Procedure: RIGHT TOTAL KNEE ARTHROPLASTY;  Surgeon: Loreta Ave, MD;  Location: [facility];  Service: Orthopedics;  Laterality: Right;   VAGINAL HYSTERECTOMY N/A 11/08/2022   Procedure: HYSTERECTOMY VAGINAL with possible bilateral salpingo-oophorectomy;  Surgeon: Marguerita Beards, MD;  Location: [facility];  Service: Gynecology;  Laterality: N/A;   VAGINAL PROLAPSE REPAIR N/A 11/08/2022   Procedure: VAGINAL VAULT SUSPENSION;  Surgeon: Marguerita Beards, MD;  Location: [facility];  Service: Gynecology;  Laterality: N/A;    Family  History  Problem Relation Age of Onset   Breast cancer Mother    Heart attack Father    Hypertension Father    Diabetes Brother    Heart attack Maternal Grandfather    Colon cancer Paternal Grandfather    Diabetes Other     Social History:  reports that she has never smoked. She has never used smokeless tobacco. She reports current alcohol use. She reports that she does not use drugs.  Allergies:  Allergies  Allergen Reactions   Grass Pollen(K-O-R-T-Swt Vern) Other (See Comments)   Shellfish Allergy Other (See Comments)    Never had, she doesn't like to eat shrimp.    Strawberry (Diagnostic) Hives    Medications: I have reviewed the patient's current medications.  Results for orders placed or performed during the hospital encounter of 07/24/23 (from the past 48 hours)  CBC with Differential     Status: None   Collection Time: 07/24/23  3:11 PM  Result Value Ref Range   WBC 7.2 4.0 - 10.5 K/uL   RBC 4.39 3.87 - 5.11 MIL/uL   Hemoglobin 12.4 12.0 - 15.0 g/dL   HCT 62.3 76.2 - 83.1 %   MCV 89.1 80.0 - 100.0 fL   MCH 28.2 26.0 - 34.0 pg   MCHC 31.7 30.0 - 36.0 g/dL   RDW 51.7 61.6 - 07.3 %   Platelets 359 150 - 400 K/uL   nRBC 0.0 0.0 - 0.2 %   Neutrophils Relative % 56 %   Neutro Abs 4.1 1.7 - 7.7 K/uL   Lymphocytes Relative 31 %   Lymphs Abs 2.2 0.7 - 4.0 K/uL   Monocytes Relative 9 %   Monocytes Absolute 0.7 0.1 - 1.0 K/uL   Eosinophils Relative 2 %   Eosinophils Absolute 0.2 0.0 - 0.5 K/uL   Basophils Relative 1 %   Basophils Absolute 0.0 0.0 - 0.1 K/uL   Immature Granulocytes 1 %   Abs Immature Granulocytes 0.05 0.00 - 0.07 K/uL    Comment: Performed at [facility], [address], [place], [place] [address]  Comprehensive metabolic panel     Status: Abnormal   Collection Time: 07/24/23  3:11 PM  Result Value Ref Range   Sodium 136 135 - 145 mmol/L   Potassium 3.6 3.5 - 5.1 mmol/L   Chloride 103 98 - 111 mmol/L   CO2 23 22 - 32 mmol/L   Glucose, Bld  101 (H) 70 - 99 mg/dL    Comment: Glucose reference range applies only to samples taken after fasting for at least 8 hours.   BUN 11 8 - 23 mg/dL   Creatinine, Ser 6.94 0.44 - 1.00 mg/dL   Calcium 9.7 8.9 - 85.4 mg/dL   Total Protein 7.6 6.5 - 8.1 g/dL   Albumin 3.6 3.5 - 5.0 g/dL   AST 20 15 - 41 U/L   ALT 13 0 - 44 U/L   Alkaline Phosphatase 69 38 - 126 U/L   Total Bilirubin 0.5 <  1.2 mg/dL   GFR, Estimated >30 >86 mL/min    Comment: (NOTE) Calculated using the CKD-EPI Creatinine Equation (2021)    Anion gap 10 5 - 15    Comment: Performed at [facility], [address], [place], [place] [address]  Lactic acid, plasma     Status: None   Collection Time: 07/24/23  3:11 PM  Result Value Ref Range   Lactic Acid, Venous 0.9 0.5 - 1.9 mmol/L    Comment: Performed at [facility], [address] [place], [place] [address]  Sedimentation rate     Status: None   Collection Time: 07/24/23  3:11 PM  Result Value Ref Range   Sed Rate 14 0 - 22 mm/hr    Comment: Performed at [facility], [address] [place], [place] [address]  I-Stat CG4 Lactic Acid     Status: None   Collection Time: 07/24/23  6:45 PM  Result Value Ref Range   Lactic Acid, Venous 1.3 0.5 - 1.9 mmol/L  C-reactive protein     Status: Abnormal   Collection Time: 07/25/23  3:49 AM  Result Value Ref Range   CRP 1.8 (H) <1.0 mg/dL    Comment: Performed at [facility], [address] [place], [place] [address]  Basic metabolic panel     Status: Abnormal   Collection Time: 07/25/23  3:49 AM  Result Value Ref Range   Sodium 138 135 - 145 mmol/L   Potassium 3.5 3.5 - 5.1 mmol/L   Chloride 107 98 - 111 mmol/L   CO2 21 (L) 22 - 32 mmol/L   Glucose, Bld 122 (H) 70 - 99 mg/dL    Comment: Glucose reference range applies only to samples taken after fasting for at least 8 hours.   BUN 12 8 - 23 mg/dL   Creatinine, Ser 0.27 0.44 - 1.00 mg/dL   Calcium 9.1 8.9 - 25.3 mg/dL   GFR, Estimated >66 >44  mL/min    Comment: (NOTE) Calculated using the CKD-EPI Creatinine Equation (2021)    Anion gap 10 5 - 15    Comment: Performed at [facility], [address] [place], [place] [address]  CBC     Status: Abnormal   Collection Time: 07/25/23  3:49 AM  Result Value Ref Range   WBC 7.6 4.0 - 10.5 K/uL   RBC 3.96 3.87 - 5.11 MIL/uL   Hemoglobin 11.1 (L) 12.0 - 15.0 g/dL   HCT 25.9 (L) 56.3 - 87.5 %   MCV 88.9 80.0 - 100.0 fL   MCH 28.0 26.0 - 34.0 pg   MCHC 31.5 30.0 - 36.0 g/dL   RDW 64.3 32.9 - 51.8 %   Platelets 341 150 - 400 K/uL   nRBC 0.0 0.0 - 0.2 %    Comment: Performed at [facility], [address], [place], [place] [address]  CBG monitoring, ED     Status: None   Collection Time: 07/25/23  4:15 AM  Result Value Ref Range   Glucose-Capillary 82 70 - 99 mg/dL    Comment: Glucose reference range applies only to samples taken after fasting for at least 8 hours.    CLINICAL DATA: Abnormal MRI of lumbar spine. Low back pain.  EXAM: MRI LUMBAR SPINE WITHOUT AND WITH CONTRAST  TECHNIQUE: Multiplanar and multiecho pulse sequences of the lumbar spine were obtained without and with intravenous contrast.  CONTRAST: 13 mL MultiHance  COMPARISON: MR lumbar  spine without contrast 05/17/2023  FINDINGS: Segmentation: 5 non rib-bearing lumbar type vertebral bodies are present. The lowest fully formed vertebral body is L5.  Alignment: No significant listhesis scratched at slight retrolisthesis at L1-2 L2-3 and L3-4 is stable. Minimal anterolisthesis at L5-S1 is stable.  Vertebrae: Chronic type 2 Modic changes are again noted at L4-5. Edematous changes involving the left posterosuperior aspect of the L4 vertebral body and extending into the left pedicle have increased since the prior exam. T2 signal changes evident on the STIR sequence along the inferior aspect of the L3 vertebral body are present on the left. Minimal fluid and enhancement is also present in the left  side of the disc. The findings are consistent with disc osteomyelitis. No solid lesion is present.  Conus medullaris and cauda equina: Conus extends to the L1 level. Conus and cauda equina appear normal.  Paraspinal and other soft tissues: Limited imaging the abdomen is unremarkable. There is no significant adenopathy. A simple cyst in the left kidney measures 16 mm. No follow-up imaging is recommended.  Disc levels:  T12-L1: Mild disc bulging and facet hypertrophy is present without significant stenosis.  L1-2: Mild disc bulging and facet hypertrophy is present. No significant stenosis or change present.  L2-3: A broad-based disc protrusion is present. Moderate facet hypertrophy is noted bilaterally. The central canal is patent. Mild foraminal narrowing is present bilaterally.  L3-4: A broad-based disc protrusion is present. Moderate facet hypertrophy and ligamentum flavum thickening is present. Moderate central canal stenosis is present. Moderate to severe foraminal narrowing is present bilaterally.  L4-5: L5-S1: A broad-based disc protrusion is present. Facet hypertrophy is present. Moderate subarticular narrowing is worse on the right. Severe right and moderate left foraminal stenosis is stable.  L5-S1: A mild broad-based disc protrusion extends into the foramina bilaterally. Moderate facet hypertrophy is present bilaterally. Mild subarticular and foraminal stenosis is stable bilaterally.  IMPRESSION: 1. Edematous changes involving the left posterosuperior aspect of the L4 vertebral body and extending into the left pedicle have increased since the prior exam. Additionally, T2 signal changes along the inferior aspect of the L3 vertebral body on the left as well as minimal fluid and enhancement in the left side of the disc. 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 2. Moderate central canal stenosis and moderate to severe foraminal narrowing bilaterally at L3-4. 3.  Moderate subarticular narrowing at L4-5 is worse on the right. 4. Severe right and moderate left foraminal stenosis at L4-5 is stable. 5. Mild subarticular and foraminal stenosis bilaterally at L5-S1 is stable. 6. Mild foraminal narrowing bilaterally at L2-3 is stable.  These results will be called to the ordering clinician or representative by the Radiologist Assistant, and communication documented in the PACS or [employer].   Electronically Signed By: Marin Roberts M.D. On: 07/20/2023 12:36  Review of Systems  Constitutional: Negative.   HENT: Negative.    Eyes: Negative.   Respiratory: Negative.    Cardiovascular: Negative.   Gastrointestinal: Negative.   Genitourinary: Negative.   Musculoskeletal:  Positive for back pain.  Skin: Negative.   Neurological: Negative.   Endo/Heme/Allergies: Negative.   Psychiatric/Behavioral: Negative.     Blood pressure 127/76, pulse 81, temperature 98.7 F (37.1 C), resp. rate 16, height 5' (1.524 m), weight 64.4 kg, SpO2 97%. Estimated body mass index is 27.73 kg/m as calculated from the following:   Height as of this encounter: 5' (1.524 m).   Weight as of this encounter: 64.4 kg.  Physical Exam Constitutional:  Appearance: Normal appearance.  HENT:     Head: Normocephalic and atraumatic.     Nose: Nose normal.     Mouth/Throat:     Mouth: Mucous membranes are moist.     Pharynx: Oropharynx is clear.  Eyes:     Extraocular Movements: Extraocular movements intact.     Pupils: Pupils are equal, round, and reactive to light.  Cardiovascular:     Rate and Rhythm: Normal rate and regular rhythm.     Pulses: Normal pulses.  Pulmonary:     Effort: Pulmonary effort is normal. No respiratory distress.  Abdominal:     General: Abdomen is flat.     Palpations: Abdomen is soft.  Musculoskeletal:        General: Normal range of motion.     Cervical back: Normal range of motion and neck supple.  Skin:    General: Skin  is warm and dry.     Capillary Refill: Capillary refill takes less than 2 seconds.  Neurological:     General: No focal deficit present.     Mental Status: She is alert. Mental status is at baseline.  Psychiatric:        Mood and Affect: Mood normal.        Behavior: Behavior normal.        Thought Content: Thought content normal.        Judgment: Judgment normal.     Assessment/Plan: Stacey Coleman appears to have developed spontaneous osteomyelitis/discitis at L3-4. Recommend aspiration at this level by Interventional Radiology to obtain sample for culture. Can start empiric antibiotics following aspiration. There is no role for surgery at this time. Please re-consult Neurosurgery if we can be of further assistance.  I am in communication with my attending and they agree with the plan for this patient.   Val Eagle, DNP, AGNP-C Nurse Practitioner  [facility] [address], [address], [place], [place] [address] P: [phone]    F: [phone]  07/25/2023, 9:50 AM

## 2023-07-26 ENCOUNTER — Other Ambulatory Visit: Payer: Self-pay

## 2023-07-26 DIAGNOSIS — M4626 Osteomyelitis of vertebra, lumbar region: Secondary | ICD-10-CM | POA: Diagnosis not present

## 2023-07-26 LAB — CBC
HCT: 34.4 % — ABNORMAL LOW (ref 36.0–46.0)
Hemoglobin: 11.2 g/dL — ABNORMAL LOW (ref 12.0–15.0)
MCH: 28.4 pg (ref 26.0–34.0)
MCHC: 32.6 g/dL (ref 30.0–36.0)
MCV: 87.3 fL (ref 80.0–100.0)
Platelets: 327 10*3/uL (ref 150–400)
RBC: 3.94 MIL/uL (ref 3.87–5.11)
RDW: 14.2 % (ref 11.5–15.5)
WBC: 9.2 10*3/uL (ref 4.0–10.5)
nRBC: 0 % (ref 0.0–0.2)

## 2023-07-26 LAB — CSF CELL COUNT WITH DIFFERENTIAL
RBC Count, CSF: 7250 /mm3 — ABNORMAL HIGH
Supernatant: UNDETERMINED
WBC, CSF: 6 /mm3 — ABNORMAL HIGH (ref 0–5)

## 2023-07-26 LAB — BASIC METABOLIC PANEL
Anion gap: 9 (ref 5–15)
BUN: 15 mg/dL (ref 8–23)
CO2: 22 mmol/L (ref 22–32)
Calcium: 9.2 mg/dL (ref 8.9–10.3)
Chloride: 103 mmol/L (ref 98–111)
Creatinine, Ser: 0.63 mg/dL (ref 0.44–1.00)
GFR, Estimated: >60 mL/min (ref >60)
Glucose, Bld: 117 mg/dL — ABNORMAL HIGH (ref 70–99)
Potassium: 3.7 mmol/L (ref 3.5–5.1)
Sodium: 134 mmol/L — ABNORMAL LOW (ref 135–145)

## 2023-07-26 NOTE — Consult Note (Signed)
 [facility]    Date of Admission:  07/24/2023     Total days of antibiotics 1  Daptomycin   Ceftriaxone               Reason for Consult: vertebral infection L3-4 on serial MRI     Referring Provider: Jillian  Primary Care Provider: Seabron Lenis, MD   Assessment: Stacey Coleman is a 72 y.o. female admitted from home in stable condition with:   Abnormal Serial MRIs -  Concern over L3-4 discitis / Osteomyelitis -  Chronic Arthritis with LBP -  Stacey Coleman has had interval changes from October MRI to most recent MRI in December that are concerning for infection contributing to her increased back pain since over the summer.   While her inflammatory markers are flat and she has not had any conventional infectious symptoms, the acute changes in serial images are of concern for infection and would recommend to treat as such.  -Follow up micro from disc aspiration and adjust abx accordingly -For now continue IV daptomycin  + ceftriaxone  daily   Vascular Access -  -PICC ordered -Home health orders will be placed after allowing cultures to incubate preliminarily.  -Advised she will need to hold off on water  aerobics while PICC in place  Discharge Planning / Coordination of Care -  -Outpatient antibiotics pending  -Discussed with TOC team and planning on D/C home tomorrow  -She lives alone but feels that she could do her antibiotic administration with teaching support from home health and close neighbors around her  Medication Monitoring -  -Safety labs ordered and detailed below to be followed in OPAT clinic -CK baseline in AM     Plan: Continue Daptomycin  and ceftriaxone  IV  Follow micro for any updates and adjusments as indicated OK to place PICC now (ordered)  Hopefully to get her home 1/1     Principal Problem:   Osteomyelitis of lumbar spine (HCC) Active Problems:   Essential hypertension   Hyperlipidemia   Hypothyroid     amLODipine   5 mg Oral Daily   benazepril   20 mg Oral Daily   And   hydrochlorothiazide   12.5 mg Oral Daily   levothyroxine   100 mcg Oral Q0600   lidocaine  (PF)  30 mL Intradermal Once   pantoprazole   40 mg Oral Daily   rosuvastatin   5 mg Oral Daily    HPI: Stacey Coleman is a 72 y.o. female admitted from home for concern over vertebral infection on outpatient MRIs.   Stacey Coleman tells us  she has been working with Dr. Orpha with orthopedic team for a few months now. Back pain started probably over the summer which she did not feel worth addressing until pain worsened around September prompting her appointment. She has always had arthritis related back discomfort for which she has had 2 previous injections for (last in 2020). She has otherwise been using water  aerobics to help with exercise and pain relief which she finds very helpful.   She had an MRI in October which showed some undifferentiated abnormalities around the lower spine - recommended to repeat MRI in a month. This was delayed due to shoulder surgery with [facility] following a fall and fracture as well as travel out of state with her family over holidays. Repeat MRI was obtained about 3 weeks ago and revealed concern for worsened fluid and signal abnormalities around L3/L4 concerning for infection.   She has not had  any infectious symptoms. Maybe one moment a few weeks back when she felt like she may have had a chill but nothing that has persisted. NO mechanism of injury to explain the back pain that has worsened.  She actually today reports the back pain has improved a bit since initial appt at Ortho office.  She is not having any lower extremity weakness, though has had a fall or two at home she mentions to us  in history. She does have a history of prediabetes, HTN, arthritis in the spine, chondrosarcoma Lt humerous 2004, osteoporosis.   Dr. Mavis has reviewed her scans and no surgery is necessary at this time.  She underwent IR  guided aspiration after antibiotics were held 12/30. Cell count showed no wbcs. Nothing on gram stain and no growth on cultures w/in 24h.   She has been started on IV daptomycin  and Ceftriaxone  in the interim with no side effects noted thusfar.    Review of Systems: Review of Systems  Constitutional:  Negative for chills, diaphoresis, fever, malaise/fatigue and weight loss.  Respiratory: Negative.    Cardiovascular: Negative.   Gastrointestinal: Negative.   Genitourinary: Negative.   Musculoskeletal:  Positive for back pain and falls. Negative for neck pain.  Skin:  Negative for rash.       Well healed incision over anterior right shoulder     Past Medical History:  Diagnosis Date   Anemia 2013   after multiple blood donations   Arthritis    lower back and left ankle, Follows w/ Stacey Sinner, MD orthopedics.   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 Alm Coleman @ [facility].   Hypothyroid    Follows w/ PCP, Dr. Alm Coleman @ [facility].   Laryngotracheal cleft    laryngotracheal reflux   Nasal congestion    nasal dripping   Osteoporosis    Follows w/ Dr. Sinner, orthopedics.   Pre-diabetes    Follows w/ PCP. No meds.   Seasonal allergies    Sleep apnea    mild, no CPAP   Wears hearing aid    both ears   Wears partial dentures     Social History   Tobacco Use   Smoking status: Never   Smokeless tobacco: Never  Vaping Use   Vaping status: Never Used  Substance Use Topics   Alcohol  use: Yes    Comment: occasional   Drug use: No    Family History  Problem Relation Age of Onset   Breast cancer Mother    Heart attack Father    Hypertension Father    Diabetes Brother    Heart attack Maternal Grandfather    Colon cancer Paternal  Grandfather    Diabetes Other    Allergies  Allergen Reactions   Grass Pollen(K-O-R-T-Swt Vern) Other (See Comments)   Shellfish Allergy Other (See Comments)    Never had, she doesn't like to eat shrimp.    Strawberry (Diagnostic) Hives    OBJECTIVE: Blood pressure 123/66, pulse 100, temperature 98.8 F (37.1 C), resp. rate 18, height 5' (1.524 m), weight 64.4 kg, SpO2 96%.  Physical Exam Vitals reviewed.  HENT:     Mouth/Throat:     Mouth: No oral lesions.  Dentition: No dental abscesses.  Cardiovascular:     Rate and Rhythm: Normal rate and regular rhythm.     Heart sounds: Normal heart sounds.  Pulmonary:     Effort: Pulmonary effort is normal.     Breath sounds: Normal breath sounds.  Abdominal:     General: There is no distension.     Palpations: Abdomen is soft.     Tenderness: There is no abdominal tenderness.  Musculoskeletal:        General: No tenderness. Normal range of motion.  Lymphadenopathy:     Cervical: No cervical adenopathy.  Skin:    General: Skin is warm and dry.     Findings: No rash.  Neurological:     Mental Status: She is alert and oriented to person, place, and time.  Psychiatric:        Judgment: Judgment normal.     Lab Results Lab Results  Component Value Date   WBC 9.2 07/26/2023   HGB 11.2 (L) 07/26/2023   HCT 34.4 (L) 07/26/2023   MCV 87.3 07/26/2023   PLT 327 07/26/2023    Lab Results  Component Value Date   CREATININE 0.63 07/26/2023   BUN 15 07/26/2023   NA 134 (L) 07/26/2023   K 3.7 07/26/2023   CL 103 07/26/2023   CO2 22 07/26/2023    Lab Results  Component Value Date   ALT 13 07/24/2023   AST 20 07/24/2023   ALKPHOS 69 07/24/2023   BILITOT 0.5 07/24/2023     Microbiology: Recent Results (from the past 240 hours)  Blood culture (routine x 2)     Status: None (Preliminary result)   Collection Time: 07/24/23  3:11 PM   Specimen: BLOOD LEFT ARM  Result Value Ref Range Status   Specimen Description BLOOD  LEFT ARM  Final   Special Requests   Final    BOTTLES DRAWN AEROBIC AND ANAEROBIC Blood Culture results may not be optimal due to an inadequate volume of blood received in culture bottles   Culture   Final    NO GROWTH 2 DAYS Performed at [facility], [address] [place], [place] [address]    Report Status PENDING  Incomplete  Blood culture (routine x 2)     Status: None (Preliminary result)   Collection Time: 07/24/23  6:44 PM   Specimen: BLOOD LEFT ARM  Result Value Ref Range Status   Specimen Description BLOOD LEFT ARM  Final   Special Requests   Final    BOTTLES DRAWN AEROBIC AND ANAEROBIC Blood Culture results may not be optimal due to an inadequate volume of blood received in culture bottles   Culture   Final    NO GROWTH 2 DAYS Performed at [facility], [address] [place], [place] [address]    Report Status PENDING  Incomplete  Aerobic/Anaerobic Culture w Gram Stain (surgical/deep wound)     Status: None (Preliminary result)   Collection Time: 07/25/23  3:36 PM   Specimen: Vertebra; Body Fluid  Result Value Ref Range Status   Specimen Description VERTEBRA  Final   Special Requests Normal  Final   Gram Stain NO WBC SEEN NO ORGANISMS SEEN   Final   Culture   Final    NO GROWTH < 24 HOURS Performed at [facility], [address] [place], [place] [address]    Report Status PENDING  Incomplete    Corean Fireman, MSN, NP-C [facility] Pager: [phone]  07/26/2023 12:57 PM

## 2023-07-26 NOTE — Plan of Care (Signed)
   Problem: Clinical Measurements: Goal: Will remain free from infection Outcome: Progressing   Problem: Activity: Goal: Risk for activity intolerance will decrease Outcome: Progressing   Problem: Pain Management: Goal: General experience of comfort will improve Outcome: Progressing   Problem: Safety: Goal: Ability to remain free from injury will improve Outcome: Progressing

## 2023-07-26 NOTE — Progress Notes (Signed)
 PROGRESS NOTE  Stacey Coleman  FMW:[id number] DOB: May 23, 1951 DOA: 07/24/2023 PCP: Seabron Lenis, MD   Brief Narrative: Patient is a 13 female with history of hypertension, hyperlipidemia, hypothyroidism, chronic back pain who presented with acute onset of new back pain.  Outpatient imaging concerning for possible osteomyelitis involving the lumbar spine.  No focal weakness.   Blood cultures collected.  IR consulted for CT-guided aspiration.  ID  following  Assessment & Plan:  Principal Problem:   Osteomyelitis of lumbar spine (HCC) Active Problems:   Essential hypertension   Hyperlipidemia   Hypothyroid    Osteomyelitis of lumbar spine: Presented with new onset of back pain.  No focal deficits.  No fever or leukocytosis.  Outpatient imaging was concerning for osteomyelitis of lumbar spine.  It showed edematous changes involving the left posterior superior aspect of the L4 vertebral body, findings consistent with this osteomyelitis. IR consulted for CT-guided tissue sampling.    Neurosurgery signed off, no need for surgical intervention. Blood cultures have remained negative so far.  Aspiration culture has not shown any WBC or organisms. ID closely following.  Currently on daptomycin  and ceftriaxone .  Possible plan for PICC line placement tomorrow if cultures remain negative. Currently back pain is well-controlled  Hypertension: Currently blood pressure stable.  On Norvasc , benazepril -hydrochlorothiazide  at home  Hyperlipidemia: On Crestor   Hypothyroidism: On Synthyroid       DVT prophylaxis:SCDs Start: 07/24/23 1933     Code Status: Full Code  Family Communication: None at bedside  Patient status:Inpatient  Patient is from :home  Anticipated discharge un:ynfz  Estimated DC date: Likely tomorrow, waiting for ID clearance/PICC line   Consultants: ID  Procedures:Disc aspiration  Antimicrobials:  Anti-infectives (From admission, onward)    Start     Dose/Rate  Route Frequency Ordered Stop   07/25/23 1800  cefTRIAXone  (ROCEPHIN ) 2 g in sodium chloride  0.9 % 100 mL IVPB        2 g 200 mL/hr over 30 Minutes Intravenous Every 24 hours 07/25/23 1629     07/25/23 1745  DAPTOmycin  (CUBICIN ) IVPB 500 mg/50mL premix        500 mg 100 mL/hr over 30 Minutes Intravenous Daily 07/25/23 1654     07/24/23 1500  vancomycin  (VANCOREADY) IVPB 1250 mg/250 mL  Status:  Discontinued        1,250 mg 166.7 mL/hr over 90 Minutes Intravenous  Once 07/24/23 1448 07/24/23 1910   07/24/23 1500  ceFEPIme (MAXIPIME) 2 g in sodium chloride  0.9 % 100 mL IVPB  Status:  Discontinued        2 g 200 mL/hr over 30 Minutes Intravenous  Once 07/24/23 1448 07/24/23 1910   07/24/23 1445  metroNIDAZOLE  (FLAGYL ) IVPB 500 mg  Status:  Discontinued        500 mg 100 mL/hr over 60 Minutes Intravenous  Once 07/24/23 1441 07/24/23 1910       Subjective: Patient seen and examined at bedside today.  Hemodynamically stable.  Comfortable lying in bed.  Denies any new complaints today.  Back pain well-controlled.  Objective: Vitals:   07/25/23 1623 07/25/23 1942 07/26/23 0436 07/26/23 0724  BP: 120/65 127/72 128/70 123/66  Pulse: 77 92 (!) 101 100  Resp: 17 18 18 18   Temp: 98.4 F (36.9 C) 99 F (37.2 C) 98.8 F (37.1 C)   TempSrc: Oral Oral    SpO2: 99% 96% 97% 96%  Weight:      Height:        Intake/Output Summary (  Last 24 hours) at 07/26/2023 1141 Last data filed at 07/26/2023 0631 Gross per 24 hour  Intake 270 ml  Output --  Net 270 ml   Filed Weights   07/24/23 1442  Weight: 64.4 kg    Examination:  General exam: Overall comfortable, not in distress HEENT: PERRL Respiratory system:  no wheezes or crackles  Cardiovascular system: S1 & S2 heard, RRR.  Gastrointestinal system: Abdomen is nondistended, soft and nontender. Central nervous system: Alert and oriented Extremities: No edema, no clubbing ,no cyanosis Skin: No rashes, no ulcers,no icterus      Data  Reviewed: I have personally reviewed following labs and imaging studies  CBC: Recent Labs  Lab 07/24/23 1511 07/25/23 0349 07/26/23 0606  WBC 7.2 7.6 9.2  NEUTROABS 4.1  --   --   HGB 12.4 11.1* 11.2*  HCT 39.1 35.2* 34.4*  MCV 89.1 88.9 87.3  PLT 359 341 327   Basic Metabolic Panel: Recent Labs  Lab 07/24/23 1511 07/25/23 0349 07/26/23 0606  NA 136 138 134*  K 3.6 3.5 3.7  CL 103 107 103  CO2 23 21* 22  GLUCOSE 101* 122* 117*  BUN 11 12 15   CREATININE 0.72 0.65 0.63  CALCIUM  9.7 9.1 9.2     Recent Results (from the past 240 hours)  Blood culture (routine x 2)     Status: None (Preliminary result)   Collection Time: 07/24/23  3:11 PM   Specimen: BLOOD LEFT ARM  Result Value Ref Range Status   Specimen Description BLOOD LEFT ARM  Final   Special Requests   Final    BOTTLES DRAWN AEROBIC AND ANAEROBIC Blood Culture results may not be optimal due to an inadequate volume of blood received in culture bottles   Culture   Final    NO GROWTH 2 DAYS Performed at [facility], [address], [place], [place] [address]    Report Status PENDING  Incomplete  Blood culture (routine x 2)     Status: None (Preliminary result)   Collection Time: 07/24/23  6:44 PM   Specimen: BLOOD LEFT ARM  Result Value Ref Range Status   Specimen Description BLOOD LEFT ARM  Final   Special Requests   Final    BOTTLES DRAWN AEROBIC AND ANAEROBIC Blood Culture results may not be optimal due to an inadequate volume of blood received in culture bottles   Culture   Final    NO GROWTH 2 DAYS Performed at [facility], [address] [place], [place] [address]    Report Status PENDING  Incomplete  Aerobic/Anaerobic Culture w Gram Stain (surgical/deep wound)     Status: None (Preliminary result)   Collection Time: 07/25/23  3:36 PM   Specimen: Vertebra; Body Fluid  Result Value Ref Range Status   Specimen Description VERTEBRA  Final   Special Requests Normal  Final   Gram Stain NO  WBC SEEN NO ORGANISMS SEEN   Final   Culture   Final    NO GROWTH < 24 HOURS Performed at [facility], [address], [place], [place] [address]    Report Status PENDING  Incomplete     Radiology Studies: No results found.  Scheduled Meds:  amLODipine   5 mg Oral Daily   benazepril   20 mg Oral Daily   And   hydrochlorothiazide   12.5 mg Oral Daily   levothyroxine   100 mcg Oral Q0600   lidocaine  (PF)  30 mL Intradermal Once  pantoprazole   40 mg Oral Daily   rosuvastatin   5 mg Oral Daily   Continuous Infusions:  cefTRIAXone  (ROCEPHIN )  IV Stopped (07/25/23 1802)   DAPTOmycin  Stopped (07/26/23 9379)     LOS: 2 days   Ivonne Mustache, MD [facility] P12/31/2024, 11:41 AM

## 2023-07-26 NOTE — Progress Notes (Signed)
 Transition of Care [facility]) - Inpatient Brief Assessment   Patient Details  Name: Stacey Coleman MRN: [id number] Date of Birth: 1951/03/28  [facility] CM/SW Contact:    Rosaline JONELLE Joe, RN Phone Number: 07/26/2023, 11:47 AM   Clinical Narrative: CM met with the patient at the bedside to discuss TOC needs.  The patient lives at home alone and plans to return home with needed home health services.  Patient is S/P Right shoulder surgery with Dr. Melita on 06/09/23.    No DME at the home at this time.  Patient was seen in infectious disease MD this morning and patient is planned to have PICC line placement and need for IV antibiotics for home.  I spoke with the patient at the bedside and I provided her with Medicare choice regarding home health services and she did not have a preference.    [employer] was called and Holley Herring, RNCM with [employer] plans to follow up with the patient for teaching today.  Patient will likely get PICC line placed tomorrow and possible discharge tomorrow.  I called [facility] and Cindie, CM with [facility] accepted for home health services.  Patient was planning to have therapy started at Dr. Rosea office for right arm/shoulder - however, HH may be more feasible considering patient will be providing home IV antibiotics.  I left a message with Randine Pott, PA with Dr. Rosea office to include her in the plan of care for start of therapy at home.  PT/OT evaluation pending at this time.  CM will continue to follow the patient for TOC to return home with [facility] with [facility] and [employer] for IV antibiotics - likely discharge to home tomorrow.   Transition of Care Asessment: Insurance and Status: (P) Insurance coverage has been reviewed Patient has primary care physician: (P) Yes Home environment has been reviewed: (P) From home alone Prior level of function:: (P) Independent Prior/Current Home Services: (P) No current home  services Social Drivers of Health Review: (P) SDOH reviewed interventions complete Readmission risk has been reviewed: (P) Yes Transition of care needs: (P) transition of care needs identified, TOC will continue to follow

## 2023-07-26 NOTE — Evaluation (Signed)
 Physical Therapy Evaluation Patient Details Name: Stacey Coleman MRN: [id number] DOB: 1950-08-18 Today's Date: 07/26/2023  History of Present Illness  72 y.o. female who presents to ED with back pain and outpatient imaging concerning for possible osteomyelitis involving the L4 vertebral body PICC line placement for d/c home with IV antibiotics  PMH: hypertension, hyperlipidemia, hypothyroidism, R shoulder surgery 11/14 and chronic back pain  Clinical Impression  PTA pt living alone in 2 story home with bed and bath upstairs. Pt reports independence with ambulation in home (later reports she uses furniture for support) and requires shopping cart for support with grocery shopping., independent with ADLs and iADLs (functional but might not necessarily safe.) Pt is currently limited in safe mobility by pain in R ankle and back, in presence of decreased strength, balance and endurance. Pt is mod I for bed mobility, and transfers and supervision for ambulation with reaching to sink to steady. PT recommending HHPT to work on balance and strength training. Pt hopeful for discharge home tomorrow.       If plan is discharge home, recommend the following: Assist for transportation;Assistance with cooking/housework   Can travel by private vehicle    Yes    Equipment Recommendations None recommended by PT  Recommendations for Other Services       Functional Status Assessment Patient has had a recent decline in their functional status and demonstrates the ability to make significant improvements in function in a reasonable and predictable amount of time.     Precautions / Restrictions Precautions Precautions: Fall Restrictions Weight Bearing Restrictions Per Provider Order: No      Mobility  Bed Mobility Overal bed mobility: Modified Independent             General bed mobility comments: HoB elevated, but able to get out of and back into bed without assistance    Transfers Overall  transfer level: Modified independent                 General transfer comment: good power up, uses bed rail to self steady before ambulation    Ambulation/Gait Ambulation/Gait assistance: Supervision Gait Distance (Feet): 20 Feet Assistive device: None (reaches for sink to steady) Gait Pattern/deviations: Decreased dorsiflexion - right, Decreased weight shift to right, Antalgic Gait velocity: slowed Gait velocity interpretation: <1.8 ft/sec, indicate of risk for recurrent falls   General Gait Details: supervision for ambulation to door and back, antalgic gait with decreased weightshift to RLE due to ankle pain, reaches for sink to steady as she passes by        Balance Overall balance assessment: Mild deficits observed, not formally tested                                           Pertinent Vitals/Pain Pain Assessment Pain Assessment: Faces Faces Pain Scale: Hurts even more Pain Location: low back, R ankle Pain Descriptors / Indicators: Aching, Sore, Grimacing, Guarding Pain Intervention(s): Limited activity within patient's tolerance, Monitored during session, Repositioned, Heat applied    Home Living Family/patient expects to be discharged to:: Private residence Living Arrangements: Alone Available Help at Discharge: Family;Available PRN/intermittently Type of Home: House Home Access: Stairs to enter Entrance Stairs-Rails: Can reach both Entrance Stairs-Number of Steps: 5 Alternate Level Stairs-Number of Steps: 13 Home Layout: Two level;Bed/bath upstairs Home Equipment: Rolling Walker (2 wheels);Shower seat;Cane - single point (in out building)  Prior Function Prior Level of Function : Independent/Modified Independent;Driving             Mobility Comments: able to walk around grocery store using cart, drives. later in conversation reports she reaches out for furniture to steady with ambulation in house ADLs Comments: independent,  throws laundry down stairs, uses bag to bring back upstairs     Extremity/Trunk Assessment   Upper Extremity Assessment Upper Extremity Assessment: Defer to OT evaluation;RUE deficits/detail    Lower Extremity Assessment Lower Extremity Assessment: RLE deficits/detail;Generalized weakness RLE Deficits / Details: R ankle OA, decreased dorsiflexion    Cervical / Trunk Assessment Cervical / Trunk Assessment: Other exceptions (L4 osteomyelitis)  Communication   Communication Communication: No apparent difficulties  Cognition Arousal: Alert Behavior During Therapy: WFL for tasks assessed/performed Overall Cognitive Status: Impaired/Different from baseline Area of Impairment: Safety/judgement                         Safety/Judgement: Decreased awareness of safety, Decreased awareness of deficits     General Comments: regarding iADLs        General Comments General comments (skin integrity, edema, etc.): VSS on RA        Assessment/Plan    PT Assessment Patient needs continued PT services  PT Problem List Decreased strength;Decreased range of motion;Decreased activity tolerance;Decreased balance;Decreased mobility;Decreased safety awareness;Pain       PT Treatment Interventions DME instruction;Gait training;Stair training;Functional mobility training;Therapeutic activities;Therapeutic exercise;Balance training;Cognitive remediation;Patient/family education    PT Goals (Current goals can be found in the Care Plan section)  Acute Rehab PT Goals PT Goal Formulation: With patient Time For Goal Achievement: 08/09/23 Potential to Achieve Goals: Fair    Frequency Min 1X/week        AM-PAC PT 6 Clicks Mobility  Outcome Measure Help needed turning from your back to your side while in a flat bed without using bedrails?: None Help needed moving from lying on your back to sitting on the side of a flat bed without using bedrails?: None Help needed moving to and  from a bed to a chair (including a wheelchair)?: None Help needed standing up from a chair using your arms (e.g., wheelchair or bedside chair)?: None Help needed to walk in hospital room?: A Little Help needed climbing 3-5 steps with a railing? : A Lot 6 Click Score: 21    End of Session   Activity Tolerance: Patient limited by pain Patient left: in bed;with call bell/phone within reach Nurse Communication: Mobility status PT Visit Diagnosis: Unsteadiness on feet (R26.81);Muscle weakness (generalized) (M62.81);History of falling (Z91.81);Pain Pain - Right/Left: Right Pain - part of body: Ankle and joints of foot (back)    Time: 8494-8475 PT Time Calculation (min) (ACUTE ONLY): 19 min   Charges:   PT Evaluation $PT Eval Low Complexity: 1 Low   PT General Charges $$ ACUTE PT VISIT: 1 Visit         Chevette Fee B. Fleeta Lapidus PT, DPT Acute Rehabilitation Services Please use secure chat or  Call Office [phone]   Almarie KATHEE Fleeta [facility] 07/26/2023, 3:50 PM

## 2023-07-27 DIAGNOSIS — M4626 Osteomyelitis of vertebra, lumbar region: Secondary | ICD-10-CM | POA: Diagnosis not present

## 2023-07-27 LAB — CK: Total CK: 37 U/L — ABNORMAL LOW (ref 38–234)

## 2023-07-27 LAB — CBC
HCT: 32.3 % — ABNORMAL LOW (ref 36.0–46.0)
Hemoglobin: 10.6 g/dL — ABNORMAL LOW (ref 12.0–15.0)
MCH: 28.3 pg (ref 26.0–34.0)
MCHC: 32.8 g/dL (ref 30.0–36.0)
MCV: 86.4 fL (ref 80.0–100.0)
Platelets: 299 10*3/uL (ref 150–400)
RBC: 3.74 MIL/uL — ABNORMAL LOW (ref 3.87–5.11)
RDW: 14.2 % (ref 11.5–15.5)
WBC: 8.7 10*3/uL (ref 4.0–10.5)
nRBC: 0 % (ref 0.0–0.2)

## 2023-07-27 LAB — BASIC METABOLIC PANEL
Anion gap: 9 (ref 5–15)
BUN: 9 mg/dL (ref 8–23)
CO2: 23 mmol/L (ref 22–32)
Calcium: 8.8 mg/dL — ABNORMAL LOW (ref 8.9–10.3)
Chloride: 101 mmol/L (ref 98–111)
Creatinine, Ser: 0.55 mg/dL (ref 0.44–1.00)
GFR, Estimated: >60 mL/min (ref >60)
Glucose, Bld: 120 mg/dL — ABNORMAL HIGH (ref 70–99)
Potassium: 3.3 mmol/L — ABNORMAL LOW (ref 3.5–5.1)
Sodium: 133 mmol/L — ABNORMAL LOW (ref 135–145)

## 2023-07-27 MED ORDER — CEFTRIAXONE IV (FOR PTA / DISCHARGE USE ONLY): 2.0000 g | INTRAVENOUS | 0 refills | Status: AC

## 2023-07-27 MED ORDER — CHLORHEXIDINE GLUCONATE CLOTH 2 % EX PADS
6.0000 | MEDICATED_PAD | Freq: Every day | CUTANEOUS | Status: DC
  Administered 2023-07-27: 6 via TOPICAL

## 2023-07-27 MED ORDER — METHOCARBAMOL 500 MG PO TABS: 500.0000 mg | ORAL_TABLET | Freq: Four times a day (QID) | ORAL | 1 refills | Status: AC | PRN

## 2023-07-27 MED ORDER — SODIUM CHLORIDE 0.9% FLUSH
10.0000 mL | Freq: Two times a day (BID) | INTRAVENOUS | Status: DC
  Administered 2023-07-27: 10 mL

## 2023-07-27 MED ORDER — SODIUM CHLORIDE 0.9% FLUSH: 10.0000 mL | INTRAVENOUS | Status: DC | PRN

## 2023-07-27 MED ORDER — OXYCODONE HCL 5 MG PO TABS: 2.5000 mg | ORAL_TABLET | ORAL | 0 refills | Status: AC | PRN

## 2023-07-27 MED ORDER — DAPTOMYCIN IV (FOR PTA / DISCHARGE USE ONLY): 500.0000 mg | INTRAVENOUS | 0 refills | Status: AC

## 2023-07-27 MED ORDER — HEPARIN SOD (PORK) LOCK FLUSH 100 UNIT/ML IV SOLN
250.0000 [IU] | INTRAVENOUS | Status: AC | PRN
  Administered 2023-07-27: 250 [IU]

## 2023-07-27 MED ORDER — POTASSIUM CHLORIDE CRYS ER 20 MEQ PO TBCR
40.0000 meq | EXTENDED_RELEASE_TABLET | Freq: Once | ORAL | Status: AC
  Administered 2023-07-27: 40 meq via ORAL
  Filled 2023-07-27: qty 2

## 2023-07-27 NOTE — Discharge Summary (Signed)
 Physician Discharge Summary   Stacey Coleman FMW:[id number] DOB: Sep 25, 1950 DOA: 07/24/2023  PCP: Stacey Lenis, MD  Admit date: 07/24/2023 Discharge date: 07/27/2023  Admitted From: Home Disposition:  Home Discharging physician: Coleman Apo, MD Barriers to discharge: none  Recommendations at discharge: Follow up with ID   Discharge Condition: stable CODE STATUS: Full  Diet recommendation:  Diet Orders (From admission, onward)     Start     Ordered   07/27/23 0000  Diet general        07/27/23 1228   07/25/23 1630  Diet regular Room service appropriate? Yes; Fluid consistency: Thin  Diet effective now       Question Answer Comment  Room service appropriate? Yes   Fluid consistency: Thin      07/25/23 1629            Hospital Course: Stacey Coleman is a 62 female with history of hypertension, hyperlipidemia, hypothyroidism, chronic back pain who presented with acute onset of new back pain.  Outpatient imaging concerning for possible osteomyelitis involving the lumbar spine.  No focal weakness.   Blood cultures collected.  IR consulted for CT-guided aspiration.  ID consulted.  Blood cultures and vertebral fluid studies remained negative. She was recommended for 6 weeks of antibiotics per ID with Rocephin  and daptomycin . PICC line was placed on 07/27/2023.   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  No focal deficits.  No fever or leukocytosis.  Outpatient imaging was concerning for osteomyelitis of lumbar spine.  It showed edematous changes involving the left posterior superior aspect of the L4 vertebral body, findings consistent with this osteomyelitis. IR consulted for CT-guided tissue sampling which was performed on 07/25/2023 and aspirate cultures remained negative - Neurosurgery signed off, no need for surgical intervention. -PICC line placed 07/27/2023 - 6-week course of daptomycin  and Rocephin  per ID; outpatient follow-up   Hypertension: On  Norvasc , benazepril -hydrochlorothiazide  at home   Hyperlipidemia: On Crestor    Hypothyroidism: On Synthyroid   The patient's acute and chronic medical conditions were treated accordingly. On day of discharge, patient was felt deemed stable for discharge. Patient/family member advised to call PCP or come back to ER if needed.   Principal Diagnosis: Osteomyelitis of lumbar spine [facility])  Discharge Diagnoses: Active Hospital Problems   Diagnosis Date Noted   Osteomyelitis of lumbar spine (HCC) 07/24/2023   Essential hypertension 08/15/2014   Hyperlipidemia 08/15/2014   Hypothyroid     Resolved Hospital Problems  No resolved problems to display.     Discharge Instructions     [facility] pharmacist to adjust dose for Vancomycin , Aminoglycosides and other anti-infective therapies as requested by physician.   Complete by: As directed    [facility] to provide Cath Flo 2mg    Complete by: As directed    Administer for PICC line occlusion and as ordered by physician for other access device issues.   Anaphylaxis Kit: Provided to treat any anaphylactic reaction to the medication being provided to the patient if First Dose or when requested by physician   Complete by: As directed    Epinephrine  1mg /ml vial / amp: Administer 0.3mg  (0.37ml) subcutaneously once for moderate to severe anaphylaxis, nurse to call physician and pharmacy when reaction occurs and call 911 if needed for immediate care   Diphenhydramine  50mg /ml IV vial: Administer 25-50mg  IV/IM PRN for first dose reaction, rash, itching, mild reaction, nurse to call physician and pharmacy when reaction occurs   Sodium Chloride  0.9%  NS 500ml IV: Administer if needed for hypovolemic blood pressure drop or as ordered by physician after call to physician with anaphylactic reaction   Change dressing on IV access line weekly and PRN   Complete by: As directed    Diet general   Complete by: As directed    Flush IV access  with Sodium Chloride  0.9% and Heparin  10 units/ml or 100 units/ml   Complete by: As directed    Home infusion instructions - [facility]   Complete by: As directed    Instructions: Flush IV access with Sodium Chloride  0.9% and Heparin  10units/ml or 100units/ml   Change dressing on IV access line: Weekly and PRN   Instructions Cath Flo 2mg : Administer for PICC Line occlusion and as ordered by physician for other access device   [facility] pharmacist to adjust dose for: Vancomycin , Aminoglycosides and other anti-infective therapies as requested by physician   Increase activity slowly   Complete by: As directed    Method of administration may be changed at the discretion of home infusion pharmacist based upon assessment of the patient and/or caregiver's ability to self-administer the medication ordered   Complete by: As directed    No wound care   Complete by: As directed       Allergies as of 07/27/2023       Reactions   Grass Pollen(k-o-r-t-swt Vern) Other (See Comments)   Shellfish Allergy Other (See Comments)   Never had, she doesn't like to eat shrimp.    Strawberry (diagnostic) Hives        Medication List     TAKE these medications    acetaminophen  500 MG tablet Commonly known as: TYLENOL  Take 1 tablet (500 mg total) by mouth every 6 (six) hours as needed (pain). What changed: how much to take   amLODipine  5 MG tablet Commonly known as: NORVASC  Take 5 mg by mouth daily.   benazepril -hydrochlorthiazide 20-12.5 MG tablet Commonly known as: LOTENSIN  HCT Take 1 tablet by mouth daily.   calcium  carbonate 500 MG chewable tablet Commonly known as: TUMS - dosed in mg elemental calcium  Chew 1 tablet by mouth daily as needed for indigestion or heartburn.   cefTRIAXone  IVPB Commonly known as: ROCEPHIN  Inject 2 g into the vein daily. Indication: discitis First Dose: Yes Last Day of Therapy:  09/05/2023 Labs - Once weekly:  CBC/D and BMP, Labs - Once  weekly: ESR and CRP Method of administration: IV Push Method of administration may be changed at the discretion of home infusion pharmacist based upon assessment of the patient and/or caregiver's ability to self-administer the medication ordered.   Cinnamon Extract 500 MG Tabs Take 500 mg by mouth daily.   CULTURELLE DIGESTIVE DAILY PO Take 1 capsule by mouth daily.   daptomycin  IVPB Commonly known as: CUBICIN  Inject 500 mg into the vein daily. Indication:  discitis First Dose: Yes Last Day of Therapy:  09/05/2023 Labs - Once weekly:  CBC/D, BMP, and CPK Labs - Once weekly: ESR and CRP Method of administration: IV Push Method of administration may be changed at the discretion of home infusion pharmacist based upon assessment of the patient and/or caregiver's ability to self-administer the medication ordered.   denosumab  60 MG/ML Sosy injection Commonly known as: PROLIA  Inject 60 mg into the skin every 6 (six) months. Next Injection 08/20/2023.   diclofenac Sodium 1 % Gel Commonly known as: VOLTAREN Apply 2 g topically daily as needed (pain).   diphenhydramine -acetaminophen  25-500 MG Tabs tablet  Commonly known as: TYLENOL  PM Take 0.5 tablets by mouth at bedtime.   estradiol  0.1 MG/GM vaginal cream Commonly known as: ESTRACE  Place 0.5 g vaginally 2 (two) times a week. Place 0.5g nightly for two weeks then twice a week after   fexofenadine 180 MG tablet Commonly known as: ALLEGRA Take 180 mg by mouth daily as needed for allergies (In the Spring).   levothyroxine  100 MCG tablet Commonly known as: SYNTHROID  Take 100 mcg by mouth daily before breakfast.   methocarbamol  500 MG tablet Commonly known as: ROBAXIN  Take 1 tablet (500 mg total) by mouth every 6 (six) hours as needed for muscle spasms.   OMEGA-3 PO Take 1,200 mg by mouth daily.   omeprazole 20 MG capsule Commonly known as: PRILOSEC Take 20 mg by mouth daily.   OVER THE COUNTER MEDICATION Apply 1 Application  topically 2 (two) times daily as needed (Pain). Helichrysum essential oils   oxyCODONE  5 MG immediate release tablet Commonly known as: Oxy IR/ROXICODONE  Take 0.5-1 tablets (2.5-5 mg total) by mouth every 4 (four) hours as needed for moderate pain (pain score 4-6) or severe pain (pain score 7-10).   PEPOGEST PO Take 1 capsule by mouth 3 (three) times daily with meals as needed (IBS).   rosuvastatin  5 MG tablet Commonly known as: CRESTOR  Take 5 mg by mouth daily.   Systane Balance 0.6 % Soln Generic drug: Propylene Glycol Place 1 drop into both eyes at bedtime.   triamcinolone  55 MCG/ACT Aero nasal inhaler Commonly known as: NASACORT  Place 2 sprays into both nostrils at bedtime.   Vitamin D3 50 MCG (2000 UT) capsule Take 2,000 Units by mouth daily.   vitamin k 100 MCG tablet Take 100 mcg by mouth daily. K2               Discharge Care Instructions  (From admission, onward)           Start     Ordered   07/27/23 0000  Change dressing on IV access line weekly and PRN  (Home infusion instructions - [facility] )        07/27/23 1105            Follow-up Information     [facility] Follow up.   Why: [facility] will provide home IV antibiotics.        [facility] Follow up.   Specialty: Home Health Services Why: [facility] will provide home health services.  They will call you in the next 24-48 hours to set up services. Contact information: [address] [place] [address] [phone]         Efrain Lamar ORN, MD Follow up on 08/24/2023.   Specialty: Infectious Diseases Why: 2:45 pm Contact information: [address] [place] [address] [phone]                Allergies  Allergen Reactions   Grass Pollen(K-O-R-T-Swt Vern) Other (See Comments)   Shellfish Allergy Other (See Comments)    Never had, she doesn't like to eat shrimp.    Strawberry (Diagnostic) Hives     Consultations: ID  Procedures:   Discharge Exam: BP 126/72 (BP Location: Left Arm)   Pulse 97   Temp 98.3 F (36.8 C)   Resp 18   Ht 5' (1.524 m)   Wt 64.4 kg   SpO2 97%   BMI 27.73 kg/m  Physical Exam Constitutional:      Appearance: Normal  appearance.  HENT:     Head: Normocephalic and atraumatic.     Mouth/Throat:     Mouth: Mucous membranes are moist.  Eyes:     Extraocular Movements: Extraocular movements intact.  Cardiovascular:     Rate and Rhythm: Normal rate and regular rhythm.  Pulmonary:     Effort: Pulmonary effort is normal. No respiratory distress.     Breath sounds: Normal breath sounds. No wheezing.  Abdominal:     General: Bowel sounds are normal. There is no distension.     Palpations: Abdomen is soft.     Tenderness: There is no abdominal tenderness.  Musculoskeletal:        General: Normal range of motion.     Cervical back: Normal range of motion and neck supple.  Skin:    General: Skin is warm and dry.  Neurological:     General: No focal deficit present.     Mental Status: She is alert.  Psychiatric:        Mood and Affect: Mood normal.      The results of significant diagnostics from this hospitalization (including imaging, microbiology, ancillary and laboratory) are listed below for reference.   Microbiology: Recent Results (from the past 240 hours)  Blood culture (routine x 2)     Status: None (Preliminary result)   Collection Time: 07/24/23  3:11 PM   Specimen: BLOOD LEFT ARM  Result Value Ref Range Status   Specimen Description BLOOD LEFT ARM  Final   Special Requests   Final    BOTTLES DRAWN AEROBIC AND ANAEROBIC Blood Culture results may not be optimal due to an inadequate volume of blood received in culture bottles   Culture   Final    NO GROWTH 3 DAYS Performed at [facility], [address], [place], [place] [address]    Report Status PENDING  Incomplete  Blood culture (routine x 2)     Status: None  (Preliminary result)   Collection Time: 07/24/23  6:44 PM   Specimen: BLOOD LEFT ARM  Result Value Ref Range Status   Specimen Description BLOOD LEFT ARM  Final   Special Requests   Final    BOTTLES DRAWN AEROBIC AND ANAEROBIC Blood Culture results may not be optimal due to an inadequate volume of blood received in culture bottles   Culture   Final    NO GROWTH 3 DAYS Performed at [facility], [address] [place], [place] [address]    Report Status PENDING  Incomplete  Aerobic/Anaerobic Culture w Gram Stain (surgical/deep wound)     Status: None (Preliminary result)   Collection Time: 07/25/23  3:36 PM   Specimen: Vertebra; Body Fluid  Result Value Ref Range Status   Specimen Description VERTEBRA  Final   Special Requests Normal  Final   Gram Stain NO WBC SEEN NO ORGANISMS SEEN   Final   Culture   Final    NO GROWTH 2 DAYS NO ANAEROBES ISOLATED; CULTURE IN PROGRESS FOR 5 DAYS Performed at [facility], [address] [place], [place] [address]    Report Status PENDING  Incomplete     Labs: BNP (last 3 results) No results for input(s): BNP in the last 8760 hours. Basic Metabolic Panel: Recent Labs  Lab 07/24/23 1511 07/25/23 0349 07/26/23 0606 07/27/23 0738  NA 136 138 134* 133*  K 3.6 3.5 3.7 3.3*  CL 103 107 103 101  CO2 23 21* 22 23  GLUCOSE 101* 122* 117* 120*  BUN 11 12 15 9   CREATININE 0.72 0.65 0.63 0.55  CALCIUM  9.7 9.1 9.2 8.8*   Liver Function Tests: Recent Labs  Lab 07/24/23 1511  AST 20  ALT 13  ALKPHOS 69  BILITOT 0.5  PROT 7.6  ALBUMIN 3.6   No results for input(s): LIPASE, AMYLASE in the last 168 hours. No results for input(s): AMMONIA in the last 168 hours. CBC: Recent Labs  Lab 07/24/23 1511 07/25/23 0349 07/26/23 0606 07/27/23 0738  WBC 7.2 7.6 9.2 8.7  NEUTROABS 4.1  --   --   --   HGB 12.4 11.1* 11.2* 10.6*  HCT 39.1 35.2* 34.4* 32.3*  MCV 89.1 88.9 87.3 86.4  PLT 359 341 327 299   Cardiac  Enzymes: Recent Labs  Lab 07/27/23 0738  CKTOTAL 37*   BNP: Invalid input(s): POCBNP CBG: Recent Labs  Lab 07/25/23 0415  GLUCAP 82   D-Dimer No results for input(s): DDIMER in the last 72 hours. Hgb A1c No results for input(s): HGBA1C in the last 72 hours. Lipid Profile No results for input(s): CHOL, HDL, LDLCALC, TRIG, CHOLHDL, LDLDIRECT in the last 72 hours. Thyroid  function studies No results for input(s): TSH, T4TOTAL, T3FREE, THYROIDAB in the last 72 hours.  Invalid input(s): FREET3 Anemia work up No results for input(s): VITAMINB12, FOLATE, FERRITIN, TIBC, IRON, RETICCTPCT in the last 72 hours. Urinalysis    Component Value Date/Time   COLORURINE YELLOW 08/20/2014 1610   APPEARANCEUR HAZY (A) 08/20/2014 1610   LABSPEC 1.008 08/20/2014 1610   PHURINE 5.5 08/20/2014 1610   GLUCOSEU NEGATIVE 08/20/2014 1610   HGBUR NEGATIVE 08/20/2014 1610   BILIRUBINUR Negative 11/26/2022 1108   KETONESUR NEGATIVE 08/20/2014 1610   PROTEINUR Negative 11/26/2022 1108   PROTEINUR NEGATIVE 08/20/2014 1610   UROBILINOGEN 0.2 11/26/2022 1108   UROBILINOGEN 0.2 08/20/2014 1610   NITRITE Negative 11/26/2022 1108   NITRITE NEGATIVE 08/20/2014 1610   LEUKOCYTESUR Small (1+) (A) 11/26/2022 1108   Sepsis Labs Recent Labs  Lab 07/24/23 1511 07/25/23 0349 07/26/23 0606 07/27/23 0738  WBC 7.2 7.6 9.2 8.7   Microbiology Recent Results (from the past 240 hours)  Blood culture (routine x 2)     Status: None (Preliminary result)   Collection Time: 07/24/23  3:11 PM   Specimen: BLOOD LEFT ARM  Result Value Ref Range Status   Specimen Description BLOOD LEFT ARM  Final   Special Requests   Final    BOTTLES DRAWN AEROBIC AND ANAEROBIC Blood Culture results may not be optimal due to an inadequate volume of blood received in culture bottles   Culture   Final    NO GROWTH 3 DAYS Performed at [facility], [address], [place], [place]  [address]    Report Status PENDING  Incomplete  Blood culture (routine x 2)     Status: None (Preliminary result)   Collection Time: 07/24/23  6:44 PM   Specimen: BLOOD LEFT ARM  Result Value Ref Range Status   Specimen Description BLOOD LEFT ARM  Final   Special Requests   Final    BOTTLES DRAWN AEROBIC AND ANAEROBIC Blood Culture results may not be optimal due to an inadequate volume of blood received in culture bottles   Culture   Final    NO GROWTH 3 DAYS Performed at [facility], [address] [place], [place] [address]    Report Status PENDING  Incomplete  Aerobic/Anaerobic Culture w Gram Stain (surgical/deep  wound)     Status: None (Preliminary result)   Collection Time: 07/25/23  3:36 PM   Specimen: Vertebra; Body Fluid  Result Value Ref Range Status   Specimen Description VERTEBRA  Final   Special Requests Normal  Final   Gram Stain NO WBC SEEN NO ORGANISMS SEEN   Final   Culture   Final    NO GROWTH 2 DAYS NO ANAEROBES ISOLATED; CULTURE IN PROGRESS FOR 5 DAYS Performed at [facility], [address], [place], [place] [address]    Report Status PENDING  Incomplete    Procedures/Studies: US  EKG SITE RITE Result Date: 07/26/2023 If Site Rite image not attached, placement could not be confirmed due to current cardiac rhythm.    Time coordinating discharge: Over 30 minutes    Alm Apo, MD  [facility] 07/27/2023, 2:41 PM

## 2023-07-27 NOTE — TOC Transition Note (Signed)
 Transition of Care [facility]) - Discharge Note   Patient Details  Name: Stacey Coleman MRN: [id number] Date of Birth: 1950/10/29  Transition of Care [facility]) CM/SW Contact:  Rosaline JONELLE Joe, RN Phone Number: 07/27/2023, 12:50 PM   Clinical Narrative:    CM spoke with Holley Herring, RNCM with [facility] and OPAT will need to be signed by attending MD so that home antibiotics can be coordinated and delivered.  OPAT was signed by Dr. Patsy and prescription was faxed to [facility] at fax # [phone] and she confirmed receipt.  P  Patient is receiving her doses of IV antibiotics through the PICC line that was placed today and patient will be sent home with an IV extension so that she can continue her regimen ordered starting tomorrow.  Patient's brother will assist with transportation to home by car.  I called [facility] and they plan to call the patient and schedule first visit starting tomorrow.  HH orders were placed yesterday.  No other TOC needs.  Patient plans to follow up with Dr. Rosea office for start of PT in the home through [address] when she has been cleared by surgery.  No other TOC needs  and patient will discharge home today by bedside nursing.         Patient Goals and CMS Choice            Discharge Placement                       Discharge Plan and Services Additional resources added to the After Visit Summary for                                       Social Drivers of Health (SDOH) Interventions SDOH Screenings   Food Insecurity: No Food Insecurity (07/25/2023)  Housing: Low Risk  (07/25/2023)  Transportation Needs: Unmet Transportation Needs (07/25/2023)  Utilities: At Risk (07/25/2023)  Social Connections: Moderately Integrated (07/25/2023)  Tobacco Use: Low Risk  (07/25/2023)     Readmission Risk Interventions    07/26/2023   11:46 AM  Readmission Risk Prevention Plan  Post Dischage Appt Complete  Medication  Screening Complete  Transportation Screening Complete

## 2023-07-27 NOTE — Progress Notes (Addendum)
 [facility]   Reason for visit: Follow up on discitis  Interval History: No growth to date from aspiration culture, blood cultures remain negative at 3 days.  Tmax of 100.6.  WBC remains within normal limits.    Physical Exam: Constitutional:  Vitals:   07/27/23 0527 07/27/23 0747  BP: (!) 100/51 126/72  Pulse: 96 97  Resp: 18 18  Temp: 98.4 F (36.9 C) 98.3 F (36.8 C)  SpO2: 97% 97%   patient appears in NAD Respiratory: Normal respiratory effort  Review of Systems: Constitutional: negative for chills  Lab Results  Component Value Date   WBC 8.7 07/27/2023   HGB 10.6 (L) 07/27/2023   HCT 32.3 (L) 07/27/2023   MCV 86.4 07/27/2023   PLT 299 07/27/2023    Lab Results  Component Value Date   CREATININE 0.55 07/27/2023   BUN 9 07/27/2023   NA 133 (L) 07/27/2023   K 3.3 (L) 07/27/2023   CL 101 07/27/2023   CO2 23 07/27/2023    Lab Results  Component Value Date   ALT 13 07/24/2023   AST 20 07/24/2023   ALKPHOS 69 07/24/2023     Microbiology: Recent Results (from the past 240 hours)  Blood culture (routine x 2)     Status: None (Preliminary result)   Collection Time: 07/24/23  3:11 PM   Specimen: BLOOD LEFT ARM  Result Value Ref Range Status   Specimen Description BLOOD LEFT ARM  Final   Special Requests   Final    BOTTLES DRAWN AEROBIC AND ANAEROBIC Blood Culture results may not be optimal due to an inadequate volume of blood received in culture bottles   Culture   Final    NO GROWTH 3 DAYS Performed at [facility], [address] [place], [place] [address]    Report Status PENDING  Incomplete  Blood culture (routine x 2)     Status: None (Preliminary result)   Collection Time: 07/24/23  6:44 PM   Specimen: BLOOD LEFT ARM  Result Value Ref Range Status   Specimen Description BLOOD LEFT ARM  Final   Special Requests   Final    BOTTLES DRAWN AEROBIC AND ANAEROBIC Blood Culture results may not be optimal due to an  inadequate volume of blood received in culture bottles   Culture   Final    NO GROWTH 3 DAYS Performed at [facility], [address] [place], [place] [address]    Report Status PENDING  Incomplete  Aerobic/Anaerobic Culture w Gram Stain (surgical/deep wound)     Status: None (Preliminary result)   Collection Time: 07/25/23  3:36 PM   Specimen: Vertebra; Body Fluid  Result Value Ref Range Status   Specimen Description VERTEBRA  Final   Special Requests Normal  Final   Gram Stain NO WBC SEEN NO ORGANISMS SEEN   Final   Culture   Final    NO GROWTH 2 DAYS NO ANAEROBES ISOLATED; CULTURE IN PROGRESS FOR 5 DAYS Performed at [facility], [address], [place], [place] [address]    Report Status PENDING  Incomplete    Impression/Plan:  1. 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  No positive findings on culture and will continue to monitor.  Blood cultures are negative at 3 days. At this point we will have her continue with daptomycin  and ceftriaxone  for planned 6 weeks. Will continue to monitor  cultures and can monitor these after discharge and make changes as needed.  Diagnosis: discitis  Culture Result: no growth  Allergies  Allergen Reactions   Grass Pollen(K-O-R-T-Swt Vern) Other (See Comments)   Shellfish Allergy Other (See Comments)    Never had, she doesn't like to eat shrimp.    Strawberry (Diagnostic) Hives    OPAT Orders Discharge antibiotics to be given via PICC line Discharge antibiotics: ceftriaxone  2 grams IV daily + daptomycin  500 mg IV daily Per pharmacy protocol yes Duration: 6 weeks End Date: 09/05/23  [facility] Care Per Protocol: yes  Home health RN for IV administration and teaching; PICC line care and labs.    Labs weekly while on IV antibiotics: _x_ CBC with differential __ BMP _x_ CMP _x_ CRP _x_ ESR __ Vancomycin  trough __x CK  __ Please pull PIC at completion of IV  antibiotics _x_ Please leave PIC in place until doctor has seen patient or been notified  Fax weekly labs to [phone]  Clinic Follow Up Appt: 08/24/23 at 2:45 with Dr. Efrain

## 2023-07-27 NOTE — Plan of Care (Signed)
  Problem: Clinical Measurements: Goal: Ability to maintain clinical measurements within normal limits will improve Outcome: Progressing   Problem: Activity: Goal: Risk for activity intolerance will decrease Outcome: Progressing   Problem: Pain Management: Goal: General experience of comfort will improve Outcome: Progressing

## 2023-07-27 NOTE — Progress Notes (Signed)
 PHARMACY CONSULT NOTE FOR:  OUTPATIENT  PARENTERAL ANTIBIOTIC THERAPY (OPAT)  Indication: discitis Regimen: Ceftriaxone  2 g IV daily + daptomycin  500 mg IV daily End date: 09/05/2023  IV antibiotic discharge orders are pended. To discharging provider:  please sign these orders via discharge navigator,  Select New Orders & click on the button choice - Manage This Unsigned Work.     Thank you for allowing pharmacy to be a part of this patient's care.  Josefa Range, PharmD PGY1 Pharmacy Resident 07/27/2023 10:00 AM

## 2023-07-27 NOTE — Progress Notes (Signed)
 Peripherally Inserted Central Catheter Placement  The IV Nurse has discussed with the patient and/or persons authorized to consent for the patient, the purpose of this procedure and the potential benefits and risks involved with this procedure.  The benefits include less needle sticks, lab draws from the catheter, and the patient may be discharged home with the catheter. Risks include, but not limited to, infection, bleeding, blood clot (thrombus formation), and puncture of an artery; nerve damage and irregular heartbeat and possibility to perform a PICC exchange if needed/ordered by physician.  Alternatives to this procedure were also discussed.  Bard Power PICC patient education guide, fact sheet on infection prevention and patient information card has been provided to patient /or left at bedside.    PICC Placement Documentation  PICC Single Lumen 07/27/23 Left Brachial 44 cm 0 cm (Active)  Line Status Flushed;Blood return noted;Saline locked 07/27/23 1147  Dressing Type Transparent;Securing device 07/27/23 1147  Dressing Status Antimicrobial disc in place;Clean, Dry, Intact 07/27/23 1147  Line Adjustment (NICU/IV Team Only) No 07/27/23 1147  Dressing Change Due 08/03/23 07/27/23 1147       Bonni Rock Larve 07/27/2023, 12:04 PM

## 2023-07-27 NOTE — Hospital Course (Signed)
 Stacey Coleman is a 58 female with history of hypertension, hyperlipidemia, hypothyroidism, chronic back pain who presented with acute onset of new back pain.  Outpatient imaging concerning for possible osteomyelitis involving the lumbar spine.  No focal weakness.   Blood cultures collected.  IR consulted for CT-guided aspiration.  ID consulted.  Blood cultures and vertebral fluid studies remained negative. She was recommended for 6 weeks of antibiotics per ID with Rocephin  and daptomycin . PICC line was placed on 07/27/2023.   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  No focal deficits.  No fever or leukocytosis.  Outpatient imaging was concerning for osteomyelitis of lumbar spine.  It showed edematous changes involving the left posterior superior aspect of the L4 vertebral body, findings consistent with this osteomyelitis. IR consulted for CT-guided tissue sampling which was performed on 07/25/2023 and aspirate cultures remained negative - Neurosurgery signed off, no need for surgical intervention. -PICC line placed 07/27/2023 - 6-week course of daptomycin  and Rocephin  per ID; outpatient follow-up   Hypertension: On Norvasc , benazepril -hydrochlorothiazide  at home   Hyperlipidemia: On Crestor    Hypothyroidism: On Synthyroid

## 2023-07-27 NOTE — Evaluation (Signed)
 Occupational Therapy Evaluation Patient Details Name: Stacey Coleman MRN: [id number] DOB: March 12, 1951 Today's Date: 07/27/2023   History of Present Illness 73 y.o. female who presents to ED with back pain and outpatient imaging concerning for possible osteomyelitis involving the L4 vertebral body PICC line placement for d/c home with IV antibiotics  PMH: hypertension, hyperlipidemia, hypothyroidism, R shoulder surgery 11/14 and chronic back pain   Clinical Impression   Pt in good spirits, eager to return home, c/o moderate pain to back and ankle, and stiffness and pain with ROM with R shoulder from prior injury. Pt reports has OP therapy follow up next week for R shoulder, has been in a sling for 6 weeks. Pt currently at baseline, independent with ADLs and mobility without AD. Pt has no acute OT or follow up needs, no DME needs.       If plan is discharge home, recommend the following: Assist for transportation    Functional Status Assessment  Patient has had a recent decline in their functional status and demonstrates the ability to make significant improvements in function in a reasonable and predictable amount of time.  Equipment Recommendations  None recommended by OT    Recommendations for Other Services       Precautions / Restrictions Precautions Precautions: Fall Restrictions Weight Bearing Restrictions Per Provider Order: No      Mobility Bed Mobility Overal bed mobility: Modified Independent                  Transfers Overall transfer level: Modified independent                        Balance Overall balance assessment: Modified Independent                                         ADL either performed or assessed with clinical judgement   ADL Overall ADL's : Modified independent                                             Vision Baseline Vision/History: 1 Wears glasses Ability to See in Adequate Light: 0  Adequate Patient Visual Report: No change from baseline       Perception         Praxis         Pertinent Vitals/Pain Pain Assessment Pain Assessment: 0-10 Pain Score: 5  Pain Location: low back, R ankle Pain Descriptors / Indicators: Aching, Sore, Grimacing, Guarding Pain Intervention(s): Monitored during session     Extremity/Trunk Assessment Upper Extremity Assessment Upper Extremity Assessment: RUE deficits/detail;LUE deficits/detail RUE Deficits / Details: history of fx 11/14, follow up with OP therapy next week.B 3rd digit trigger fingers RUE: Shoulder pain with ROM RUE Sensation: WNL RUE Coordination: decreased gross motor LUE Deficits / Details: B 3rd digit trigger fingers LUE Sensation: WNL LUE Coordination: WNL   Lower Extremity Assessment Lower Extremity Assessment: Defer to PT evaluation       Communication Communication Communication: No apparent difficulties   Cognition Arousal: Alert Behavior During Therapy: WFL for tasks assessed/performed Overall Cognitive Status: Within Functional Limits for tasks assessed  General Comments: fully participate, A/Ox4     General Comments       Exercises     Shoulder Instructions      Home Living Family/patient expects to be discharged to:: Private residence Living Arrangements: Alone Available Help at Discharge: Family;Available PRN/intermittently Type of Home: House Home Access: Stairs to enter [employer] of Steps: 5 Entrance Stairs-Rails: Can reach both Home Layout: Two level;Bed/bath upstairs;Able to live on main level with bedroom/bathroom;Full bath on main level Alternate Level Stairs-Number of Steps: 13 Alternate Level Stairs-Rails: Right;Left Bathroom Shower/Tub: Chief Strategy Officer: Standard Bathroom Accessibility: Yes   Home Equipment: Agricultural Consultant (2 wheels);Shower seat;Cane - single point;Tub bench   Additional  Comments: Pt lives alone, has some assistance from family/friends if needed.      Prior Functioning/Environment Prior Level of Function : Independent/Modified Independent;Driving             Mobility Comments: able to walk around grocery store using cart, drives. later in conversation reports she reaches out for furniture to steady with ambulation in house ADLs Comments: independent, throws laundry down stairs, uses bag to bring back upstairs        OT Problem List: Pain;Decreased strength;Decreased range of motion;Impaired UE functional use      OT Treatment/Interventions:      OT Goals(Current goals can be found in the care plan section) Acute Rehab OT Goals Patient Stated Goal: to return home OT Goal Formulation: With patient Time For Goal Achievement: 08/10/23 Potential to Achieve Goals: Good  OT Frequency:      Co-evaluation              AM-PAC OT 6 Clicks Daily Activity     Outcome Measure Help from another person eating meals?: None Help from another person taking care of personal grooming?: None Help from another person toileting, which includes using toliet, bedpan, or urinal?: None Help from another person bathing (including washing, rinsing, drying)?: None Help from another person to put on and taking off regular upper body clothing?: None Help from another person to put on and taking off regular lower body clothing?: None 6 Click Score: 24   End of Session Equipment Utilized During Treatment: Gait belt Nurse Communication: Mobility status  Activity Tolerance: Patient tolerated treatment well Patient left: in bed;with call bell/phone within reach  OT Visit Diagnosis: Pain Pain - Right/Left: Right Pain - part of body: Ankle and joints of foot (back)                Time: 1240-1315 OT Time Calculation (min): 35 min Charges:  OT General Charges $OT Visit: 1 Visit OT Evaluation $OT Eval Low Complexity: 1 Low OT Treatments $Self Care/Home  Management : 8-22 mins  [place], OTR/L   Elouise JONELLE Bott 07/27/2023, 1:33 PM

## 2023-07-27 NOTE — Plan of Care (Signed)
  Problem: Education: Goal: Knowledge of General Education information will improve Description: Including pain rating scale, medication(s)/side effects and non-pharmacologic comfort measures Outcome: Adequate for Discharge   Problem: Clinical Measurements: Goal: Will remain free from infection Outcome: Adequate for Discharge   Problem: Nutrition: Goal: Adequate nutrition will be maintained Outcome: Adequate for Discharge

## 2023-07-28 DIAGNOSIS — E039 Hypothyroidism, unspecified: Secondary | ICD-10-CM | POA: Diagnosis not present

## 2023-07-28 DIAGNOSIS — M4626 Osteomyelitis of vertebra, lumbar region: Secondary | ICD-10-CM | POA: Diagnosis not present

## 2023-07-28 DIAGNOSIS — M47817 Spondylosis without myelopathy or radiculopathy, lumbosacral region: Secondary | ICD-10-CM | POA: Diagnosis not present

## 2023-07-28 DIAGNOSIS — I10 Essential (primary) hypertension: Secondary | ICD-10-CM | POA: Diagnosis not present

## 2023-07-28 DIAGNOSIS — E785 Hyperlipidemia, unspecified: Secondary | ICD-10-CM | POA: Diagnosis not present

## 2023-07-28 DIAGNOSIS — M4646 Discitis, unspecified, lumbar region: Secondary | ICD-10-CM | POA: Diagnosis not present

## 2023-07-28 DIAGNOSIS — M47816 Spondylosis without myelopathy or radiculopathy, lumbar region: Secondary | ICD-10-CM | POA: Diagnosis not present

## 2023-07-28 DIAGNOSIS — G8929 Other chronic pain: Secondary | ICD-10-CM | POA: Diagnosis not present

## 2023-07-28 DIAGNOSIS — M47818 Spondylosis without myelopathy or radiculopathy, sacral and sacrococcygeal region: Secondary | ICD-10-CM | POA: Diagnosis not present

## 2023-07-29 LAB — CULTURE, BLOOD (ROUTINE X 2)
Culture: NO GROWTH
Culture: NO GROWTH

## 2023-07-30 LAB — AEROBIC/ANAEROBIC CULTURE W GRAM STAIN (SURGICAL/DEEP WOUND)
Culture: NO GROWTH
Gram Stain: NONE SEEN
Special Requests: NORMAL

## 2023-08-01 DIAGNOSIS — M47817 Spondylosis without myelopathy or radiculopathy, lumbosacral region: Secondary | ICD-10-CM | POA: Diagnosis not present

## 2023-08-01 DIAGNOSIS — M4626 Osteomyelitis of vertebra, lumbar region: Secondary | ICD-10-CM | POA: Diagnosis not present

## 2023-08-01 DIAGNOSIS — E039 Hypothyroidism, unspecified: Secondary | ICD-10-CM | POA: Diagnosis not present

## 2023-08-01 DIAGNOSIS — E785 Hyperlipidemia, unspecified: Secondary | ICD-10-CM | POA: Diagnosis not present

## 2023-08-01 DIAGNOSIS — I10 Essential (primary) hypertension: Secondary | ICD-10-CM | POA: Diagnosis not present

## 2023-08-01 DIAGNOSIS — M4646 Discitis, unspecified, lumbar region: Secondary | ICD-10-CM | POA: Diagnosis not present

## 2023-08-01 DIAGNOSIS — M47818 Spondylosis without myelopathy or radiculopathy, sacral and sacrococcygeal region: Secondary | ICD-10-CM | POA: Diagnosis not present

## 2023-08-01 DIAGNOSIS — G8929 Other chronic pain: Secondary | ICD-10-CM | POA: Diagnosis not present

## 2023-08-01 DIAGNOSIS — M47816 Spondylosis without myelopathy or radiculopathy, lumbar region: Secondary | ICD-10-CM | POA: Diagnosis not present

## 2023-08-04 DIAGNOSIS — M47816 Spondylosis without myelopathy or radiculopathy, lumbar region: Secondary | ICD-10-CM | POA: Diagnosis not present

## 2023-08-04 DIAGNOSIS — E785 Hyperlipidemia, unspecified: Secondary | ICD-10-CM | POA: Diagnosis not present

## 2023-08-04 DIAGNOSIS — M4646 Discitis, unspecified, lumbar region: Secondary | ICD-10-CM | POA: Diagnosis not present

## 2023-08-04 DIAGNOSIS — E039 Hypothyroidism, unspecified: Secondary | ICD-10-CM | POA: Diagnosis not present

## 2023-08-04 DIAGNOSIS — I10 Essential (primary) hypertension: Secondary | ICD-10-CM | POA: Diagnosis not present

## 2023-08-04 DIAGNOSIS — G8929 Other chronic pain: Secondary | ICD-10-CM | POA: Diagnosis not present

## 2023-08-04 DIAGNOSIS — M4647 Discitis, unspecified, lumbosacral region: Secondary | ICD-10-CM | POA: Diagnosis not present

## 2023-08-04 DIAGNOSIS — M47817 Spondylosis without myelopathy or radiculopathy, lumbosacral region: Secondary | ICD-10-CM | POA: Diagnosis not present

## 2023-08-04 DIAGNOSIS — M4626 Osteomyelitis of vertebra, lumbar region: Secondary | ICD-10-CM | POA: Diagnosis not present

## 2023-08-04 DIAGNOSIS — M47818 Spondylosis without myelopathy or radiculopathy, sacral and sacrococcygeal region: Secondary | ICD-10-CM | POA: Diagnosis not present

## 2023-08-05 DIAGNOSIS — M47816 Spondylosis without myelopathy or radiculopathy, lumbar region: Secondary | ICD-10-CM | POA: Diagnosis not present

## 2023-08-05 DIAGNOSIS — M4646 Discitis, unspecified, lumbar region: Secondary | ICD-10-CM | POA: Diagnosis not present

## 2023-08-05 DIAGNOSIS — M47817 Spondylosis without myelopathy or radiculopathy, lumbosacral region: Secondary | ICD-10-CM | POA: Diagnosis not present

## 2023-08-05 DIAGNOSIS — E039 Hypothyroidism, unspecified: Secondary | ICD-10-CM | POA: Diagnosis not present

## 2023-08-05 DIAGNOSIS — I10 Essential (primary) hypertension: Secondary | ICD-10-CM | POA: Diagnosis not present

## 2023-08-05 DIAGNOSIS — E785 Hyperlipidemia, unspecified: Secondary | ICD-10-CM | POA: Diagnosis not present

## 2023-08-05 DIAGNOSIS — G8929 Other chronic pain: Secondary | ICD-10-CM | POA: Diagnosis not present

## 2023-08-05 DIAGNOSIS — M4626 Osteomyelitis of vertebra, lumbar region: Secondary | ICD-10-CM | POA: Diagnosis not present

## 2023-08-05 DIAGNOSIS — M47818 Spondylosis without myelopathy or radiculopathy, sacral and sacrococcygeal region: Secondary | ICD-10-CM | POA: Diagnosis not present

## 2023-08-05 LAB — LAB REPORT - SCANNED: EGFR: 85

## 2023-08-06 DIAGNOSIS — M4626 Osteomyelitis of vertebra, lumbar region: Secondary | ICD-10-CM | POA: Diagnosis not present

## 2023-08-08 DIAGNOSIS — E039 Hypothyroidism, unspecified: Secondary | ICD-10-CM | POA: Diagnosis not present

## 2023-08-08 DIAGNOSIS — M4626 Osteomyelitis of vertebra, lumbar region: Secondary | ICD-10-CM | POA: Diagnosis not present

## 2023-08-08 DIAGNOSIS — G8929 Other chronic pain: Secondary | ICD-10-CM | POA: Diagnosis not present

## 2023-08-08 DIAGNOSIS — E785 Hyperlipidemia, unspecified: Secondary | ICD-10-CM | POA: Diagnosis not present

## 2023-08-08 DIAGNOSIS — M47818 Spondylosis without myelopathy or radiculopathy, sacral and sacrococcygeal region: Secondary | ICD-10-CM | POA: Diagnosis not present

## 2023-08-08 DIAGNOSIS — M4646 Discitis, unspecified, lumbar region: Secondary | ICD-10-CM | POA: Diagnosis not present

## 2023-08-08 DIAGNOSIS — I10 Essential (primary) hypertension: Secondary | ICD-10-CM | POA: Diagnosis not present

## 2023-08-08 DIAGNOSIS — M47816 Spondylosis without myelopathy or radiculopathy, lumbar region: Secondary | ICD-10-CM | POA: Diagnosis not present

## 2023-08-08 DIAGNOSIS — M47817 Spondylosis without myelopathy or radiculopathy, lumbosacral region: Secondary | ICD-10-CM | POA: Diagnosis not present

## 2023-08-08 DIAGNOSIS — M4647 Discitis, unspecified, lumbosacral region: Secondary | ICD-10-CM | POA: Diagnosis not present

## 2023-08-09 LAB — LAB REPORT - SCANNED: EGFR: 93

## 2023-08-11 DIAGNOSIS — M4646 Discitis, unspecified, lumbar region: Secondary | ICD-10-CM | POA: Diagnosis not present

## 2023-08-11 DIAGNOSIS — M47816 Spondylosis without myelopathy or radiculopathy, lumbar region: Secondary | ICD-10-CM | POA: Diagnosis not present

## 2023-08-11 DIAGNOSIS — G8929 Other chronic pain: Secondary | ICD-10-CM | POA: Diagnosis not present

## 2023-08-11 DIAGNOSIS — M47818 Spondylosis without myelopathy or radiculopathy, sacral and sacrococcygeal region: Secondary | ICD-10-CM | POA: Diagnosis not present

## 2023-08-11 DIAGNOSIS — E785 Hyperlipidemia, unspecified: Secondary | ICD-10-CM | POA: Diagnosis not present

## 2023-08-11 DIAGNOSIS — I10 Essential (primary) hypertension: Secondary | ICD-10-CM | POA: Diagnosis not present

## 2023-08-11 DIAGNOSIS — M4626 Osteomyelitis of vertebra, lumbar region: Secondary | ICD-10-CM | POA: Diagnosis not present

## 2023-08-11 DIAGNOSIS — E039 Hypothyroidism, unspecified: Secondary | ICD-10-CM | POA: Diagnosis not present

## 2023-08-11 DIAGNOSIS — M47817 Spondylosis without myelopathy or radiculopathy, lumbosacral region: Secondary | ICD-10-CM | POA: Diagnosis not present

## 2023-08-12 ENCOUNTER — Telehealth: Payer: Self-pay | Admitting: Pharmacist

## 2023-08-12 DIAGNOSIS — I10 Essential (primary) hypertension: Secondary | ICD-10-CM | POA: Diagnosis not present

## 2023-08-12 DIAGNOSIS — M4626 Osteomyelitis of vertebra, lumbar region: Secondary | ICD-10-CM | POA: Diagnosis not present

## 2023-08-12 DIAGNOSIS — M47818 Spondylosis without myelopathy or radiculopathy, sacral and sacrococcygeal region: Secondary | ICD-10-CM | POA: Diagnosis not present

## 2023-08-12 DIAGNOSIS — E785 Hyperlipidemia, unspecified: Secondary | ICD-10-CM | POA: Diagnosis not present

## 2023-08-12 DIAGNOSIS — E039 Hypothyroidism, unspecified: Secondary | ICD-10-CM | POA: Diagnosis not present

## 2023-08-12 DIAGNOSIS — M4646 Discitis, unspecified, lumbar region: Secondary | ICD-10-CM | POA: Diagnosis not present

## 2023-08-12 DIAGNOSIS — M47817 Spondylosis without myelopathy or radiculopathy, lumbosacral region: Secondary | ICD-10-CM | POA: Diagnosis not present

## 2023-08-12 DIAGNOSIS — M47816 Spondylosis without myelopathy or radiculopathy, lumbar region: Secondary | ICD-10-CM | POA: Diagnosis not present

## 2023-08-12 DIAGNOSIS — G8929 Other chronic pain: Secondary | ICD-10-CM | POA: Diagnosis not present

## 2023-08-12 NOTE — Telephone Encounter (Signed)
[facility] notified [facility] pharmacy team of increased CK at 210 this Monday. Patient will have repeat labs drawn on Monday, so we will trend those and then follow up. Not concerned at this time given mild elevation.   Margarite Gouge, PharmD, CPP, BCIDP, AAHIVP Clinical Pharmacist Practitioner Infectious Diseases Clinical Pharmacist [facility]

## 2023-08-15 DIAGNOSIS — I10 Essential (primary) hypertension: Secondary | ICD-10-CM | POA: Diagnosis not present

## 2023-08-15 DIAGNOSIS — M4626 Osteomyelitis of vertebra, lumbar region: Secondary | ICD-10-CM | POA: Diagnosis not present

## 2023-08-15 DIAGNOSIS — M47817 Spondylosis without myelopathy or radiculopathy, lumbosacral region: Secondary | ICD-10-CM | POA: Diagnosis not present

## 2023-08-15 DIAGNOSIS — M4646 Discitis, unspecified, lumbar region: Secondary | ICD-10-CM | POA: Diagnosis not present

## 2023-08-15 DIAGNOSIS — Z79899 Other long term (current) drug therapy: Secondary | ICD-10-CM | POA: Diagnosis not present

## 2023-08-15 DIAGNOSIS — G8929 Other chronic pain: Secondary | ICD-10-CM | POA: Diagnosis not present

## 2023-08-15 DIAGNOSIS — M47818 Spondylosis without myelopathy or radiculopathy, sacral and sacrococcygeal region: Secondary | ICD-10-CM | POA: Diagnosis not present

## 2023-08-15 DIAGNOSIS — E785 Hyperlipidemia, unspecified: Secondary | ICD-10-CM | POA: Diagnosis not present

## 2023-08-15 DIAGNOSIS — M47816 Spondylosis without myelopathy or radiculopathy, lumbar region: Secondary | ICD-10-CM | POA: Diagnosis not present

## 2023-08-15 DIAGNOSIS — E039 Hypothyroidism, unspecified: Secondary | ICD-10-CM | POA: Diagnosis not present

## 2023-08-16 DIAGNOSIS — M47817 Spondylosis without myelopathy or radiculopathy, lumbosacral region: Secondary | ICD-10-CM | POA: Diagnosis not present

## 2023-08-16 DIAGNOSIS — M4646 Discitis, unspecified, lumbar region: Secondary | ICD-10-CM | POA: Diagnosis not present

## 2023-08-16 DIAGNOSIS — M47816 Spondylosis without myelopathy or radiculopathy, lumbar region: Secondary | ICD-10-CM | POA: Diagnosis not present

## 2023-08-16 DIAGNOSIS — E785 Hyperlipidemia, unspecified: Secondary | ICD-10-CM | POA: Diagnosis not present

## 2023-08-16 DIAGNOSIS — E039 Hypothyroidism, unspecified: Secondary | ICD-10-CM | POA: Diagnosis not present

## 2023-08-16 DIAGNOSIS — I10 Essential (primary) hypertension: Secondary | ICD-10-CM | POA: Diagnosis not present

## 2023-08-16 DIAGNOSIS — M47818 Spondylosis without myelopathy or radiculopathy, sacral and sacrococcygeal region: Secondary | ICD-10-CM | POA: Diagnosis not present

## 2023-08-16 DIAGNOSIS — M4626 Osteomyelitis of vertebra, lumbar region: Secondary | ICD-10-CM | POA: Diagnosis not present

## 2023-08-16 DIAGNOSIS — G8929 Other chronic pain: Secondary | ICD-10-CM | POA: Diagnosis not present

## 2023-08-18 DIAGNOSIS — M4626 Osteomyelitis of vertebra, lumbar region: Secondary | ICD-10-CM | POA: Diagnosis not present

## 2023-08-19 DIAGNOSIS — M47817 Spondylosis without myelopathy or radiculopathy, lumbosacral region: Secondary | ICD-10-CM | POA: Diagnosis not present

## 2023-08-19 DIAGNOSIS — M47816 Spondylosis without myelopathy or radiculopathy, lumbar region: Secondary | ICD-10-CM | POA: Diagnosis not present

## 2023-08-19 DIAGNOSIS — M4626 Osteomyelitis of vertebra, lumbar region: Secondary | ICD-10-CM | POA: Diagnosis not present

## 2023-08-19 DIAGNOSIS — M47818 Spondylosis without myelopathy or radiculopathy, sacral and sacrococcygeal region: Secondary | ICD-10-CM | POA: Diagnosis not present

## 2023-08-19 DIAGNOSIS — G8929 Other chronic pain: Secondary | ICD-10-CM | POA: Diagnosis not present

## 2023-08-19 DIAGNOSIS — E039 Hypothyroidism, unspecified: Secondary | ICD-10-CM | POA: Diagnosis not present

## 2023-08-19 DIAGNOSIS — E785 Hyperlipidemia, unspecified: Secondary | ICD-10-CM | POA: Diagnosis not present

## 2023-08-19 DIAGNOSIS — I10 Essential (primary) hypertension: Secondary | ICD-10-CM | POA: Diagnosis not present

## 2023-08-19 DIAGNOSIS — M4646 Discitis, unspecified, lumbar region: Secondary | ICD-10-CM | POA: Diagnosis not present

## 2023-08-22 DIAGNOSIS — G8929 Other chronic pain: Secondary | ICD-10-CM | POA: Diagnosis not present

## 2023-08-22 DIAGNOSIS — I10 Essential (primary) hypertension: Secondary | ICD-10-CM | POA: Diagnosis not present

## 2023-08-22 DIAGNOSIS — M47816 Spondylosis without myelopathy or radiculopathy, lumbar region: Secondary | ICD-10-CM | POA: Diagnosis not present

## 2023-08-22 DIAGNOSIS — M4647 Discitis, unspecified, lumbosacral region: Secondary | ICD-10-CM | POA: Diagnosis not present

## 2023-08-22 DIAGNOSIS — E785 Hyperlipidemia, unspecified: Secondary | ICD-10-CM | POA: Diagnosis not present

## 2023-08-22 DIAGNOSIS — M4626 Osteomyelitis of vertebra, lumbar region: Secondary | ICD-10-CM | POA: Diagnosis not present

## 2023-08-22 DIAGNOSIS — M47817 Spondylosis without myelopathy or radiculopathy, lumbosacral region: Secondary | ICD-10-CM | POA: Diagnosis not present

## 2023-08-22 DIAGNOSIS — M47818 Spondylosis without myelopathy or radiculopathy, sacral and sacrococcygeal region: Secondary | ICD-10-CM | POA: Diagnosis not present

## 2023-08-22 DIAGNOSIS — M4646 Discitis, unspecified, lumbar region: Secondary | ICD-10-CM | POA: Diagnosis not present

## 2023-08-22 DIAGNOSIS — E039 Hypothyroidism, unspecified: Secondary | ICD-10-CM | POA: Diagnosis not present

## 2023-08-23 LAB — LAB REPORT - SCANNED: EGFR: 94

## 2023-08-24 ENCOUNTER — Other Ambulatory Visit: Payer: Self-pay

## 2023-08-24 ENCOUNTER — Encounter: Payer: Self-pay | Admitting: Internal Medicine

## 2023-08-24 ENCOUNTER — Telehealth: Payer: Self-pay

## 2023-08-24 ENCOUNTER — Ambulatory Visit: Payer: Medicare PPO | Admitting: Internal Medicine

## 2023-08-24 VITALS — BP 136/82 | HR 92 | Resp 16 | Ht 60.0 in | Wt 132.0 lb

## 2023-08-24 DIAGNOSIS — M4626 Osteomyelitis of vertebra, lumbar region: Secondary | ICD-10-CM

## 2023-08-24 DIAGNOSIS — Z5181 Encounter for therapeutic drug level monitoring: Secondary | ICD-10-CM | POA: Insufficient documentation

## 2023-08-24 DIAGNOSIS — Z452 Encounter for adjustment and management of vascular access device: Secondary | ICD-10-CM | POA: Insufficient documentation

## 2023-08-24 NOTE — Assessment & Plan Note (Signed)
Creatinine and LFTs within normal limits.  Her CK level was mildly elevated 240 recently.  Will continue to monitor.  No Myalgias

## 2023-08-24 NOTE — Assessment & Plan Note (Signed)
PICC line is working well no concerns.  Will have this removed after her last dose on February 10.

## 2023-08-24 NOTE — Progress Notes (Signed)
   Subjective:    Patient ID: Stacey Coleman, female    DOB: 06-Feb-1951, 73 y.o.   MRN: [id number]  HPI He is here for hospital follow-up of osteomyelitis/discitis. She had 2 different MRIs done by her orthopedist with interval changes between the initial 1 in October and then the follow-up 1 in December which was concerning for discitis/osteomyelitis.  She was sent in the hospital for evaluation and underwent disc aspiration.  Unfortunately the aspiration culture remain negative and she was sent out on IV daptomycin plus ceftriaxone for 6 weeks through February 10.  She states that her back pain is feeling significantly improved.  She is having no particular issues with the antibiotics including no rash or diarrhea.   Review of Systems  Constitutional:  Negative for fatigue and fever.  Gastrointestinal:  Negative for diarrhea and nausea.  Skin:  Negative for rash.       Objective:   Physical Exam Eyes:     General: No scleral icterus. Pulmonary:     Effort: Pulmonary effort is normal.  Neurological:     Mental Status: She is alert.   SH: no tobacco        Assessment & Plan:

## 2023-08-24 NOTE — Assessment & Plan Note (Signed)
She is feeling better and it seems that she is having improvement because of the antibiotics and she has a true infection.  Her inflammatory markers from the beginning have been flat so no particular trend as they have remained normal or near normal.  At this point, I feel she is making good progress and will continue with the plan of the IV antibiotics for the 6 weeks through February 10 and then stop after that and observe off antibiotics.  She will turn about 2 weeks after completing and I can recheck her sed rate and CRP in her clinical progress.  I did encourage her to continue with activities, particularly after removal of the PICC line.

## 2023-08-24 NOTE — Telephone Encounter (Signed)
  Per provider ok to PULL PICC after End Date.   Provider:Dr Comer  End Date:09/05/23   Notified [facility] and [facility] to contact Home Health Nurse.

## 2023-08-25 DIAGNOSIS — E7800 Pure hypercholesterolemia, unspecified: Secondary | ICD-10-CM | POA: Diagnosis not present

## 2023-08-25 DIAGNOSIS — J387 Other diseases of larynx: Secondary | ICD-10-CM | POA: Diagnosis not present

## 2023-08-25 DIAGNOSIS — I10 Essential (primary) hypertension: Secondary | ICD-10-CM | POA: Diagnosis not present

## 2023-08-25 DIAGNOSIS — M545 Low back pain, unspecified: Secondary | ICD-10-CM | POA: Diagnosis not present

## 2023-08-25 DIAGNOSIS — R1319 Other dysphagia: Secondary | ICD-10-CM | POA: Diagnosis not present

## 2023-08-25 DIAGNOSIS — M85851 Other specified disorders of bone density and structure, right thigh: Secondary | ICD-10-CM | POA: Diagnosis not present

## 2023-08-25 DIAGNOSIS — E039 Hypothyroidism, unspecified: Secondary | ICD-10-CM | POA: Diagnosis not present

## 2023-08-25 DIAGNOSIS — G4700 Insomnia, unspecified: Secondary | ICD-10-CM | POA: Diagnosis not present

## 2023-08-25 DIAGNOSIS — M4626 Osteomyelitis of vertebra, lumbar region: Secondary | ICD-10-CM | POA: Diagnosis not present

## 2023-08-26 DIAGNOSIS — M47818 Spondylosis without myelopathy or radiculopathy, sacral and sacrococcygeal region: Secondary | ICD-10-CM | POA: Diagnosis not present

## 2023-08-26 DIAGNOSIS — E785 Hyperlipidemia, unspecified: Secondary | ICD-10-CM | POA: Diagnosis not present

## 2023-08-26 DIAGNOSIS — M4626 Osteomyelitis of vertebra, lumbar region: Secondary | ICD-10-CM | POA: Diagnosis not present

## 2023-08-26 DIAGNOSIS — G8929 Other chronic pain: Secondary | ICD-10-CM | POA: Diagnosis not present

## 2023-08-26 DIAGNOSIS — M4646 Discitis, unspecified, lumbar region: Secondary | ICD-10-CM | POA: Diagnosis not present

## 2023-08-26 DIAGNOSIS — I10 Essential (primary) hypertension: Secondary | ICD-10-CM | POA: Diagnosis not present

## 2023-08-26 DIAGNOSIS — E039 Hypothyroidism, unspecified: Secondary | ICD-10-CM | POA: Diagnosis not present

## 2023-08-26 DIAGNOSIS — M47817 Spondylosis without myelopathy or radiculopathy, lumbosacral region: Secondary | ICD-10-CM | POA: Diagnosis not present

## 2023-08-26 DIAGNOSIS — M47816 Spondylosis without myelopathy or radiculopathy, lumbar region: Secondary | ICD-10-CM | POA: Diagnosis not present

## 2023-08-29 DIAGNOSIS — M4647 Discitis, unspecified, lumbosacral region: Secondary | ICD-10-CM | POA: Diagnosis not present

## 2023-08-29 DIAGNOSIS — I10 Essential (primary) hypertension: Secondary | ICD-10-CM | POA: Diagnosis not present

## 2023-08-29 DIAGNOSIS — M47817 Spondylosis without myelopathy or radiculopathy, lumbosacral region: Secondary | ICD-10-CM | POA: Diagnosis not present

## 2023-08-29 DIAGNOSIS — G8929 Other chronic pain: Secondary | ICD-10-CM | POA: Diagnosis not present

## 2023-08-29 DIAGNOSIS — E785 Hyperlipidemia, unspecified: Secondary | ICD-10-CM | POA: Diagnosis not present

## 2023-08-29 DIAGNOSIS — Z961 Presence of intraocular lens: Secondary | ICD-10-CM | POA: Diagnosis not present

## 2023-08-29 DIAGNOSIS — M47818 Spondylosis without myelopathy or radiculopathy, sacral and sacrococcygeal region: Secondary | ICD-10-CM | POA: Diagnosis not present

## 2023-08-29 DIAGNOSIS — M4626 Osteomyelitis of vertebra, lumbar region: Secondary | ICD-10-CM | POA: Diagnosis not present

## 2023-08-29 DIAGNOSIS — M47816 Spondylosis without myelopathy or radiculopathy, lumbar region: Secondary | ICD-10-CM | POA: Diagnosis not present

## 2023-08-29 DIAGNOSIS — M4646 Discitis, unspecified, lumbar region: Secondary | ICD-10-CM | POA: Diagnosis not present

## 2023-08-29 DIAGNOSIS — H524 Presbyopia: Secondary | ICD-10-CM | POA: Diagnosis not present

## 2023-08-29 DIAGNOSIS — E039 Hypothyroidism, unspecified: Secondary | ICD-10-CM | POA: Diagnosis not present

## 2023-08-29 DIAGNOSIS — H04123 Dry eye syndrome of bilateral lacrimal glands: Secondary | ICD-10-CM | POA: Diagnosis not present

## 2023-08-30 LAB — LAB REPORT - SCANNED
Calcium: 9.6
EGFR: 93

## 2023-08-31 DIAGNOSIS — M4626 Osteomyelitis of vertebra, lumbar region: Secondary | ICD-10-CM | POA: Diagnosis not present

## 2023-09-01 DIAGNOSIS — M47818 Spondylosis without myelopathy or radiculopathy, sacral and sacrococcygeal region: Secondary | ICD-10-CM | POA: Diagnosis not present

## 2023-09-01 DIAGNOSIS — M4626 Osteomyelitis of vertebra, lumbar region: Secondary | ICD-10-CM | POA: Diagnosis not present

## 2023-09-01 DIAGNOSIS — E039 Hypothyroidism, unspecified: Secondary | ICD-10-CM | POA: Diagnosis not present

## 2023-09-01 DIAGNOSIS — E785 Hyperlipidemia, unspecified: Secondary | ICD-10-CM | POA: Diagnosis not present

## 2023-09-01 DIAGNOSIS — M47816 Spondylosis without myelopathy or radiculopathy, lumbar region: Secondary | ICD-10-CM | POA: Diagnosis not present

## 2023-09-01 DIAGNOSIS — M47817 Spondylosis without myelopathy or radiculopathy, lumbosacral region: Secondary | ICD-10-CM | POA: Diagnosis not present

## 2023-09-01 DIAGNOSIS — G8929 Other chronic pain: Secondary | ICD-10-CM | POA: Diagnosis not present

## 2023-09-01 DIAGNOSIS — M4646 Discitis, unspecified, lumbar region: Secondary | ICD-10-CM | POA: Diagnosis not present

## 2023-09-01 DIAGNOSIS — I10 Essential (primary) hypertension: Secondary | ICD-10-CM | POA: Diagnosis not present

## 2023-09-05 DIAGNOSIS — G8929 Other chronic pain: Secondary | ICD-10-CM | POA: Diagnosis not present

## 2023-09-05 DIAGNOSIS — E785 Hyperlipidemia, unspecified: Secondary | ICD-10-CM | POA: Diagnosis not present

## 2023-09-05 DIAGNOSIS — M47816 Spondylosis without myelopathy or radiculopathy, lumbar region: Secondary | ICD-10-CM | POA: Diagnosis not present

## 2023-09-05 DIAGNOSIS — M4626 Osteomyelitis of vertebra, lumbar region: Secondary | ICD-10-CM | POA: Diagnosis not present

## 2023-09-05 DIAGNOSIS — M47818 Spondylosis without myelopathy or radiculopathy, sacral and sacrococcygeal region: Secondary | ICD-10-CM | POA: Diagnosis not present

## 2023-09-05 DIAGNOSIS — M47817 Spondylosis without myelopathy or radiculopathy, lumbosacral region: Secondary | ICD-10-CM | POA: Diagnosis not present

## 2023-09-05 DIAGNOSIS — M4646 Discitis, unspecified, lumbar region: Secondary | ICD-10-CM | POA: Diagnosis not present

## 2023-09-05 DIAGNOSIS — E039 Hypothyroidism, unspecified: Secondary | ICD-10-CM | POA: Diagnosis not present

## 2023-09-05 DIAGNOSIS — I10 Essential (primary) hypertension: Secondary | ICD-10-CM | POA: Diagnosis not present

## 2023-09-05 DIAGNOSIS — Z79899 Other long term (current) drug therapy: Secondary | ICD-10-CM | POA: Diagnosis not present

## 2023-09-06 LAB — LAB REPORT - SCANNED
Calcium: 9.6
EGFR: 93

## 2023-09-07 DIAGNOSIS — Z96611 Presence of right artificial shoulder joint: Secondary | ICD-10-CM | POA: Diagnosis not present

## 2023-09-07 DIAGNOSIS — Z471 Aftercare following joint replacement surgery: Secondary | ICD-10-CM | POA: Diagnosis not present

## 2023-09-08 DIAGNOSIS — E039 Hypothyroidism, unspecified: Secondary | ICD-10-CM | POA: Diagnosis not present

## 2023-09-08 DIAGNOSIS — M47817 Spondylosis without myelopathy or radiculopathy, lumbosacral region: Secondary | ICD-10-CM | POA: Diagnosis not present

## 2023-09-08 DIAGNOSIS — M4626 Osteomyelitis of vertebra, lumbar region: Secondary | ICD-10-CM | POA: Diagnosis not present

## 2023-09-08 DIAGNOSIS — G8929 Other chronic pain: Secondary | ICD-10-CM | POA: Diagnosis not present

## 2023-09-08 DIAGNOSIS — M47816 Spondylosis without myelopathy or radiculopathy, lumbar region: Secondary | ICD-10-CM | POA: Diagnosis not present

## 2023-09-08 DIAGNOSIS — M4646 Discitis, unspecified, lumbar region: Secondary | ICD-10-CM | POA: Diagnosis not present

## 2023-09-08 DIAGNOSIS — E785 Hyperlipidemia, unspecified: Secondary | ICD-10-CM | POA: Diagnosis not present

## 2023-09-08 DIAGNOSIS — M47818 Spondylosis without myelopathy or radiculopathy, sacral and sacrococcygeal region: Secondary | ICD-10-CM | POA: Diagnosis not present

## 2023-09-08 DIAGNOSIS — I10 Essential (primary) hypertension: Secondary | ICD-10-CM | POA: Diagnosis not present

## 2023-09-12 DIAGNOSIS — M47818 Spondylosis without myelopathy or radiculopathy, sacral and sacrococcygeal region: Secondary | ICD-10-CM | POA: Diagnosis not present

## 2023-09-12 DIAGNOSIS — I10 Essential (primary) hypertension: Secondary | ICD-10-CM | POA: Diagnosis not present

## 2023-09-12 DIAGNOSIS — M4646 Discitis, unspecified, lumbar region: Secondary | ICD-10-CM | POA: Diagnosis not present

## 2023-09-12 DIAGNOSIS — M47816 Spondylosis without myelopathy or radiculopathy, lumbar region: Secondary | ICD-10-CM | POA: Diagnosis not present

## 2023-09-12 DIAGNOSIS — G8929 Other chronic pain: Secondary | ICD-10-CM | POA: Diagnosis not present

## 2023-09-12 DIAGNOSIS — M47817 Spondylosis without myelopathy or radiculopathy, lumbosacral region: Secondary | ICD-10-CM | POA: Diagnosis not present

## 2023-09-12 DIAGNOSIS — E039 Hypothyroidism, unspecified: Secondary | ICD-10-CM | POA: Diagnosis not present

## 2023-09-12 DIAGNOSIS — M4626 Osteomyelitis of vertebra, lumbar region: Secondary | ICD-10-CM | POA: Diagnosis not present

## 2023-09-12 DIAGNOSIS — E785 Hyperlipidemia, unspecified: Secondary | ICD-10-CM | POA: Diagnosis not present

## 2023-09-13 DIAGNOSIS — M47818 Spondylosis without myelopathy or radiculopathy, sacral and sacrococcygeal region: Secondary | ICD-10-CM | POA: Diagnosis not present

## 2023-09-13 DIAGNOSIS — M47817 Spondylosis without myelopathy or radiculopathy, lumbosacral region: Secondary | ICD-10-CM | POA: Diagnosis not present

## 2023-09-13 DIAGNOSIS — M47816 Spondylosis without myelopathy or radiculopathy, lumbar region: Secondary | ICD-10-CM | POA: Diagnosis not present

## 2023-09-13 DIAGNOSIS — G8929 Other chronic pain: Secondary | ICD-10-CM | POA: Diagnosis not present

## 2023-09-13 DIAGNOSIS — M4646 Discitis, unspecified, lumbar region: Secondary | ICD-10-CM | POA: Diagnosis not present

## 2023-09-13 DIAGNOSIS — E785 Hyperlipidemia, unspecified: Secondary | ICD-10-CM | POA: Diagnosis not present

## 2023-09-13 DIAGNOSIS — M4626 Osteomyelitis of vertebra, lumbar region: Secondary | ICD-10-CM | POA: Diagnosis not present

## 2023-09-13 DIAGNOSIS — E039 Hypothyroidism, unspecified: Secondary | ICD-10-CM | POA: Diagnosis not present

## 2023-09-13 DIAGNOSIS — I10 Essential (primary) hypertension: Secondary | ICD-10-CM | POA: Diagnosis not present

## 2023-09-21 DIAGNOSIS — M4626 Osteomyelitis of vertebra, lumbar region: Secondary | ICD-10-CM | POA: Diagnosis not present

## 2023-09-21 DIAGNOSIS — M4646 Discitis, unspecified, lumbar region: Secondary | ICD-10-CM | POA: Diagnosis not present

## 2023-09-21 DIAGNOSIS — M47816 Spondylosis without myelopathy or radiculopathy, lumbar region: Secondary | ICD-10-CM | POA: Diagnosis not present

## 2023-09-21 DIAGNOSIS — E039 Hypothyroidism, unspecified: Secondary | ICD-10-CM | POA: Diagnosis not present

## 2023-09-21 DIAGNOSIS — I10 Essential (primary) hypertension: Secondary | ICD-10-CM | POA: Diagnosis not present

## 2023-09-21 DIAGNOSIS — M47817 Spondylosis without myelopathy or radiculopathy, lumbosacral region: Secondary | ICD-10-CM | POA: Diagnosis not present

## 2023-09-21 DIAGNOSIS — M47818 Spondylosis without myelopathy or radiculopathy, sacral and sacrococcygeal region: Secondary | ICD-10-CM | POA: Diagnosis not present

## 2023-09-21 DIAGNOSIS — G8929 Other chronic pain: Secondary | ICD-10-CM | POA: Diagnosis not present

## 2023-09-21 DIAGNOSIS — E785 Hyperlipidemia, unspecified: Secondary | ICD-10-CM | POA: Diagnosis not present

## 2023-09-28 ENCOUNTER — Encounter: Payer: Self-pay | Admitting: Internal Medicine

## 2023-09-28 ENCOUNTER — Ambulatory Visit: Payer: Medicare PPO | Admitting: Internal Medicine

## 2023-09-28 ENCOUNTER — Other Ambulatory Visit: Payer: Self-pay

## 2023-09-28 VITALS — BP 126/80 | HR 94 | Temp 97.9°F | Ht 60.0 in | Wt 150.0 lb

## 2023-09-28 DIAGNOSIS — M4626 Osteomyelitis of vertebra, lumbar region: Secondary | ICD-10-CM

## 2023-09-28 NOTE — Assessment & Plan Note (Signed)
 She is doing well with no new significant pain.  She is increasing her exercise and tolerating that well.  I will check her inflammatory markers again today to assure they are flat.  I did discuss that she has no limitations and to continue to exercise, keep good posture, strengthen her core and keep at a good weight.  Discussed return precautions including new significant pain similar as before.  Otherwise she can return as needed  I have personally spent 30 minutes involved in face-to-face and non-face-to-face activities for this patient on the day of the visit. Professional time spent includes the following activities: Preparing to see the patient (review of tests), Obtaining and/or reviewing separately obtained history (admission/discharge record), Performing a medically appropriate examination and/or evaluation , Ordering medications/tests/procedures, referring and communicating with other health care professionals, Documenting clinical information in the EMR, Independently interpreting results (not separately reported), Communicating results to the patient/family/caregiver, Counseling and educating the patient/family/caregiver and Care coordination (not separately reported).

## 2023-09-28 NOTE — Progress Notes (Signed)
   Subjective:    Patient ID: Stacey Coleman, female    DOB: 1950/12/30, 73 y.o.   MRN: [id number]  HPI Stacey Coleman here for follow-up of discitis/osteomyelitis. She had 2 different MRIs done by her orthopedist with interval changes between the initial 1 in October and then the follow-up 1 in December which was concerning for discitis/osteomyelitis.  She was sent in the hospital for evaluation and underwent disc aspiration.  Unfortunately the aspiration culture remain negative and she was sent out on IV daptomycin plus ceftriaxone for 6 weeks through February 10.  Feeling better last visit and completed the antibiotics on the 10th.  No rash or diarrhea   Review of Systems  Constitutional:  Negative for fatigue.  Gastrointestinal:  Negative for diarrhea and nausea.  Skin:  Negative for rash.       Objective:   Physical Exam Eyes:     General: No scleral icterus. Pulmonary:     Effort: Pulmonary effort is normal.  Neurological:     Mental Status: She is alert.   SH: no tobacco        Assessment & Plan:

## 2023-09-29 DIAGNOSIS — M25511 Pain in right shoulder: Secondary | ICD-10-CM | POA: Diagnosis not present

## 2023-09-29 LAB — SEDIMENTATION RATE: Sed Rate: 9 mm/h (ref 0–30)

## 2023-09-29 LAB — C-REACTIVE PROTEIN: CRP: 7.2 mg/L (ref <8.0)

## 2023-09-30 DIAGNOSIS — M8588 Other specified disorders of bone density and structure, other site: Secondary | ICD-10-CM | POA: Diagnosis not present

## 2023-10-05 DIAGNOSIS — M65332 Trigger finger, left middle finger: Secondary | ICD-10-CM | POA: Diagnosis not present

## 2023-10-05 DIAGNOSIS — M65331 Trigger finger, right middle finger: Secondary | ICD-10-CM | POA: Diagnosis not present

## 2023-10-06 ENCOUNTER — Other Ambulatory Visit: Payer: Self-pay | Admitting: Orthopedic Surgery

## 2023-10-06 DIAGNOSIS — Z1231 Encounter for screening mammogram for malignant neoplasm of breast: Secondary | ICD-10-CM | POA: Diagnosis not present

## 2023-10-11 DIAGNOSIS — M25511 Pain in right shoulder: Secondary | ICD-10-CM | POA: Diagnosis not present

## 2023-10-21 DIAGNOSIS — M25511 Pain in right shoulder: Secondary | ICD-10-CM | POA: Diagnosis not present

## 2023-10-24 ENCOUNTER — Other Ambulatory Visit: Payer: Self-pay

## 2023-10-24 ENCOUNTER — Encounter (HOSPITAL_BASED_OUTPATIENT_CLINIC_OR_DEPARTMENT_OTHER): Payer: Self-pay | Admitting: Orthopedic Surgery

## 2023-10-24 NOTE — Progress Notes (Signed)
   10/24/23 1023  Pre-op Phone Call  Surgery Date Verified 10/28/23  Arrival Time Verified 1300  Surgery Location Verified [facility] [place]  Medical History Reviewed Yes  Is the patient taking a GLP-1 receptor agonist? No  Does the patient have diabetes? Pre-diabetes  Do you have a history of heart problems? No  Does patient have other implanted devices? No  Patient Teaching Enhanced Recovery;Pre / Post Procedure  Patient educated about smoking cessation 24 hours prior to surgery. N/A Non-Smoker  Patient verbalizes understanding of bowel prep? N/A  THA/TKA patients only:  By your surgery date, will you have been taking narcotics for 90 days or greater? No  Med Rec Completed Yes  Take the Following Meds the Morning of Surgery Amlodipine, levothyroxine, crestor  Recent  Lab Work, EKG, CXR? No  NPO (Including gum & candy) After midnight  Allowed clear liquids Water;Gatorade  (diabetics please choose diet or no sugar options)  Patient instructed to stop clear liquids including Carb loading drink at: 1100  Did patient view EMMI videos? No  Responsible adult to drive and be with you for 24 hours? Yes  Name & Phone Number for Ride/Caregiver Dianne D  No Jewelry, money, nail polish or make-up.  No lotions, powders, perfumes. No shaving  48 hrs. prior to surgery. Yes  Contacts, Dentures & Glasses Will Have to be Removed Before OR. Yes  Please bring your ID and Insurance Card the morning of your surgery. (Surgery Centers Only) Yes  Bring any papers or x-rays with you that your surgeon gave you. Yes  Instructed to contact the location of procedure/ provider if they or anyone in their household develops symptoms or tests positive for COVID-19, has close contact with someone who tests positive for COVID, or has known exposure to any contagious illness. Yes  Call this number the morning of surgery  with any problems that may cancel your surgery. [phone]  Covid-19 Assessment  Have you had a positive  COVID-19 test within the previous 90 days? No  COVID Testing Guidance Proceed with the additional questions.  Patient's surgery required a COVID-19 test (cardiothoracic, complex ENT, and bronchoscopies/ EBUS) No  Have you been unmasked and in close contact with anyone with COVID-19 or COVID-19 symptoms within the past 10 days? No  Do you or anyone in your household currently have any COVID-19 symptoms? No

## 2023-10-25 ENCOUNTER — Encounter (HOSPITAL_BASED_OUTPATIENT_CLINIC_OR_DEPARTMENT_OTHER)
Admission: RE | Admit: 2023-10-25 | Discharge: 2023-10-25 | Disposition: A | Discharge status: Home / Self Care | Source: Ambulatory Visit | Attending: Orthopedic Surgery | Admitting: Orthopedic Surgery

## 2023-10-25 DIAGNOSIS — M25561 Pain in right knee: Secondary | ICD-10-CM | POA: Diagnosis not present

## 2023-10-25 DIAGNOSIS — Z01812 Encounter for preprocedural laboratory examination: Secondary | ICD-10-CM | POA: Insufficient documentation

## 2023-10-25 DIAGNOSIS — Z01818 Encounter for other preprocedural examination: Secondary | ICD-10-CM | POA: Diagnosis present

## 2023-10-25 LAB — BASIC METABOLIC PANEL WITH GFR
Anion gap: 9 (ref 5–15)
BUN: 11 mg/dL (ref 8–23)
CO2: 21 mmol/L — ABNORMAL LOW (ref 22–32)
Calcium: 9.4 mg/dL (ref 8.9–10.3)
Chloride: 107 mmol/L (ref 98–111)
Creatinine, Ser: 0.71 mg/dL (ref 0.44–1.00)
GFR, Estimated: >60 mL/min (ref >60)
Glucose, Bld: 106 mg/dL — ABNORMAL HIGH (ref 70–99)
Potassium: 4.4 mmol/L (ref 3.5–5.1)
Sodium: 137 mmol/L (ref 135–145)

## 2023-10-25 NOTE — Progress Notes (Signed)

## 2023-10-28 ENCOUNTER — Ambulatory Visit (HOSPITAL_BASED_OUTPATIENT_CLINIC_OR_DEPARTMENT_OTHER)
Admission: RE | Admit: 2023-10-28 | Discharge: 2023-10-28 | Disposition: A | Discharge status: Home / Self Care | Attending: Orthopedic Surgery | Admitting: Orthopedic Surgery

## 2023-10-28 ENCOUNTER — Ambulatory Visit (HOSPITAL_BASED_OUTPATIENT_CLINIC_OR_DEPARTMENT_OTHER): Admitting: Anesthesiology

## 2023-10-28 ENCOUNTER — Encounter (HOSPITAL_BASED_OUTPATIENT_CLINIC_OR_DEPARTMENT_OTHER)
Admission: RE | Disposition: A | Discharge status: Home / Self Care | Payer: Self-pay | Source: Home / Self Care | Attending: Orthopedic Surgery

## 2023-10-28 DIAGNOSIS — Z01818 Encounter for other preprocedural examination: Secondary | ICD-10-CM

## 2023-10-28 DIAGNOSIS — K449 Diaphragmatic hernia without obstruction or gangrene: Secondary | ICD-10-CM | POA: Insufficient documentation

## 2023-10-28 DIAGNOSIS — G4730 Sleep apnea, unspecified: Secondary | ICD-10-CM | POA: Diagnosis not present

## 2023-10-28 DIAGNOSIS — E039 Hypothyroidism, unspecified: Secondary | ICD-10-CM | POA: Diagnosis not present

## 2023-10-28 DIAGNOSIS — K219 Gastro-esophageal reflux disease without esophagitis: Secondary | ICD-10-CM | POA: Diagnosis not present

## 2023-10-28 DIAGNOSIS — M1990 Unspecified osteoarthritis, unspecified site: Secondary | ICD-10-CM | POA: Diagnosis not present

## 2023-10-28 DIAGNOSIS — I10 Essential (primary) hypertension: Secondary | ICD-10-CM | POA: Diagnosis not present

## 2023-10-28 DIAGNOSIS — M65332 Trigger finger, left middle finger: Secondary | ICD-10-CM | POA: Diagnosis not present

## 2023-10-28 DIAGNOSIS — Z79899 Other long term (current) drug therapy: Secondary | ICD-10-CM | POA: Diagnosis not present

## 2023-10-28 HISTORY — PX: TRIGGER FINGER RELEASE: SHX641

## 2023-10-28 SURGERY — RELEASE, A1 PULLEY, FOR TRIGGER FINGER
Anesthesia: Monitor Anesthesia Care | Site: Finger | Laterality: Left

## 2023-10-28 MED ORDER — HYDROCODONE-ACETAMINOPHEN 5-325 MG PO TABS: 1.0000 | ORAL_TABLET | Freq: Four times a day (QID) | ORAL | 0 refills | Status: AC | PRN

## 2023-10-28 MED ORDER — ONDANSETRON HCL 4 MG/2ML IJ SOLN
INTRAMUSCULAR | Status: DC | PRN
  Administered 2023-10-28: 4 mg via INTRAVENOUS

## 2023-10-28 MED ORDER — LIDOCAINE 2% (20 MG/ML) 5 ML SYRINGE
INTRAMUSCULAR | Status: DC | PRN
  Administered 2023-10-28: 40 mg via INTRAVENOUS

## 2023-10-28 MED ORDER — DROPERIDOL 2.5 MG/ML IJ SOLN: 0.6250 mg | Freq: Once | INTRAMUSCULAR | Status: DC | PRN

## 2023-10-28 MED ORDER — ACETAMINOPHEN 500 MG PO TABS
ORAL_TABLET | ORAL | Status: AC
  Filled 2023-10-28: qty 2

## 2023-10-28 MED ORDER — 0.9 % SODIUM CHLORIDE (POUR BTL) OPTIME
TOPICAL | Status: DC | PRN
Start: 2023-10-28 — End: 2023-10-28
  Administered 2023-10-28: 120 mL

## 2023-10-28 MED ORDER — FENTANYL CITRATE (PF) 100 MCG/2ML IJ SOLN
INTRAMUSCULAR | Status: AC
  Filled 2023-10-28: qty 2

## 2023-10-28 MED ORDER — CEFAZOLIN SODIUM-DEXTROSE 2-4 GM/100ML-% IV SOLN
INTRAVENOUS | Status: AC
  Filled 2023-10-28: qty 100

## 2023-10-28 MED ORDER — BUPIVACAINE HCL (PF) 0.25 % IJ SOLN
INTRAMUSCULAR | Status: DC | PRN
Start: 2023-10-28 — End: 2023-10-28
  Administered 2023-10-28: 4.5 mL

## 2023-10-28 MED ORDER — LIDOCAINE 2% (20 MG/ML) 5 ML SYRINGE
INTRAMUSCULAR | Status: AC
Start: 2023-10-28 — End: ?
  Filled 2023-10-28: qty 5

## 2023-10-28 MED ORDER — LACTATED RINGERS IV SOLN: INTRAVENOUS | Status: DC

## 2023-10-28 MED ORDER — ACETAMINOPHEN 500 MG PO TABS
1000.0000 mg | ORAL_TABLET | Freq: Once | ORAL | Status: AC
  Administered 2023-10-28: 1000 mg via ORAL

## 2023-10-28 MED ORDER — FENTANYL CITRATE (PF) 100 MCG/2ML IJ SOLN
INTRAMUSCULAR | Status: DC | PRN
  Administered 2023-10-28: 50 ug via INTRAVENOUS

## 2023-10-28 MED ORDER — LIDOCAINE HCL (PF) 1 % IJ SOLN
INTRAMUSCULAR | Status: DC | PRN
  Administered 2023-10-28: 4.5 mL

## 2023-10-28 MED ORDER — ONDANSETRON HCL 4 MG/2ML IJ SOLN
INTRAMUSCULAR | Status: AC
  Filled 2023-10-28: qty 2

## 2023-10-28 MED ORDER — FENTANYL CITRATE (PF) 100 MCG/2ML IJ SOLN: 25.0000 ug | INTRAMUSCULAR | Status: DC | PRN

## 2023-10-28 MED ORDER — PROPOFOL 10 MG/ML IV BOLUS
INTRAVENOUS | Status: DC | PRN
  Administered 2023-10-28: 50 mg via INTRAVENOUS
  Administered 2023-10-28: 25 mg via INTRAVENOUS
  Administered 2023-10-28: 150 ug/kg/min via INTRAVENOUS
  Administered 2023-10-28: 25 mg via INTRAVENOUS

## 2023-10-28 MED ORDER — CEFAZOLIN SODIUM-DEXTROSE 2-4 GM/100ML-% IV SOLN
2.0000 g | INTRAVENOUS | Status: AC
  Administered 2023-10-28: 2 g via INTRAVENOUS

## 2023-10-28 MED ORDER — LIDOCAINE HCL (PF) 1 % IJ SOLN
INTRAMUSCULAR | Status: AC
  Filled 2023-10-28: qty 30

## 2023-10-28 SURGICAL SUPPLY — 28 items
BLADE SURG 15 STRL LF DISP TIS (BLADE) ×2 IMPLANT
BNDG COHESIVE 2X5 TAN ST LF (GAUZE/BANDAGES/DRESSINGS) ×1 IMPLANT
BNDG ESMARK 4X9 LF (GAUZE/BANDAGES/DRESSINGS) IMPLANT
CHLORAPREP W/TINT 26 (MISCELLANEOUS) ×1 IMPLANT
CORD BIPOLAR FORCEPS 12FT (ELECTRODE) ×1 IMPLANT
COVER BACK TABLE 60X90IN (DRAPES) ×1 IMPLANT
COVER MAYO STAND STRL (DRAPES) ×1 IMPLANT
CUFF TOURN SGL QUICK 18X4 (TOURNIQUET CUFF) ×1 IMPLANT
DRAPE EXTREMITY T 121X128X90 (DISPOSABLE) ×1 IMPLANT
DRAPE SURG 17X23 STRL (DRAPES) ×1 IMPLANT
GAUZE SPONGE 4X4 12PLY STRL (GAUZE/BANDAGES/DRESSINGS) ×1 IMPLANT
GAUZE XEROFORM 1X8 LF (GAUZE/BANDAGES/DRESSINGS) ×1 IMPLANT
GLOVE BIO SURGEON STRL SZ 6.5 (GLOVE) IMPLANT
GLOVE BIO SURGEON STRL SZ7.5 (GLOVE) ×1 IMPLANT
GLOVE BIOGEL PI IND STRL 7.0 (GLOVE) IMPLANT
GLOVE BIOGEL PI IND STRL 8 (GLOVE) ×1 IMPLANT
GOWN STRL REUS W/ TWL LRG LVL3 (GOWN DISPOSABLE) ×1 IMPLANT
GOWN STRL REUS W/TWL XL LVL3 (GOWN DISPOSABLE) ×1 IMPLANT
NDL HYPO 25X1 1.5 SAFETY (NEEDLE) ×1 IMPLANT
NEEDLE HYPO 25X1 1.5 SAFETY (NEEDLE) ×1 IMPLANT
NS IRRIG 1000ML POUR BTL (IV SOLUTION) ×1 IMPLANT
PACK BASIN DAY SURGERY FS (CUSTOM PROCEDURE TRAY) ×1 IMPLANT
STOCKINETTE 4X48 STRL (DRAPES) ×1 IMPLANT
SUT ETHILON 4 0 PS 2 18 (SUTURE) ×1 IMPLANT
SYR BULB EAR ULCER 3OZ GRN STR (SYRINGE) ×1 IMPLANT
SYR CONTROL 10ML LL (SYRINGE) ×1 IMPLANT
TOWEL GREEN STERILE FF (TOWEL DISPOSABLE) ×2 IMPLANT
UNDERPAD 30X36 HEAVY ABSORB (UNDERPADS AND DIAPERS) ×1 IMPLANT

## 2023-10-28 NOTE — Op Note (Signed)
 10/28/2023 [facility]  Operative Note  PREOPERATIVE DIAGNOSIS: LEFT LONG FINGER TRIGGER DIGIT  POSTOPERATIVE DIAGNOSIS:  LEFT LONG FINGER TRIGGER DIGIT  PROCEDURE: Procedure(s): RELEASE, A1 PULLEY, FOR TRIGGER FINGER LEFT LONG FINGER   SURGEON:  Betha Loa, MD  ASSISTANT:  none.  ANESTHESIA:  Local with sedation.  IV FLUIDS:  Per anesthesia flow sheet.  ESTIMATED BLOOD LOSS:  Minimal.  COMPLICATIONS:  None.  SPECIMENS:  None.  TOURNIQUET TIME:  Total Tourniquet Time Documented: Forearm (Left) - 9 minutes Total: Forearm (Left) - 9 minutes   DISPOSITION:  Stable to PACU.  LOCATION: [facility]  INDICATIONS: Stacey Coleman is a 73 y.o. female with triggering of the long finger.  This has been injected without lasting resolution.  She wishes to proceed with surgical trigger release.  Risks, benefits and alternatives of surgery were discussed including the risk of blood loss, infection, damage to nerves, vessels, tendons, ligaments, bone, failure of surgery, need for additional surgery, complications with wound healing, continued pain, continued triggering and need for repeat surgery.  She voiced understanding of these risks and elected to proceed.  OPERATIVE COURSE:  After being identified preoperatively by myself, the patient and I agreed upon the procedure and site of procedure.  The surgical site was marked. Surgical consent had been signed. She was given IV Ancef as preoperative antibiotic prophylaxis. She was transported to the operating room and placed on the operating room table in supine position with the Left upper extremity on an arm board. Sedation was induced by the anesthesiologist.  A surgical pause was performed between surgeons, anesthesia, and operating room staff, and all were in agreement as to the patient, procedure, and site of procedure.  The surgical site was injected with half and half solution 1% plain lidocaine and 0.25% plain  marcaine.  The Left upper extremity was prepped and draped in normal sterile orthopedic fashion. A surgical pause was performed between surgeons, anesthesia, and operating room staff, and all were in agreement as to the patient, procedure, and site of procedure.  Tourniquet at the proximal aspect of the forearm was inflated to 250 mmHg after exsanguination of the arm with an Esmarch bandage.  An incision was made at the volar aspect of the MP joint of the long finger.  This was carried into the subcutaneous tissues by spreading technique.  Bipolar electrocautery was used to obtain hemostasis.  The radial and ulnar digital nerves were protected throughout the case. The flexor sheath was identified.  The A1 pulley was identified and sharply incised.  It was released in its entirety.  The proximal 1-2 mm of the A2 pulley was vented to allow better excursion of the tendons.  The finger was placed through a range of motion and there was noted to be no catching.  The tendons were brought through the wound and any adherences released.  The wound was then copiously irrigated with sterile saline. It was closed with 4-0 nylon in a horizontal mattress fashion.  It was injected with 0.25% plain Marcaine to aid in postoperative analgesia.  It was dressed with sterile Xeroform, 4x4s, and wrapped lightly with a Coban dressing.  Tourniquet was deflated at 9 minutes.  The fingertips were pink with brisk capillary refill after deflation of the tourniquet.  The operative drapes were broken down and the patient was awoken from anesthesia safely.  She was transferred back to the stretcher and taken to the PACU in stable condition.   I will  see her back in the office in 1 week for postoperative followup.  I will give her a prescription for Norco 5/325 1 tab PO q6 hours prn pain, dispense # 15.    Betha Loa, MD Electronically signed, 10/28/23

## 2023-10-28 NOTE — H&P (Signed)
 Stacey Coleman is an 73 y.o. female.   Chief Complaint: trigger digit HPI: 73 yo female with left long finger trigger digit.  This has been injected without lasting resolution.  She wishes to proceed with surgical trigger release.  Allergies:  Allergies  Allergen Reactions   Grass Pollen(K-O-R-T-Swt Vern) Other (See Comments)   Shellfish Allergy Other (See Comments)    Never had, she doesn't like to eat shrimp.    Strawberry (Diagnostic) Hives    Past Medical History:  Diagnosis Date   Anemia 2013   after multiple blood donations   Arthritis    lower back and left ankle, Follows w/ [name], MD orthopedics.   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 Tally Joe @ [facility].   Hypothyroid    Follows w/ PCP, Dr. Tally Joe @ [facility].   Laryngotracheal cleft    laryngotracheal reflux   Nasal congestion    nasal dripping   Osteoporosis    Follows w/ Dr. Cleophas Dunker, orthopedics.   Pre-diabetes    Follows w/ PCP. No meds.   Seasonal allergies    Sleep apnea    mild, no CPAP   Wears hearing aid    both ears   Wears partial dentures     Past Surgical History:  Procedure Laterality Date   ANKLE ARTHROSCOPY  2008   spurs-left   BLADDER SUSPENSION N/A 11/08/2022   Procedure: TRANSVAGINAL TAPE (TVT) PROCEDURE;  Surgeon: Marguerita Beards, MD;  Location: [facility];  Service: Gynecology;  Laterality: N/A;   BONE RESECTION  2004   left humerous   CLOSED REDUCTION FINGER WITH PERCUTANEOUS PINNING Right 10/30/2020   Procedure: CLOSED REDUCTION RIGHT SMALL PROXIMAL PHALANX FRACTURE OF  FINGER WITH PERCUTANEOUS PINNING;  Surgeon: Betha Loa, MD;  Location: [facility];  Service: Orthopedics;  Laterality: Right;    COLONOSCOPY     CYSTOCELE REPAIR N/A 11/08/2022   Procedure: ANTERIOR REPAIR (CYSTOCELE);  Surgeon: Marguerita Beards, MD;  Location: [facility];  Service: Gynecology;  Laterality: N/A;   CYSTOSCOPY N/A 11/08/2022   Procedure: CYSTOSCOPY;  Surgeon: Marguerita Beards, MD;  Location: [facility];  Service: Gynecology;  Laterality: N/A;   ESOPHAGOGASTRODUODENOSCOPY     hiatal hernia   IR FLUORO GUIDED NEEDLE PLC ASPIRATION/INJECTION LOC  07/25/2023   KNEE ARTHROSCOPY  1990   rt   KNEE ARTHROSCOPY  2002   rt   KNEE ARTHROSCOPY W/ ACL RECONSTRUCTION  2001   right   KNEE ARTHROSCOPY WITH MEDIAL MENISECTOMY Left 12/13/2013   Procedure: LEFT KNEE ARTHROSCOPY WITH MEDIAL MENISECTOMY, PLICA EXCISION, CHONDROPLASTY;  Surgeon: Loreta Ave, MD;  Location: [facility];  Service: Orthopedics;  Laterality: Left;   REVERSE SHOULDER ARTHROPLASTY Right 06/09/2023   Procedure: REVERSE SHOULDER ARTHROPLASTY;  Surgeon: Francena Hanly, MD;  Location: [facility];  Service: Orthopedics;  Laterality: Right;  interscalene block 110   SHOULDER SURGERY Left 2004   Removed chondro sarcoma   TOTAL KNEE ARTHROPLASTY Right 08/28/2014   Procedure: RIGHT TOTAL KNEE ARTHROPLASTY;  Surgeon: Loreta Ave, MD;  Location: [facility];  Service: Orthopedics;  Laterality: Right;   VAGINAL HYSTERECTOMY N/A 11/08/2022   Procedure: HYSTERECTOMY VAGINAL  with possible bilateral salpingo-oophorectomy;  Surgeon: Marguerita Beards, MD;  Location: [facility];  Service: Gynecology;  Laterality: N/A;   VAGINAL PROLAPSE REPAIR N/A 11/08/2022   Procedure: VAGINAL VAULT SUSPENSION;  Surgeon: Marguerita Beards, MD;  Location: [facility];  Service: Gynecology;  Laterality: N/A;    Family History: Family History  Problem Relation Age of Onset   Breast cancer Mother    Heart attack Father    Hypertension Father    Diabetes Brother    Heart attack Maternal  Grandfather    Colon cancer Paternal Grandfather    Diabetes Other     Social History:   reports that she has never smoked. She has never used smokeless tobacco. She reports current alcohol use. She reports that she does not use drugs.  Medications: Medications Prior to Admission  Medication Sig Dispense Refill   acetaminophen (TYLENOL) 500 MG tablet Take 1 tablet (500 mg total) by mouth every 6 (six) hours as needed (pain). (Patient taking differently: Take 500-1,000 mg by mouth every 6 (six) hours as needed (pain).) 30 tablet 0   amLODipine (NORVASC) 5 MG tablet Take 5 mg by mouth daily.     benazepril-hydrochlorthiazide (LOTENSIN HCT) 20-12.5 MG per tablet Take 1 tablet by mouth daily.     calcium carbonate (TUMS - DOSED IN MG ELEMENTAL CALCIUM) 500 MG chewable tablet Chew 1 tablet by mouth daily as needed for indigestion or heartburn.     Cholecalciferol (VITAMIN D3) 2000 units capsule Take 2,000 Units by mouth daily.     Cinnamon Extract 500 MG TABS Take 500 mg by mouth daily.     diclofenac Sodium (VOLTAREN) 1 % GEL Apply 2 g topically daily as needed (pain).     diphenhydramine-acetaminophen (TYLENOL PM) 25-500 MG TABS tablet Take 0.5 tablets by mouth at bedtime.     fexofenadine (ALLEGRA) 180 MG tablet Take 180 mg by mouth daily as needed for allergies (In the Spring).     HYDROcodone-acetaminophen (NORCO/VICODIN) 5-325 MG tablet Take 1 tablet by mouth every 6 (six) hours as needed for moderate pain (pain score 4-6).     Lactobacillus-Inulin (CULTURELLE DIGESTIVE DAILY PO) Take 1 capsule by mouth daily.     levothyroxine (SYNTHROID) 100 MCG tablet Take 100 mcg by mouth daily before breakfast.     methocarbamol (ROBAXIN) 500 MG tablet Take 1 tablet (500 mg total) by mouth every 6 (six) hours as needed for muscle spasms. 30 tablet 1   Omega-3 Fatty Acids (OMEGA-3 PO) Take 1,200 mg by mouth daily.     omeprazole (PRILOSEC) 20 MG capsule Take 20 mg by mouth daily.     OVER THE COUNTER  MEDICATION Apply 1 Application topically 2 (two) times daily as needed (Pain). Helichrysum essential oils     oxyCODONE (OXY IR/ROXICODONE) 5 MG immediate release tablet Take 0.5-1 tablets (2.5-5 mg total) by mouth every 4 (four) hours as needed for moderate pain (pain score 4-6) or severe pain (pain score 7-10). 20 tablet 0   Peppermint Oil (PEPOGEST PO) Take 1 capsule by mouth 3 (three) times daily with meals as needed (IBS).     Propylene Glycol (SYSTANE BALANCE) 0.6 % SOLN Place 1 drop into both eyes at bedtime.     rosuvastatin (CRESTOR) 5 MG tablet Take 5 mg by mouth daily.     triamcinolone (NASACORT) 55 MCG/ACT AERO nasal inhaler Place 2 sprays into both nostrils at bedtime.     vitamin k 100 MCG tablet  Take 100 mcg by mouth daily. K2     denosumab (PROLIA) 60 MG/ML SOSY injection Inject 60 mg into the skin every 6 (six) months. Next Injection 08/20/2023.     estradiol (ESTRACE) 0.1 MG/GM vaginal cream Place 0.5 g vaginally 2 (two) times a week. Place 0.5g nightly for two weeks then twice a week after 42.5 g 11    No results found for this or any previous visit (from the past 48 hours).  No results found.    Height 5' (1.524 m), weight 65.8 kg.  General appearance: alert, cooperative, and appears stated age Head: Normocephalic, without obvious abnormality, atraumatic Neck: supple, symmetrical, trachea midline Extremities: Intact sensation and capillary refill all digits.  +epl/fpl/io.  No wounds.  Skin: Skin color, texture, turgor normal. No rashes or lesions Neurologic: Grossly normal Incision/Wound: none  Assessment/Plan Left long finger trigger digit.  Non operative and operative treatment options have been discussed with the patient and patient wishes to proceed with operative treatment. Risks, benefits, and alternatives of surgery have been discussed and the patient agrees with the plan of care.   Betha Loa 10/28/2023, 1:19 PM

## 2023-10-28 NOTE — Anesthesia Procedure Notes (Signed)
 Procedure Name: MAC Date/Time: 10/28/2023 3:33 PM  Performed by: Yolanda Bonine, CRNAPre-anesthesia Checklist: Patient identified, Emergency Drugs available, Suction available, Patient being monitored and Timeout performed Oxygen Delivery Method: Simple face mask Airway Equipment and Method: Oral airway Placement Confirmation: positive ETCO2 Dental Injury: Teeth and Oropharynx as per pre-operative assessment

## 2023-10-28 NOTE — Discharge Instructions (Signed)

## 2023-10-28 NOTE — Transfer of Care (Addendum)
 Immediate Anesthesia Transfer of Care Note  Patient: Stacey Coleman  Procedure(s) Performed: RELEASE, A1 PULLEY, FOR TRIGGER FINGER LEFT LONG FINGER (Left: Finger)  Patient Location: PACU  Anesthesia Type:MAC  Level of Consciousness: alert   Airway & Oxygen Therapy: Patient Spontanous Breathing  Post-op Assessment: Report given to RN and Post -op Vital signs reviewed and stable  Post vital signs: Reviewed and stable  Last Vitals:  Vitals Value Taken Time  BP    Temp    Pulse    Resp    SpO2      Last Pain:  Vitals:   10/28/23 1600  TempSrc:   PainSc: 0-No pain      Patients Stated Pain Goal: 0 (10/28/23 1319)  Complications: No notable events documented.

## 2023-10-28 NOTE — Anesthesia Postprocedure Evaluation (Signed)
 Anesthesia Post Note  Patient: Stacey Coleman  Procedure(s) Performed: RELEASE, A1 PULLEY, FOR TRIGGER FINGER LEFT LONG FINGER (Left: Finger)     Patient location during evaluation: PACU Anesthesia Type: MAC Level of consciousness: awake and alert Pain management: pain level controlled Vital Signs Assessment: post-procedure vital signs reviewed and stable Respiratory status: spontaneous breathing, nonlabored ventilation, respiratory function stable and patient connected to nasal cannula oxygen Cardiovascular status: stable and blood pressure returned to baseline Postop Assessment: no apparent nausea or vomiting Anesthetic complications: no  No notable events documented.  Last Vitals:  Vitals:   10/28/23 1600 10/28/23 1626  BP: (!) 146/75 (!) 155/79  Pulse: 85 86  Resp: 15 16  Temp: 36.7 C (!) 36.4 C  SpO2: 94% 97%    Last Pain:  Vitals:   10/28/23 1626  TempSrc:   PainSc: 0-No pain                 Kennieth Rad

## 2023-10-28 NOTE — Anesthesia Preprocedure Evaluation (Signed)
 Anesthesia Evaluation  Patient identified by MRN, date of birth, ID band Patient awake    Reviewed: Allergy & Precautions, NPO status , Patient's Chart, lab work & pertinent test results  Airway Mallampati: II  TM Distance: >3 FB Neck ROM: Full    Dental  (+) Dental Advisory Given   Pulmonary sleep apnea    breath sounds clear to auscultation       Cardiovascular hypertension, Pt. on medications  Rhythm:Regular Rate:Normal     Neuro/Psych negative neurological ROS     GI/Hepatic Neg liver ROS, hiatal hernia,GERD  ,,  Endo/Other  Hypothyroidism    Renal/GU negative Renal ROS     Musculoskeletal  (+) Arthritis ,    Abdominal   Peds  Hematology negative hematology ROS (+)   Anesthesia Other Findings   Reproductive/Obstetrics                             Anesthesia Physical Anesthesia Plan  ASA: 2  Anesthesia Plan: MAC   Post-op Pain Management: Tylenol PO (pre-op)*   Induction:   PONV Risk Score and Plan: 2 and Propofol infusion and Ondansetron  Airway Management Planned: Natural Airway and Simple Face Mask  Additional Equipment:   Intra-op Plan:   Post-operative Plan:   Informed Consent: I have reviewed the patients History and Physical, chart, labs and discussed the procedure including the risks, benefits and alternatives for the proposed anesthesia with the patient or authorized representative who has indicated his/her understanding and acceptance.       Plan Discussed with: CRNA  Anesthesia Plan Comments:        Anesthesia Quick Evaluation

## 2023-10-29 ENCOUNTER — Encounter (HOSPITAL_BASED_OUTPATIENT_CLINIC_OR_DEPARTMENT_OTHER): Payer: Self-pay | Admitting: Orthopedic Surgery

## 2023-10-31 DIAGNOSIS — M25461 Effusion, right knee: Secondary | ICD-10-CM | POA: Diagnosis not present

## 2023-10-31 DIAGNOSIS — M25561 Pain in right knee: Secondary | ICD-10-CM | POA: Diagnosis not present

## 2023-11-02 DIAGNOSIS — M25511 Pain in right shoulder: Secondary | ICD-10-CM | POA: Diagnosis not present

## 2023-11-09 DIAGNOSIS — M25511 Pain in right shoulder: Secondary | ICD-10-CM | POA: Diagnosis not present

## 2023-11-12 DIAGNOSIS — M25551 Pain in right hip: Secondary | ICD-10-CM | POA: Diagnosis not present

## 2023-11-12 DIAGNOSIS — M545 Low back pain, unspecified: Secondary | ICD-10-CM | POA: Diagnosis not present

## 2023-11-12 DIAGNOSIS — M25552 Pain in left hip: Secondary | ICD-10-CM | POA: Diagnosis not present

## 2023-11-14 DIAGNOSIS — L820 Inflamed seborrheic keratosis: Secondary | ICD-10-CM | POA: Diagnosis not present

## 2023-11-14 DIAGNOSIS — L578 Other skin changes due to chronic exposure to nonionizing radiation: Secondary | ICD-10-CM | POA: Diagnosis not present

## 2023-11-14 DIAGNOSIS — D2271 Melanocytic nevi of right lower limb, including hip: Secondary | ICD-10-CM | POA: Diagnosis not present

## 2023-11-14 DIAGNOSIS — L814 Other melanin hyperpigmentation: Secondary | ICD-10-CM | POA: Diagnosis not present

## 2023-11-14 DIAGNOSIS — L821 Other seborrheic keratosis: Secondary | ICD-10-CM | POA: Diagnosis not present

## 2023-11-14 DIAGNOSIS — D225 Melanocytic nevi of trunk: Secondary | ICD-10-CM | POA: Diagnosis not present

## 2023-11-22 DIAGNOSIS — M5416 Radiculopathy, lumbar region: Secondary | ICD-10-CM | POA: Diagnosis not present

## 2023-12-02 DIAGNOSIS — R7303 Prediabetes: Secondary | ICD-10-CM | POA: Diagnosis not present

## 2023-12-02 DIAGNOSIS — Z0000 Encounter for general adult medical examination without abnormal findings: Secondary | ICD-10-CM | POA: Diagnosis not present

## 2023-12-02 DIAGNOSIS — M85851 Other specified disorders of bone density and structure, right thigh: Secondary | ICD-10-CM | POA: Diagnosis not present

## 2023-12-02 DIAGNOSIS — R1319 Other dysphagia: Secondary | ICD-10-CM | POA: Diagnosis not present

## 2023-12-02 DIAGNOSIS — E039 Hypothyroidism, unspecified: Secondary | ICD-10-CM | POA: Diagnosis not present

## 2023-12-02 DIAGNOSIS — G4700 Insomnia, unspecified: Secondary | ICD-10-CM | POA: Diagnosis not present

## 2023-12-02 DIAGNOSIS — M545 Low back pain, unspecified: Secondary | ICD-10-CM | POA: Diagnosis not present

## 2023-12-02 DIAGNOSIS — I10 Essential (primary) hypertension: Secondary | ICD-10-CM | POA: Diagnosis not present

## 2023-12-02 DIAGNOSIS — J387 Other diseases of larynx: Secondary | ICD-10-CM | POA: Diagnosis not present

## 2023-12-02 DIAGNOSIS — E7800 Pure hypercholesterolemia, unspecified: Secondary | ICD-10-CM | POA: Diagnosis not present

## 2023-12-03 DIAGNOSIS — M25552 Pain in left hip: Secondary | ICD-10-CM | POA: Diagnosis not present

## 2023-12-03 DIAGNOSIS — M545 Low back pain, unspecified: Secondary | ICD-10-CM | POA: Diagnosis not present

## 2023-12-03 DIAGNOSIS — M25551 Pain in right hip: Secondary | ICD-10-CM | POA: Diagnosis not present

## 2023-12-07 DIAGNOSIS — Z96611 Presence of right artificial shoulder joint: Secondary | ICD-10-CM | POA: Diagnosis not present

## 2023-12-09 DIAGNOSIS — M545 Low back pain, unspecified: Secondary | ICD-10-CM | POA: Diagnosis not present

## 2023-12-13 DIAGNOSIS — M25551 Pain in right hip: Secondary | ICD-10-CM | POA: Diagnosis not present

## 2023-12-13 DIAGNOSIS — M25552 Pain in left hip: Secondary | ICD-10-CM | POA: Diagnosis not present

## 2023-12-13 DIAGNOSIS — M545 Low back pain, unspecified: Secondary | ICD-10-CM | POA: Diagnosis not present

## 2023-12-26 DIAGNOSIS — M5416 Radiculopathy, lumbar region: Secondary | ICD-10-CM | POA: Diagnosis not present

## 2024-01-23 DIAGNOSIS — M810 Age-related osteoporosis without current pathological fracture: Secondary | ICD-10-CM | POA: Diagnosis not present

## 2024-01-26 DIAGNOSIS — M25552 Pain in left hip: Secondary | ICD-10-CM | POA: Diagnosis not present

## 2024-01-26 DIAGNOSIS — M25551 Pain in right hip: Secondary | ICD-10-CM | POA: Diagnosis not present

## 2024-01-26 DIAGNOSIS — M545 Low back pain, unspecified: Secondary | ICD-10-CM | POA: Diagnosis not present

## 2024-02-16 DIAGNOSIS — M545 Low back pain, unspecified: Secondary | ICD-10-CM | POA: Diagnosis not present

## 2024-02-16 DIAGNOSIS — M25551 Pain in right hip: Secondary | ICD-10-CM | POA: Diagnosis not present

## 2024-02-16 DIAGNOSIS — M25552 Pain in left hip: Secondary | ICD-10-CM | POA: Diagnosis not present

## 2024-03-08 DIAGNOSIS — M25551 Pain in right hip: Secondary | ICD-10-CM | POA: Diagnosis not present

## 2024-03-08 DIAGNOSIS — M545 Low back pain, unspecified: Secondary | ICD-10-CM | POA: Diagnosis not present

## 2024-03-08 DIAGNOSIS — M25552 Pain in left hip: Secondary | ICD-10-CM | POA: Diagnosis not present

## 2024-04-03 DIAGNOSIS — M8588 Other specified disorders of bone density and structure, other site: Secondary | ICD-10-CM | POA: Diagnosis not present

## 2024-04-03 DIAGNOSIS — M5416 Radiculopathy, lumbar region: Secondary | ICD-10-CM | POA: Diagnosis not present

## 2024-04-17 DIAGNOSIS — M25551 Pain in right hip: Secondary | ICD-10-CM | POA: Diagnosis not present

## 2024-04-17 DIAGNOSIS — M25552 Pain in left hip: Secondary | ICD-10-CM | POA: Diagnosis not present

## 2024-04-17 DIAGNOSIS — M545 Low back pain, unspecified: Secondary | ICD-10-CM | POA: Diagnosis not present

## 2024-06-07 DIAGNOSIS — I10 Essential (primary) hypertension: Secondary | ICD-10-CM | POA: Diagnosis not present

## 2024-06-07 DIAGNOSIS — E039 Hypothyroidism, unspecified: Secondary | ICD-10-CM | POA: Diagnosis not present

## 2024-06-07 DIAGNOSIS — M85851 Other specified disorders of bone density and structure, right thigh: Secondary | ICD-10-CM | POA: Diagnosis not present

## 2024-06-07 DIAGNOSIS — M545 Low back pain, unspecified: Secondary | ICD-10-CM | POA: Diagnosis not present

## 2024-06-07 DIAGNOSIS — M25549 Pain in joints of unspecified hand: Secondary | ICD-10-CM | POA: Diagnosis not present

## 2024-06-07 DIAGNOSIS — E7800 Pure hypercholesterolemia, unspecified: Secondary | ICD-10-CM | POA: Diagnosis not present

## 2024-06-07 DIAGNOSIS — J387 Other diseases of larynx: Secondary | ICD-10-CM | POA: Diagnosis not present

## 2024-06-07 DIAGNOSIS — G4700 Insomnia, unspecified: Secondary | ICD-10-CM | POA: Diagnosis not present

## 2024-06-07 DIAGNOSIS — R7303 Prediabetes: Secondary | ICD-10-CM | POA: Diagnosis not present

## 2024-06-18 DIAGNOSIS — Z471 Aftercare following joint replacement surgery: Secondary | ICD-10-CM | POA: Diagnosis not present

## 2024-06-18 DIAGNOSIS — Z96611 Presence of right artificial shoulder joint: Secondary | ICD-10-CM | POA: Diagnosis not present

## 2024-08-06 ENCOUNTER — Encounter: Payer: Self-pay | Admitting: *Deleted
